# Patient Record
Sex: Male | Born: 1950
Health system: Southern US, Community
[De-identification: ages and names within clinical notes are randomized; demographics above are authoritative.]

## PROBLEM LIST (undated history)

## (undated) DIAGNOSIS — M549 Dorsalgia, unspecified: Secondary | ICD-10-CM

## (undated) DIAGNOSIS — M4802 Spinal stenosis, cervical region: Secondary | ICD-10-CM

## (undated) DIAGNOSIS — R4586 Emotional lability: Secondary | ICD-10-CM

## (undated) DIAGNOSIS — G4700 Insomnia, unspecified: Secondary | ICD-10-CM

## (undated) DIAGNOSIS — M5030 Other cervical disc degeneration, unspecified cervical region: Secondary | ICD-10-CM

## (undated) DIAGNOSIS — K635 Polyp of colon: Secondary | ICD-10-CM

## (undated) DIAGNOSIS — N529 Male erectile dysfunction, unspecified: Secondary | ICD-10-CM

## (undated) DIAGNOSIS — G4733 Obstructive sleep apnea (adult) (pediatric): Secondary | ICD-10-CM

## (undated) DIAGNOSIS — E739 Lactose intolerance, unspecified: Secondary | ICD-10-CM

## (undated) DIAGNOSIS — R7301 Impaired fasting glucose: Secondary | ICD-10-CM

## (undated) DIAGNOSIS — E781 Pure hyperglyceridemia: Secondary | ICD-10-CM

## (undated) DIAGNOSIS — E559 Vitamin D deficiency, unspecified: Secondary | ICD-10-CM

## (undated) DIAGNOSIS — I480 Paroxysmal atrial fibrillation: Secondary | ICD-10-CM

## (undated) DIAGNOSIS — R3121 Asymptomatic microscopic hematuria: Secondary | ICD-10-CM

## (undated) DIAGNOSIS — L989 Disorder of the skin and subcutaneous tissue, unspecified: Secondary | ICD-10-CM

## (undated) DIAGNOSIS — G4730 Sleep apnea, unspecified: Secondary | ICD-10-CM

## (undated) DIAGNOSIS — M5136 Other intervertebral disc degeneration, lumbar region: Secondary | ICD-10-CM

## (undated) DIAGNOSIS — G8929 Other chronic pain: Secondary | ICD-10-CM

## (undated) DIAGNOSIS — A4902 Methicillin resistant Staphylococcus aureus infection, unspecified site: Secondary | ICD-10-CM

## (undated) DIAGNOSIS — M51369 Other intervertebral disc degeneration, lumbar region without mention of lumbar back pain or lower extremity pain: Secondary | ICD-10-CM

## (undated) DIAGNOSIS — R002 Palpitations: Secondary | ICD-10-CM

## (undated) DIAGNOSIS — L270 Generalized skin eruption due to drugs and medicaments taken internally: Secondary | ICD-10-CM

## (undated) DIAGNOSIS — M1990 Unspecified osteoarthritis, unspecified site: Secondary | ICD-10-CM

## (undated) DIAGNOSIS — I499 Cardiac arrhythmia, unspecified: Secondary | ICD-10-CM

## (undated) HISTORY — DX: Vitamin D deficiency, unspecified: E55.9

## (undated) HISTORY — DX: Other cervical disc degeneration, unspecified cervical region: M50.30

## (undated) HISTORY — DX: Asymptomatic microscopic hematuria: R31.21

## (undated) HISTORY — DX: Impaired fasting glucose: R73.01

## (undated) HISTORY — DX: Insomnia, unspecified: G47.00

## (undated) HISTORY — DX: Obstructive sleep apnea (adult) (pediatric): G47.33

## (undated) HISTORY — DX: Pure hyperglyceridemia: E78.1

## (undated) HISTORY — DX: Paroxysmal atrial fibrillation: I48.0

## (undated) HISTORY — DX: Dorsalgia, unspecified: M54.9

## (undated) HISTORY — DX: Lactose intolerance, unspecified: E73.9

## (undated) HISTORY — DX: Other intervertebral disc degeneration, lumbar region: M51.36

## (undated) HISTORY — DX: Sleep apnea, unspecified: G47.30

## (undated) HISTORY — DX: Other intervertebral disc degeneration, lumbar region without mention of lumbar back pain or lower extremity pain: M51.369

## (undated) HISTORY — DX: Palpitations: R00.2

## (undated) HISTORY — PX: SHOULDER SURGERY: SHX246

## (undated) HISTORY — DX: Spinal stenosis, cervical region: M48.02

## (undated) HISTORY — DX: Emotional lability: R45.86

## (undated) HISTORY — DX: Generalized skin eruption due to drugs and medicaments taken internally: L27.0

## (undated) HISTORY — DX: Male erectile dysfunction, unspecified: N52.9

## (undated) HISTORY — DX: Polyp of colon: K63.5

## (undated) HISTORY — DX: Methicillin resistant Staphylococcus aureus infection, unspecified site: A49.02

## (undated) HISTORY — PX: OTHER SURGICAL HISTORY: SHX169

## (undated) HISTORY — DX: Other disorders of bilirubin metabolism: E80.6

## (undated) HISTORY — DX: Other chronic pain: G89.29

## (undated) HISTORY — DX: Disorder of the skin and subcutaneous tissue, unspecified: L98.9

## (undated) HISTORY — PX: BACK SURGERY: SHX140

---
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
## 2001-07-26 ENCOUNTER — Encounter: Payer: Self-pay | Admitting: Orthopedic Surgery

## 2001-07-28 ENCOUNTER — Encounter: Payer: Self-pay | Admitting: Orthopedic Surgery

## 2001-07-28 ENCOUNTER — Observation Stay (HOSPITAL_COMMUNITY): Admission: RE | Admit: 2001-07-28 | Discharge: 2001-07-29 | Payer: Self-pay | Admitting: Orthopedic Surgery

## 2003-06-02 ENCOUNTER — Emergency Department (HOSPITAL_COMMUNITY): Admission: EM | Admit: 2003-06-02 | Discharge: 2003-06-02 | Payer: Self-pay | Admitting: Family Medicine

## 2005-07-21 ENCOUNTER — Ambulatory Visit: Payer: Self-pay | Admitting: Professional

## 2005-07-31 ENCOUNTER — Ambulatory Visit: Payer: Self-pay | Admitting: Professional

## 2005-08-11 ENCOUNTER — Ambulatory Visit: Payer: Self-pay | Admitting: Professional

## 2005-08-25 ENCOUNTER — Ambulatory Visit: Payer: Self-pay | Admitting: Professional

## 2010-06-29 ENCOUNTER — Emergency Department (HOSPITAL_BASED_OUTPATIENT_CLINIC_OR_DEPARTMENT_OTHER)
Admission: EM | Admit: 2010-06-29 | Discharge: 2010-06-29 | Disposition: A | Discharge status: Home / Self Care | Payer: 59 | Attending: Emergency Medicine | Admitting: Emergency Medicine

## 2010-06-29 DIAGNOSIS — Y929 Unspecified place or not applicable: Secondary | ICD-10-CM | POA: Insufficient documentation

## 2010-06-29 DIAGNOSIS — R609 Edema, unspecified: Secondary | ICD-10-CM | POA: Insufficient documentation

## 2010-06-29 DIAGNOSIS — T63391A Toxic effect of venom of other spider, accidental (unintentional), initial encounter: Secondary | ICD-10-CM | POA: Insufficient documentation

## 2010-06-29 DIAGNOSIS — L02519 Cutaneous abscess of unspecified hand: Secondary | ICD-10-CM | POA: Insufficient documentation

## 2010-06-29 DIAGNOSIS — T6391XA Toxic effect of contact with unspecified venomous animal, accidental (unintentional), initial encounter: Secondary | ICD-10-CM | POA: Insufficient documentation

## 2010-06-29 DIAGNOSIS — R002 Palpitations: Secondary | ICD-10-CM | POA: Insufficient documentation

## 2010-06-29 DIAGNOSIS — M7989 Other specified soft tissue disorders: Secondary | ICD-10-CM | POA: Insufficient documentation

## 2013-11-21 ENCOUNTER — Encounter: Payer: Self-pay | Admitting: Internal Medicine

## 2015-05-28 DIAGNOSIS — H43812 Vitreous degeneration, left eye: Secondary | ICD-10-CM | POA: Diagnosis not present

## 2015-06-08 DIAGNOSIS — N390 Urinary tract infection, site not specified: Secondary | ICD-10-CM | POA: Diagnosis not present

## 2015-06-08 DIAGNOSIS — R8299 Other abnormal findings in urine: Secondary | ICD-10-CM | POA: Diagnosis not present

## 2015-06-08 DIAGNOSIS — Z0000 Encounter for general adult medical examination without abnormal findings: Secondary | ICD-10-CM | POA: Diagnosis not present

## 2015-06-08 DIAGNOSIS — Z125 Encounter for screening for malignant neoplasm of prostate: Secondary | ICD-10-CM | POA: Diagnosis not present

## 2015-06-08 DIAGNOSIS — E559 Vitamin D deficiency, unspecified: Secondary | ICD-10-CM | POA: Diagnosis not present

## 2015-06-08 DIAGNOSIS — R7301 Impaired fasting glucose: Secondary | ICD-10-CM | POA: Diagnosis not present

## 2015-06-15 DIAGNOSIS — R8299 Other abnormal findings in urine: Secondary | ICD-10-CM | POA: Diagnosis not present

## 2015-06-15 DIAGNOSIS — M5030 Other cervical disc degeneration, unspecified cervical region: Secondary | ICD-10-CM | POA: Diagnosis not present

## 2015-06-15 DIAGNOSIS — N390 Urinary tract infection, site not specified: Secondary | ICD-10-CM | POA: Diagnosis not present

## 2015-06-15 DIAGNOSIS — E781 Pure hyperglyceridemia: Secondary | ICD-10-CM | POA: Diagnosis not present

## 2015-06-15 DIAGNOSIS — R030 Elevated blood-pressure reading, without diagnosis of hypertension: Secondary | ICD-10-CM | POA: Diagnosis not present

## 2015-06-15 DIAGNOSIS — Z0000 Encounter for general adult medical examination without abnormal findings: Secondary | ICD-10-CM | POA: Diagnosis not present

## 2015-06-15 DIAGNOSIS — E559 Vitamin D deficiency, unspecified: Secondary | ICD-10-CM | POA: Diagnosis not present

## 2015-06-15 DIAGNOSIS — N529 Male erectile dysfunction, unspecified: Secondary | ICD-10-CM | POA: Diagnosis not present

## 2015-06-15 DIAGNOSIS — D126 Benign neoplasm of colon, unspecified: Secondary | ICD-10-CM | POA: Diagnosis not present

## 2015-06-15 DIAGNOSIS — R7301 Impaired fasting glucose: Secondary | ICD-10-CM | POA: Diagnosis not present

## 2015-06-15 DIAGNOSIS — Z1212 Encounter for screening for malignant neoplasm of rectum: Secondary | ICD-10-CM | POA: Diagnosis not present

## 2015-06-15 DIAGNOSIS — G4733 Obstructive sleep apnea (adult) (pediatric): Secondary | ICD-10-CM | POA: Diagnosis not present

## 2015-06-26 DIAGNOSIS — H5203 Hypermetropia, bilateral: Secondary | ICD-10-CM | POA: Diagnosis not present

## 2015-06-26 DIAGNOSIS — H524 Presbyopia: Secondary | ICD-10-CM | POA: Diagnosis not present

## 2015-09-20 DIAGNOSIS — M25552 Pain in left hip: Secondary | ICD-10-CM | POA: Diagnosis not present

## 2015-12-24 DIAGNOSIS — H2513 Age-related nuclear cataract, bilateral: Secondary | ICD-10-CM | POA: Diagnosis not present

## 2016-02-21 DIAGNOSIS — B078 Other viral warts: Secondary | ICD-10-CM | POA: Diagnosis not present

## 2016-06-19 DIAGNOSIS — E559 Vitamin D deficiency, unspecified: Secondary | ICD-10-CM | POA: Diagnosis not present

## 2016-06-19 DIAGNOSIS — Z0000 Encounter for general adult medical examination without abnormal findings: Secondary | ICD-10-CM | POA: Diagnosis not present

## 2016-06-19 DIAGNOSIS — R7301 Impaired fasting glucose: Secondary | ICD-10-CM | POA: Diagnosis not present

## 2016-06-19 DIAGNOSIS — Z125 Encounter for screening for malignant neoplasm of prostate: Secondary | ICD-10-CM | POA: Diagnosis not present

## 2016-06-26 DIAGNOSIS — N528 Other male erectile dysfunction: Secondary | ICD-10-CM | POA: Diagnosis not present

## 2016-06-26 DIAGNOSIS — R030 Elevated blood-pressure reading, without diagnosis of hypertension: Secondary | ICD-10-CM | POA: Diagnosis not present

## 2016-06-26 DIAGNOSIS — R7301 Impaired fasting glucose: Secondary | ICD-10-CM | POA: Diagnosis not present

## 2016-06-26 DIAGNOSIS — D126 Benign neoplasm of colon, unspecified: Secondary | ICD-10-CM | POA: Diagnosis not present

## 2016-06-26 DIAGNOSIS — Z0000 Encounter for general adult medical examination without abnormal findings: Secondary | ICD-10-CM | POA: Diagnosis not present

## 2016-06-26 DIAGNOSIS — M5030 Other cervical disc degeneration, unspecified cervical region: Secondary | ICD-10-CM | POA: Diagnosis not present

## 2016-06-26 DIAGNOSIS — R3121 Asymptomatic microscopic hematuria: Secondary | ICD-10-CM | POA: Diagnosis not present

## 2016-06-26 DIAGNOSIS — E559 Vitamin D deficiency, unspecified: Secondary | ICD-10-CM | POA: Diagnosis not present

## 2016-06-26 DIAGNOSIS — E781 Pure hyperglyceridemia: Secondary | ICD-10-CM | POA: Diagnosis not present

## 2016-06-26 DIAGNOSIS — Z1389 Encounter for screening for other disorder: Secondary | ICD-10-CM | POA: Diagnosis not present

## 2016-07-06 DIAGNOSIS — Z1212 Encounter for screening for malignant neoplasm of rectum: Secondary | ICD-10-CM | POA: Diagnosis not present

## 2016-07-06 DIAGNOSIS — Z1211 Encounter for screening for malignant neoplasm of colon: Secondary | ICD-10-CM | POA: Diagnosis not present

## 2016-07-10 DIAGNOSIS — Z1212 Encounter for screening for malignant neoplasm of rectum: Secondary | ICD-10-CM | POA: Diagnosis not present

## 2016-07-10 LAB — IFOBT (OCCULT BLOOD): IFOBT: NEGATIVE

## 2016-07-12 LAB — COLOGUARD

## 2016-07-15 ENCOUNTER — Encounter: Payer: Self-pay | Admitting: Gastroenterology

## 2016-07-15 ENCOUNTER — Encounter (INDEPENDENT_AMBULATORY_CARE_PROVIDER_SITE_OTHER): Payer: Self-pay

## 2016-07-15 ENCOUNTER — Ambulatory Visit (INDEPENDENT_AMBULATORY_CARE_PROVIDER_SITE_OTHER): Payer: 59 | Admitting: Gastroenterology

## 2016-07-15 VITALS — BP 156/86 | HR 80 | Ht 71.65 in | Wt 242.5 lb

## 2016-07-15 DIAGNOSIS — R195 Other fecal abnormalities: Secondary | ICD-10-CM

## 2016-07-15 NOTE — Patient Instructions (Addendum)
If you are age 67 or older, your body mass index should be between 23-30. Your Body mass index is 33.21 kg/m. If this is out of the aforementioned range listed, please consider follow up with your Primary Care Provider.  If you are age 47 or younger, your body mass index should be between 19-25. Your Body mass index is 33.21 kg/m. If this is out of the aformentioned range listed, please consider follow up with your Primary Care Provider.   You have been scheduled for a colonoscopy. Please follow written instructions given to you at your visit today.  Please pick up your prep supplies at the pharmacy within the next 1-3 days. If you use inhalers (even only as needed), please bring them with you on the day of your procedure.  You may have a light breakfast the morning of prep day (the day before the procedure).   You may choose from: eggs, toast, chicken noodle soup, crackers.   You should have your breakfast completed between 8:00 and 9:00 am.  Clear liquids only for the rest of the day on prep day and up until 3 hours before procedure.    Your physician has requested that you go to www.startemmi.com and enter the access code given to you at your visit today. This web site gives a general overview about your procedure. However, you should still follow specific instructions given to you by our office regarding your preparation for the procedure.  Thank you for choosing [facility]  Dr Wilfrid Lund III

## 2016-07-15 NOTE — Progress Notes (Signed)
[facility] Consult Note:  History: Kevin Griffith 07/15/2016  Referring physician: Jerlyn Ly, MD  Reason for consult/chief complaint: Positive cologuard and Lactose Intolerance (?)   Subjective  HPI:  subcm tubular adenoma 07/2002 Kevin Griffith). He did not want to have a surveillance colonoscopy, concerned about risks/benefit ratio. So PCP did cologuard.  Cologuard result positive. Denies overt rectal bleeding, abd pain or altered bowel habits.  ROS:  Review of Systems  Denies chest pain or dyspnea  Past Medical History: Past Medical History:  Diagnosis Date  . Back pain, chronic   . Cervical stenosis of spine   . DDD (degenerative disc disease), cervical   . ED (erectile dysfunction)   . Hypertriglyceridemia   . Insomnia disorder   . MRSA (methicillin resistant Staphylococcus aureus)   . Sleep apnea, obstructive   . Vitamin D deficiency      Past Surgical History: Past Surgical History:  Procedure Laterality Date  . APPENDECTOMY  1970  . [place], 07-2001     Family History: Family History  Problem Relation Age of Onset  . Cerebral aneurysm Mother   . Hypertension Mother   . Heart attack Sister 59    oldest sister  . Crohn's disease Brother   . Diabetes Mellitus II Sister   . Heart disease Sister   . Crohn's disease Brother     Social History: Social History   Social History  . Marital status: Married    Spouse name: Kevin Griffith  . Number of children: 2  . Years of education: N/A   Occupational History  . capital Programme researcher, broadcasting/film/video   Social History Main Topics  . Smoking status: Never Smoker  . Smokeless tobacco: Never Used  . Alcohol use Yes     Comment: occ wine or beer  . Drug use: No  . Sexual activity: Not Asked   Other Topics Concern  . None   Social History Narrative  . None    Allergies: Allergies  Allergen Reactions  . Acetaminophen Other (See Comments)    Increases blood  pressure   . Sulfa Antibiotics Rash    Outpatient Meds: Current Outpatient Prescriptions  Medication Sig Dispense Refill  . Ascorbic Acid (VITAMIN C PO) Take 3,000 mg by mouth daily.    . B Complex Vitamins (VITAMIN-B COMPLEX PO) Take 1 tablet by mouth daily.    . Cholecalciferol (VITAMIN D3) 10000 units capsule Take 10,000 Units by mouth daily.    Marland Kitchen GELATIN PO Take 1 tablet by mouth daily.    . Levomefolate Glucosamine (METHYLFOLATE PO) Take 1 tablet by mouth daily.    . Menaquinone-7 (VITAMIN K2 PO) Take 500 mcg by mouth daily.    . Methylsulfonylmethane (MSM PO) Take 1 tablet by mouth daily.    . Multiple Minerals (MINERAL COMPLEX PO) Take 1 tablet by mouth daily.    Marland Kitchen VITAMIN A PO Take 4,000 Units by mouth daily.    . vitamin B-12 (CYANOCOBALAMIN) 1000 MCG tablet Take 1,000 mcg by mouth daily.    . vitamin E 400 UNIT capsule Take 400 Units by mouth daily.     No current facility-administered medications for this visit.       ___________________________________________________________________ Objective   Exam:  BP (!) 156/86 (BP Location: Left Arm, Patient Position: Sitting, Cuff Size: Large)   Pulse 80   Ht 5' 11.65" (1.82 m) Comment: height measured without shoes  Wt 242 lb 8 oz (110  kg)   BMI 33.21 kg/m    General: this is a(n) overweight and well-appearing man   Eyes: sclera anicteric, no redness  ENT: oral mucosa moist without lesions, no cervical or supraclavicular lymphadenopathy, good dentition  CV: RRR without murmur, S1/S2, no JVD, no peripheral edema  Resp: clear to auscultation bilaterally, normal RR and effort noted  GI: soft, no tenderness, with active bowel sounds. No guarding or palpable organomegaly noted.  Skin; warm and dry, no rash or jaundice noted  Neuro: awake, alert and oriented x 3. Normal gross motor function and fluent speech  Labs:  Recent CBC and CMP normal  Assessment: Encounter Diagnosis  Name Primary?  . Positive fecal  immunochemical test Yes    He is agreeeable to colonoscopy after a thorough discussion of risks/benefits.  The benefits and risks of the planned procedure were described in detail with the patient or (when appropriate) their health care proxy.  Risks were outlined as including, but not limited to, bleeding, infection, perforation, adverse medication reaction leading to cardiac or pulmonary decompensation, or pancreatitis (if ERCP).  The limitation of incomplete mucosal visualization was also discussed.  No guarantees or warranties were given.   Plan:  Colonoscopy   Thank you for the courtesy of this consult.  Please call me with any questions or concerns.  Kevin Griffith  CC: Kevin Ly, MD

## 2016-07-24 ENCOUNTER — Encounter: Payer: Self-pay | Admitting: Gastroenterology

## 2016-07-31 ENCOUNTER — Telehealth: Payer: Self-pay | Admitting: Gastroenterology

## 2016-07-31 ENCOUNTER — Other Ambulatory Visit: Payer: Self-pay

## 2016-07-31 MED ORDER — NA SULFATE-K SULFATE-MG SULF 17.5-3.13-1.6 GM/177ML PO SOLN: 1.0000 | Freq: Once | ORAL | 0 refills | Status: AC

## 2016-07-31 NOTE — Telephone Encounter (Signed)
RX sent as directed.

## 2016-08-07 ENCOUNTER — Encounter: Payer: Self-pay | Admitting: Gastroenterology

## 2016-08-07 ENCOUNTER — Ambulatory Visit (AMBULATORY_SURGERY_CENTER): Payer: 59 | Admitting: Gastroenterology

## 2016-08-07 VITALS — BP 136/83 | HR 58 | Temp 98.6°F | Resp 13 | Ht 71.0 in | Wt 242.0 lb

## 2016-08-07 DIAGNOSIS — D123 Benign neoplasm of transverse colon: Secondary | ICD-10-CM

## 2016-08-07 DIAGNOSIS — R195 Other fecal abnormalities: Secondary | ICD-10-CM | POA: Diagnosis not present

## 2016-08-07 DIAGNOSIS — D120 Benign neoplasm of cecum: Secondary | ICD-10-CM

## 2016-08-07 MED ORDER — SODIUM CHLORIDE 0.9 % IV SOLN: 500.0000 mL | INTRAVENOUS | Status: AC

## 2016-08-07 NOTE — Progress Notes (Signed)
Called to room to assist during endoscopic procedure.  Patient ID and intended procedure confirmed with present staff. Received instructions for my participation in the procedure from the performing physician.  

## 2016-08-07 NOTE — Op Note (Signed)
[facility] Patient Name: Kevin Griffith Procedure Date: 08/07/2016 3:05 PM MRN: [id number] Endoscopist: Mallie Mussel L. Loletha Carrow , MD Age: 66 Referring MD:  Date of Birth: 1950/11/30 Gender: Male Account #: [id number] Procedure:                Colonoscopy Indications:              Positive Cologuard test Medicines:                Monitored Anesthesia Care Procedure:                Pre-Anesthesia Assessment:                           - Prior to the procedure, a History and Physical                            was performed, and patient medications and                            allergies were reviewed. The patient's tolerance of                            previous anesthesia was also reviewed. The risks                            and benefits of the procedure and the sedation                            options and risks were discussed with the patient.                            All questions were answered, and informed consent                            was obtained. Prior Anticoagulants: The patient has                            taken no previous anticoagulant or antiplatelet                            agents. ASA Grade Assessment: I - A normal, healthy                            patient. After reviewing the risks and benefits,                            the patient was deemed in satisfactory condition to                            undergo the procedure.                           After obtaining informed consent, the colonoscope  was passed under direct vision. Throughout the                            procedure, the patient's blood pressure, pulse, and                            oxygen saturations were monitored continuously. The                            Colonoscope was introduced through the anus and                            advanced to the the cecum, identified by                            appendiceal orifice and ileocecal valve. The            colonoscopy was performed without difficulty. The                            patient tolerated the procedure well. The quality                            of the bowel preparation was good. The ileocecal                            valve, appendiceal orifice, and rectum were                            photographed. The quality of the bowel preparation                            was evaluated using the BBPS [facility] Bowel                            Preparation Scale) with scores of: Right Colon = 2,                            Transverse Colon = 3 and Left Colon = 2. The total                            BBPS score equals 7. The bowel preparation used was                            SUPREP. Scope In: 3:18:49 PM Scope Out: 3:47:13 PM Scope Withdrawal Time: 0 hours 26 minutes 11 seconds  Total Procedure Duration: 0 hours 28 minutes 24 seconds  Findings:                 The perianal and digital rectal examinations were                            normal.  Two sessile polyps were found in the cecum. The                            polyps were 5 mm in size. These polyps were removed                            with a cold snare. Resection and retrieval were                            complete.                           A 12 mm polyp was found in the cecum. The polyp was                            sessile (probable SSP by appearance). The polyp was                            removed with a piecemeal technique using both hot                            and cold snare. A few remaining edges of polyp                            tissue were removed with a cold biopsy forceps.                            Resection and retrieval were complete.                           A 5 mm polyp was found in the proximal transverse                            colon. The polyp was sessile. The polyp was removed                            with a cold snare. Resection and retrieval were                             complete.                           Multiple diverticula were found in the left colon.                           The exam was otherwise without abnormality on                            direct and retroflexion views. Complications:            No immediate complications. Estimated Blood Loss:     Estimated blood loss was minimal. Impression:               - Two 5 mm  polyps in the cecum, removed with a cold                            snare. Resected and retrieved.                           - One 12 mm polyp in the cecum, removed piecemeal                            using a cold biopsy forceps, hot snare and cold                            snare. Resected and retrieved.                           - One 5 mm polyp in the proximal transverse colon,                            removed with a cold snare. Resected and retrieved.                           - Diverticulosis in the left colon.                           - The examination was otherwise normal on direct                            and retroflexion views. Recommendation:           - Patient has a contact number available for                            emergencies. The signs and symptoms of potential                            delayed complications were discussed with the                            patient. Return to normal activities tomorrow.                            Written discharge instructions were provided to the                            patient.                           - Resume previous diet.                           - Continue present medications.                           - No aspirin, ibuprofen, naproxen, or other  non-steroidal anti-inflammatory drugs for 5 days                            after polyp removal.                           - Await pathology results.                           - Repeat colonoscopy is recommended for                            surveillance. The  colonoscopy date will be                            determined after pathology results from today's                            exam become available for review. Talmadge Ganas L. Loletha Carrow, MD 08/07/2016 3:57:20 PM This report has been signed electronically.

## 2016-08-07 NOTE — Patient Instructions (Signed)
Handout given on polyps  YOU HAD AN ENDOSCOPIC PROCEDURE TODAY: Refer to the procedure report and other information in the discharge instructions given to you for any specific questions about what was found during the examination. If this information does not answer your questions, please call [facility] office at [phone] to clarify.   YOU SHOULD EXPECT: Some feelings of bloating in the abdomen. Passage of more gas than usual. Walking can help get rid of the air that was put into your GI tract during the procedure and reduce the bloating. If you had a lower endoscopy (such as a colonoscopy or flexible sigmoidoscopy) you may notice spotting of blood in your stool or on the toilet paper. Some abdominal soreness may be present for a day or two, also.  DIET: Your first meal following the procedure should be a light meal and then it is ok to progress to your normal diet. A half-sandwich or bowl of soup is an example of a good first meal. Heavy or fried foods are harder to digest and may make you feel nauseous or bloated. Drink plenty of fluids but you should avoid alcoholic beverages for 24 hours. If you had a esophageal dilation, please see attached instructions for diet.    ACTIVITY: Your care partner should take you home directly after the procedure. You should plan to take it easy, moving slowly for the rest of the day. You can resume normal activity the day after the procedure however YOU SHOULD NOT DRIVE, use power tools, machinery or perform tasks that involve climbing or major physical exertion for 24 hours (because of the sedation medicines used during the test).   SYMPTOMS TO REPORT IMMEDIATELY: A gastroenterologist can be reached at any hour. Please call [phone]  for any of the following symptoms:  Following lower endoscopy (colonoscopy, flexible sigmoidoscopy) Excessive amounts of blood in the stool  Significant tenderness, worsening of abdominal pains  Swelling of the abdomen that is  new, acute  Fever of 100 or higher    FOLLOW UP:  If any biopsies were taken you will be contacted by phone or by letter within the next 1-3 weeks. Call [phone]  if you have not heard about the biopsies in 3 weeks.  Please also call with any specific questions about appointments or follow up tests.  

## 2016-08-07 NOTE — Progress Notes (Signed)
Pt's states no medical or surgical changes since previsit or office visit. 

## 2016-08-07 NOTE — Progress Notes (Signed)
Report given to PACU, vss 

## 2016-08-17 ENCOUNTER — Encounter: Payer: Self-pay | Admitting: Gastroenterology

## 2016-08-18 ENCOUNTER — Ambulatory Visit: Payer: Self-pay | Admitting: Gastroenterology

## 2016-09-01 DIAGNOSIS — S40011A Contusion of right shoulder, initial encounter: Secondary | ICD-10-CM | POA: Diagnosis not present

## 2016-09-15 DIAGNOSIS — M25511 Pain in right shoulder: Secondary | ICD-10-CM | POA: Diagnosis not present

## 2016-10-15 DIAGNOSIS — M25511 Pain in right shoulder: Secondary | ICD-10-CM | POA: Diagnosis not present

## 2016-10-20 DIAGNOSIS — S46011D Strain of muscle(s) and tendon(s) of the rotator cuff of right shoulder, subsequent encounter: Secondary | ICD-10-CM | POA: Diagnosis not present

## 2016-10-20 DIAGNOSIS — S40011D Contusion of right shoulder, subsequent encounter: Secondary | ICD-10-CM | POA: Diagnosis not present

## 2016-11-04 DIAGNOSIS — M19011 Primary osteoarthritis, right shoulder: Secondary | ICD-10-CM | POA: Diagnosis not present

## 2016-11-04 DIAGNOSIS — M659 Synovitis and tenosynovitis, unspecified: Secondary | ICD-10-CM | POA: Diagnosis not present

## 2016-11-04 DIAGNOSIS — M25511 Pain in right shoulder: Secondary | ICD-10-CM | POA: Diagnosis not present

## 2016-11-04 DIAGNOSIS — M7541 Impingement syndrome of right shoulder: Secondary | ICD-10-CM | POA: Diagnosis not present

## 2016-11-04 DIAGNOSIS — S46111A Strain of muscle, fascia and tendon of long head of biceps, right arm, initial encounter: Secondary | ICD-10-CM | POA: Diagnosis not present

## 2016-11-04 DIAGNOSIS — M75121 Complete rotator cuff tear or rupture of right shoulder, not specified as traumatic: Secondary | ICD-10-CM | POA: Diagnosis not present

## 2016-11-04 DIAGNOSIS — Z791 Long term (current) use of non-steroidal anti-inflammatories (NSAID): Secondary | ICD-10-CM | POA: Diagnosis not present

## 2016-11-04 DIAGNOSIS — S43431A Superior glenoid labrum lesion of right shoulder, initial encounter: Secondary | ICD-10-CM | POA: Diagnosis not present

## 2016-11-04 DIAGNOSIS — G8918 Other acute postprocedural pain: Secondary | ICD-10-CM | POA: Diagnosis not present

## 2016-11-04 DIAGNOSIS — S40011A Contusion of right shoulder, initial encounter: Secondary | ICD-10-CM | POA: Diagnosis not present

## 2016-11-04 DIAGNOSIS — M7521 Bicipital tendinitis, right shoulder: Secondary | ICD-10-CM | POA: Diagnosis not present

## 2016-11-04 DIAGNOSIS — S46091A Other injury of muscle(s) and tendon(s) of the rotator cuff of right shoulder, initial encounter: Secondary | ICD-10-CM | POA: Diagnosis not present

## 2016-11-04 DIAGNOSIS — M24111 Other articular cartilage disorders, right shoulder: Secondary | ICD-10-CM | POA: Diagnosis not present

## 2016-11-04 DIAGNOSIS — M94211 Chondromalacia, right shoulder: Secondary | ICD-10-CM | POA: Diagnosis not present

## 2016-11-04 DIAGNOSIS — Z7901 Long term (current) use of anticoagulants: Secondary | ICD-10-CM | POA: Diagnosis not present

## 2016-11-04 DIAGNOSIS — I890 Lymphedema, not elsewhere classified: Secondary | ICD-10-CM | POA: Diagnosis not present

## 2016-11-15 DIAGNOSIS — Z791 Long term (current) use of non-steroidal anti-inflammatories (NSAID): Secondary | ICD-10-CM | POA: Diagnosis not present

## 2016-11-15 DIAGNOSIS — I890 Lymphedema, not elsewhere classified: Secondary | ICD-10-CM | POA: Diagnosis not present

## 2016-11-15 DIAGNOSIS — S40011A Contusion of right shoulder, initial encounter: Secondary | ICD-10-CM | POA: Diagnosis not present

## 2016-11-15 DIAGNOSIS — M7541 Impingement syndrome of right shoulder: Secondary | ICD-10-CM | POA: Diagnosis not present

## 2016-11-15 DIAGNOSIS — M19011 Primary osteoarthritis, right shoulder: Secondary | ICD-10-CM | POA: Diagnosis not present

## 2016-11-15 DIAGNOSIS — M25511 Pain in right shoulder: Secondary | ICD-10-CM | POA: Diagnosis not present

## 2016-11-15 DIAGNOSIS — Z7901 Long term (current) use of anticoagulants: Secondary | ICD-10-CM | POA: Diagnosis not present

## 2016-11-15 DIAGNOSIS — S43431A Superior glenoid labrum lesion of right shoulder, initial encounter: Secondary | ICD-10-CM | POA: Diagnosis not present

## 2016-12-17 ENCOUNTER — Ambulatory Visit: Payer: 59 | Attending: Orthopedic Surgery

## 2016-12-17 DIAGNOSIS — M25511 Pain in right shoulder: Secondary | ICD-10-CM | POA: Diagnosis not present

## 2016-12-17 DIAGNOSIS — M6281 Muscle weakness (generalized): Secondary | ICD-10-CM | POA: Diagnosis not present

## 2016-12-17 DIAGNOSIS — M25611 Stiffness of right shoulder, not elsewhere classified: Secondary | ICD-10-CM | POA: Insufficient documentation

## 2016-12-17 NOTE — Therapy (Signed)
[facility] [address], [place], [place], [address] Phone: [phone]   Fax:  [phone]  Physical Therapy Evaluation  Patient Details  Name: Kevin Griffith MRN: [id number] Date of Birth: 1951/02/09 Referring Provider: Justice Britain, MD  Encounter Date: 12/17/2016      PT End of Session - 12/17/16 0842    Visit Number 1   Number of Visits 10   Date for PT Re-Evaluation 02/11/17   PT Start Time 0800   PT Stop Time 0843   PT Time Calculation (min) 43 min   Activity Tolerance Patient tolerated treatment well   Behavior During Therapy [facility] for tasks assessed/performed      Past Medical History:  Diagnosis Date  . Back pain, chronic   . Cervical stenosis of spine   . DDD (degenerative disc disease), cervical   . ED (erectile dysfunction)   . Hypertriglyceridemia   . Insomnia disorder   . MRSA (methicillin resistant Staphylococcus aureus)   . Sleep apnea, obstructive   . Vitamin D deficiency     Past Surgical History:  Procedure Laterality Date  . APPENDECTOMY  1970  . [place], 07-2001    There were no vitals filed for this visit.       Subjective Assessment - 12/17/16 0812    Subjective Pt presents to PT s/p Rt shoulder surgery RTC repair.  Pt had injury while mowing grass when he fell and landed on Rt shoulder.  Surgery was 7/101/18.   Pertinent History Rt RTC repair, SAD, DCR 11/04/16   Patient Stated Goals improve Rt shoulder strength, return to regular use of Rt UE   Currently in Pain? Yes   Pain Score 0-No pain  up to 2-3/10   Pain Location Shoulder   Pain Orientation Right   Pain Descriptors / Indicators Sore   Pain Type Surgical pain   Pain Onset More than a month ago   Pain Frequency Intermittent   Aggravating Factors  walking a lot, activity   Pain Relieving Factors rest, ibuprofen            [facility] PT Assessment - 12/17/16 0001      Assessment   Medical Diagnosis s/p  Rt shoulder scope, SAD, DCR, RCR   Referring Provider Justice Britain, MD   Onset Date/Surgical Date 11/04/16   Hand Dominance Right   Next MD Visit 01/07/17   Prior Therapy none     Precautions   Precautions Shoulder   Type of Shoulder Precautions follow post-op protocol   Required Braces or Orthoses Sling  wean at 6 months     Restrictions   Weight Bearing Restrictions No     Balance Screen   Has the patient fallen in the past 6 months No   Has the patient had a decrease in activity level because of a fear of falling?  No   Is the patient reluctant to leave their home because of a fear of falling?  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   Leisure playing golf     Cognition   Overall Cognitive Status Within Functional Limits for tasks assessed     Observation/Other Assessments   Focus on Therapeutic  Outcomes (FOTO)  43% limitation     Posture/Postural Control   Posture/Postural Control Postural limitations   Postural Limitations Forward head;Rounded Shoulders     ROM / Strength   AROM / PROM / Strength AROM;PROM;Strength     AROM   Overall AROM  Deficits   Overall AROM Comments Lt shoulder A/ROM is full   AROM Assessment Site Shoulder   Right/Left Shoulder Right   Right Shoulder Flexion 83 Degrees  scapular elevation   Right Shoulder ABduction 70 Degrees  scapular elevation     PROM   Overall PROM  Deficits   PROM Assessment Site Shoulder   Right/Left Shoulder Right   Right Shoulder Flexion 118 Degrees   Right Shoulder ABduction 85 Degrees   Right Shoulder Internal Rotation 35 Degrees   Right Shoulder External Rotation 40 Degrees     Strength   Overall Strength Unable to assess;Due to precautions     Palpation   Palpation comment well  healing surgicial incisions, palpable tenderness over anterior Rt shoulder at biceps insertion and posterior shoulder at ER     Ambulation/Gait   Gait Pattern Within Functional Limits            Objective measurements completed on examination: See above findings.                  PT Education - 12/17/16 0841    Education provided Yes   Education Details sling exercises, AAROM   Person(s) Educated Patient   Methods Explanation;Demonstration;Handout   Comprehension Verbalized understanding;Returned demonstration          PT Short Term Goals - 12/17/16 0816      PT SHORT TERM GOAL #1   Title be independent in initial HEP   Time 4   Period Weeks   Status New     PT SHORT TERM GOAL #2   Title demonstrate Rt shoulder A/ROM flexion to > or = to 110 degrees without scapular elevation   Time 4   Period Weeks   Status New     PT SHORT TERM GOAL #3   Title wean from sling 100% and report use of Rt UE for light ADLs and self-care   Time 4   Period Weeks   Status New     PT SHORT TERM GOAL #4   Title report < or = to 2/10 Rt shoulder pain with use   Time 4   Period Weeks   Status New           PT Long Term Goals - 12/17/16 0756      PT LONG TERM GOAL #1   Title be independent in advanced HEP   Time 8   Period Weeks   Status New     PT LONG TERM GOAL #2   Title reduce FOTO to < or = to 27% limitation   Time 8   Period Weeks   Status New     PT LONG TERM GOAL #3   Title demonstrate Rt shoulder A/ROM flexion to > or = to 140 degrees without scapular elevation   Time 8   Period Weeks   Status New     PT LONG TERM GOAL #4   Title demonstrate 4 to 4+/5 Rt shoulder strength to improve endurance with use   Time 8   Period Weeks   Status New     PT LONG TERM GOAL #5   Title demonstrate Rt shoulder P/ROM IR and ER to >  or = to 60 degrees to improve mobility   Time 8   Period Weeks   Status New     Additional Long Term Goals   Additional  Long Term Goals Yes     PT LONG TERM GOAL #6   Title report > 75% normal use of Rt UE without pain    Time 8   Period Weeks   Status New                Plan - 12/17/16 1601    Clinical Impression Statement Pt presents to PT s/p Rt RTC repair with decompression and distal clavicle resection performed 11/04/16 due to injury when he fell.  Pt has been wearing sling since surgery and is now in week 5-8 of post-op protocol.  Pt demonstrates reduced strength in Rt shoulder with scapular elevation with movement against gravity, limited Rt shoulder A/ROM  and P/ROM.  Pt with forward shoulder posture.  Pt will benefit from skilled PT for safe progression of post-op protocol for use of Rt UE for overhead use, lifting and daily activity.     Clinical Presentation Stable   Rehab Potential Good   PT Frequency 2x / week   PT Duration 8 weeks   PT Treatment/Interventions ADLs/Self Care Home Management;Cryotherapy;Electrical Stimulation;Functional mobility training;Moist Heat;Ultrasound;Therapeutic activities;Therapeutic exercise;Neuromuscular re-education;Patient/family education;Passive range of motion;Manual techniques;Taping   PT Next Visit Plan follow post-op protocol.  Begin isometrics   Consulted and Agree with Plan of Care Patient      Patient will benefit from skilled therapeutic intervention in order to improve the following deficits and impairments:  Postural dysfunction, Decreased strength, Impaired flexibility, Hypomobility, Pain, Decreased activity tolerance, Decreased endurance, Increased muscle spasms, Decreased range of motion  Visit Diagnosis: Acute pain of right shoulder - Plan: PT plan of care cert/re-cert  Stiffness of right shoulder, not elsewhere classified - Plan: PT plan of care cert/re-cert  Muscle weakness (generalized) - Plan: PT plan of care cert/re-cert      G-Codes - 09/32/35 0818    Functional Assessment Tool Used (Outpatient Only) FOTO: 43% limitation    Functional Limitation Other PT primary   Other PT Primary Current Status (T7322) At least 40 percent but less than 60 percent impaired, limited or restricted   Other PT Primary Goal Status (G2542) At least 20 percent but less than 40 percent impaired, limited or restricted       Problem List There are no active problems to display for this patient.    Sigurd Sos, PT 12/17/16 9:07 AM  [facility] [address], [place], [place], [address] Phone: [phone]   Fax:  [phone]  Name: Kevin Griffith MRN: [id number] Date of Birth: [birth year]

## 2016-12-17 NOTE — Patient Instructions (Signed)
Hold all stretches 5-10 seconds and perform 5-10 times, 3 times a day.    Sitting upright, slide forearm forward along table, bending from the waist until a stretch is felt. Copyright  VHI. All rights reserved.    Clasp hands together and raise arms above head, keeping elbows as straight as possible. Can be done sitting or lying.   Copyright  VHI. All rights reserved.  ROM: Pendulum (Circular)  Let right arm move in circle clockwise, then counterclockwise, by rocking body weight in circular pattern. Circle _10___ times each direction per set. Do _3___ sessions per day.  Pendulum Side to Side  Bend forward 90 at waist, leaning on table for support. Rock body from side to side and let arm swing freely. Repeat _10___ times. Do __3__ sessions per day.  Finger Flexors  Keeping right fingertips straight, press putty toward base of palm. Repeat __20__ times. Do __3__ sessions per day. Activity: Squeeze flour sifter, plastic squeeze bottles, [place] baster, juice from fruit.*  AROM: Elbow Flexion / Extension  With left hand palm up, gently bend elbow as far as possible. Then straighten arm as far as possible. Repeat __10__ times per set. Do __3__ sessions per day.     Elevation: Shrug (Distal Resist)  Also add circles forward and back   Lift shoulders straight up, then return. Maintain same speed up and down. Avoid moving head and neck forward. Repeat _10___ times per set. Do __1-2__ sets per session. Do many times daily. Copyright  VHI. All rights reserved.   [facility] [address], [place], [place] [address] Phone # [phone] Fax [phone]

## 2016-12-19 ENCOUNTER — Ambulatory Visit: Payer: 59 | Admitting: Physical Therapy

## 2016-12-19 DIAGNOSIS — M25511 Pain in right shoulder: Secondary | ICD-10-CM | POA: Diagnosis not present

## 2016-12-19 DIAGNOSIS — M25611 Stiffness of right shoulder, not elsewhere classified: Secondary | ICD-10-CM | POA: Diagnosis not present

## 2016-12-19 DIAGNOSIS — M6281 Muscle weakness (generalized): Secondary | ICD-10-CM

## 2016-12-19 NOTE — Patient Instructions (Signed)
   Strengthening: Isometric Flexion  Using wall for resistance, press right fist into ball using light pressure. Hold __5__ seconds. Repeat _5___ times per set. Do _1___ sets per session. Do __2-3__ sessions per day.  SHOULDER: Abduction (Isometric)  Use wall as resistance. Press arm against pillow. Keep elbow straight. Hold _5__ seconds. __5_ reps per set, __1_ sets per day, __7_ days per week  Extension (Isometric)  Place left bent elbow and back of arm against wall. Press elbow against wall. Hold __5__ seconds. Repeat __5__ times. Do __2-3__ sessions per day.  Internal Rotation (Isometric)  Place palm of right fist against door frame, with elbow bent. Press fist against door frame. Hold _5___ seconds. Repeat _5___ times. Do __2-3__ sessions per day.5  External Rotation (Isometric)  Place back of left fist against door frame, with elbow bent. Press fist against door frame. Hold _5___ seconds. Repeat __5__ times. Do ____2-3 sessions per day.  Copyright  VHI. All rights reserved.     Ruben Im PT [facility] [address], [place], [place] [address] Phone # [phone] Fax [phone]

## 2016-12-19 NOTE — Therapy (Signed)
[facility] [address] [place], [place], [address] Phone: [phone]   Fax:  [phone]  Physical Therapy Treatment  Patient Details  Name: Kevin Griffith MRN: [id number] Date of Birth: 19-Nov-1950 Referring Provider: Justice Britain, MD  Encounter Date: 12/19/2016      PT End of Session - 12/19/16 1158    Visit Number 2   Number of Visits 10   PT Start Time 1245   PT Stop Time 1100   PT Time Calculation (min) 45 min   Activity Tolerance Patient tolerated treatment well      Past Medical History:  Diagnosis Date  . Back pain, chronic   . Cervical stenosis of spine   . DDD (degenerative disc disease), cervical   . ED (erectile dysfunction)   . Hypertriglyceridemia   . Insomnia disorder   . MRSA (methicillin resistant Staphylococcus aureus)   . Sleep apnea, obstructive   . Vitamin D deficiency     Past Surgical History:  Procedure Laterality Date  . APPENDECTOMY  1970  . [place], 07-2001    There were no vitals filed for this visit.      Subjective Assessment - 12/19/16 1019    Subjective It hurt like hell the first time.  I did walk and my arm freed up.  With movement pain 1-2/10.  No pain with no movement.   Pertinent History Rt RTC repair, SAD, DCR 11/04/16   Currently in Pain? Yes   Pain Score 1    Pain Location Shoulder   Pain Orientation Right   Pain Type Surgical pain   Pain Onset More than a month ago   Aggravating Factors  any activity   Pain Relieving Factors rest, ibuprofen                         [facility] Adult PT Treatment/Exercise - 12/19/16 0001      Therapeutic Activites    Therapeutic Activities ADL's   ADL's to prepare for return to right UE use with ADLs     Neuro Re-ed    Neuro Re-ed Details  deltoid activation, periscapular muscle activation     Shoulder Exercises: Supine   Protraction AROM;Right;10 reps   Flexion AAROM;Right;10 reps  with  UE Ranger     Shoulder Exercises: Standing   Row Strengthening;Right;15 reps;Theraband   Theraband Level (Shoulder Row) Level 1 (Yellow)     Shoulder Exercises: ROM/Strengthening   Other ROM/Strengthening Exercises UE Ranger seated 20x    Other ROM/Strengthening Exercises UE Ranger on mat table 15x     Shoulder Exercises: Isometric Strengthening   Flexion 5X5"   Extension 5X5"   External Rotation 5X5"   Internal Rotation 5X5"   ABduction 5X5"     Manual Therapy   Manual Therapy Passive ROM   Passive ROM supine flexion 2x10, abduction to 90 degrees 10x, external rotation to 45 degrees 10x                PT Education - 12/19/16 1051    Education provided Yes   Education Details shoulder isometrics   Person(s) Educated Patient   Methods Explanation;Demonstration;Handout   Comprehension Verbalized understanding;Returned demonstration          PT Short Term Goals - 12/19/16 1203      PT SHORT TERM GOAL #1   Title be independent in initial HEP   Time 4   Period Weeks  Status On-going     PT SHORT TERM GOAL #2   Title demonstrate Rt shoulder A/ROM flexion to > or = to 110 degrees without scapular elevation   Time 4   Period Weeks   Status On-going     PT SHORT TERM GOAL #3   Title wean from sling 100% and report use of Rt UE for light ADLs and self-care   Time 4   Period Weeks   Status On-going     PT SHORT TERM GOAL #4   Title report < or = to 2/10 Rt shoulder pain with use   Time 4   Period Weeks   Status On-going           PT Long Term Goals - 12/19/16 1203      PT LONG TERM GOAL #1   Title be independent in advanced HEP   Time 8   Period Weeks   Status On-going     PT LONG TERM GOAL #2   Title reduce FOTO to < or = to 27% limitation   Period Weeks   Status On-going     PT LONG TERM GOAL #3   Title demonstrate Rt shoulder A/ROM flexion to > or = to 140 degrees without scapular elevation   Time 8   Period Weeks   Status On-going      PT LONG TERM GOAL #4   Title demonstrate 4 to 4+/5 Rt shoulder strength to improve endurance with use   Time 8   Period Weeks   Status On-going     PT LONG TERM GOAL #5   Title demonstrate Rt shoulder P/ROM IR and ER to > or = to 60 degrees to improve mobility   Time 8   Period Weeks   Status On-going     PT LONG TERM GOAL #6   Title report > 75% normal use of Rt UE without pain    Time 8   Period Weeks   Status On-going               Plan - 12/19/16 1158    Clinical Impression Statement The patient is 6 1/2 weeks s/p right rotator cuff repair.  He has just discontinued using his sling and reports he initially had a lot of pain with doing so but having going for some walks around the neighborhood, it has loosened up.  He has minimal pain today with active assisted ROM and isometrics.  Verbal and tactile cues to decrease compensatory shoulder hike and utilization of periscapular muscles.  He defers modalities in the clinic preferring to use his infrared light at home.     Rehab Potential Good   PT Frequency 2x / week   PT Duration 8 weeks   PT Treatment/Interventions ADLs/Self Care Home Management;Cryotherapy;Electrical Stimulation;Functional mobility training;Moist Heat;Ultrasound;Therapeutic activities;Therapeutic exercise;Neuromuscular re-education;Patient/family education;Passive range of motion;Manual techniques;Taping   PT Next Visit Plan follow post-op protocol;  active assisted ROM;  review isometrics as needed;  UE Ranger      Patient will benefit from skilled therapeutic intervention in order to improve the following deficits and impairments:  Postural dysfunction, Decreased strength, Impaired flexibility, Hypomobility, Pain, Decreased activity tolerance, Decreased endurance, Increased muscle spasms, Decreased range of motion  Visit Diagnosis: Acute pain of right shoulder  Stiffness of right shoulder, not elsewhere classified  Muscle weakness  (generalized)     Problem List There are no active problems to display for this patient.  Ruben Im, PT 12/19/16 12:07 PM  Phone: [phone] Fax: [phone]  Alvera Singh 12/19/2016, 12:06 PM  [facility] [address] [place], [place], [address] Phone: [phone]   Fax:  [phone]  Name: Kevin Griffith MRN: [id number] Date of Birth: 17-Oct-1950

## 2016-12-23 ENCOUNTER — Ambulatory Visit: Payer: 59 | Admitting: Physical Therapy

## 2016-12-23 DIAGNOSIS — M6281 Muscle weakness (generalized): Secondary | ICD-10-CM

## 2016-12-23 DIAGNOSIS — M25611 Stiffness of right shoulder, not elsewhere classified: Secondary | ICD-10-CM | POA: Diagnosis not present

## 2016-12-23 DIAGNOSIS — M25511 Pain in right shoulder: Secondary | ICD-10-CM | POA: Diagnosis not present

## 2016-12-23 NOTE — Therapy (Signed)
[facility] [address], [place], [place], [address] Phone: [phone]   Fax:  [phone]  Physical Therapy Treatment  Patient Details  Name: Kevin Griffith MRN: [id number] Date of Birth: Aug 29, 1950 Referring Provider: Justice Britain, MD  Encounter Date: 12/23/2016      PT End of Session - 12/23/16 0921    Visit Number 3   Number of Visits 10   Date for PT Re-Evaluation 02/11/17   PT Start Time 0845   PT Stop Time 0924   PT Time Calculation (min) 39 min   Activity Tolerance Patient tolerated treatment well   Behavior During Therapy [facility] for tasks assessed/performed      Past Medical History:  Diagnosis Date  . Back pain, chronic   . Cervical stenosis of spine   . DDD (degenerative disc disease), cervical   . ED (erectile dysfunction)   . Hypertriglyceridemia   . Insomnia disorder   . MRSA (methicillin resistant Staphylococcus aureus)   . Sleep apnea, obstructive   . Vitamin D deficiency     Past Surgical History:  Procedure Laterality Date  . APPENDECTOMY  1970  . [place], 07-2001    There were no vitals filed for this visit.      Subjective Assessment - 12/23/16 0848    Subjective shoulder has been more painful since PT sessions, but overall doing okay.  feels like numbness in fingers is better.   Pertinent History Rt RTC repair, SAD, DCR 11/04/16   Patient Stated Goals improve Rt shoulder strength, return to regular use of Rt UE   Currently in Pain? Yes   Pain Score 1    Pain Location Shoulder   Pain Orientation Right   Pain Descriptors / Indicators Sore   Pain Type Surgical pain   Pain Onset More than a month ago   Pain Frequency Intermittent   Aggravating Factors  any activity   Pain Relieving Factors rest, ibuprofen                         [facility] Adult PT Treatment/Exercise - 12/23/16 0851      Exercises   Exercises Shoulder     Shoulder Exercises: Supine    External Rotation Right;15 reps;AAROM   External Rotation Limitations with UE ranger   Flexion AAROM;Both;15 reps   Flexion Limitations with UE ranger     Shoulder Exercises: Standing   Row Strengthening;Both;10 reps;Theraband   Theraband Level (Shoulder Row) Level 1 (Yellow)   Row Limitations 3 sec hold     Shoulder Exercises: Pulleys   Flexion 3 minutes   ABduction 3 minutes   ABduction Limitations scaption     Shoulder Exercises: Isometric Strengthening   Flexion Supine   Flexion Limitations 10x5"   Extension Supine   Extension Limitations 10x5"   External Rotation Supine   External Rotation Limitations 10x5"   Internal Rotation Supine   Internal Rotation Limitations 10x5"   ABduction Supine   ABduction Limitations 10x5"   ADduction Supine   ADduction Limitations 10x5"     Manual Therapy   Manual Therapy Passive ROM   Passive ROM supine flexion 2x10, abduction to 90 degrees 10x, external rotation to 45 degrees 10x                  PT Short Term Goals - 12/19/16 1203      PT SHORT TERM GOAL #1   Title be independent  in initial HEP   Time 4   Period Weeks   Status On-going     PT SHORT TERM GOAL #2   Title demonstrate Rt shoulder A/ROM flexion to > or = to 110 degrees without scapular elevation   Time 4   Period Weeks   Status On-going     PT SHORT TERM GOAL #3   Title wean from sling 100% and report use of Rt UE for light ADLs and self-care   Time 4   Period Weeks   Status On-going     PT SHORT TERM GOAL #4   Title report < or = to 2/10 Rt shoulder pain with use   Time 4   Period Weeks   Status On-going           PT Long Term Goals - 12/19/16 1203      PT LONG TERM GOAL #1   Title be independent in advanced HEP   Time 8   Period Weeks   Status On-going     PT LONG TERM GOAL #2   Title reduce FOTO to < or = to 27% limitation   Period Weeks   Status On-going     PT LONG TERM GOAL #3   Title demonstrate Rt shoulder A/ROM  flexion to > or = to 140 degrees without scapular elevation   Time 8   Period Weeks   Status On-going     PT LONG TERM GOAL #4   Title demonstrate 4 to 4+/5 Rt shoulder strength to improve endurance with use   Time 8   Period Weeks   Status On-going     PT LONG TERM GOAL #5   Title demonstrate Rt shoulder P/ROM IR and ER to > or = to 60 degrees to improve mobility   Time 8   Period Weeks   Status On-going     PT LONG TERM GOAL #6   Title report > 75% normal use of Rt UE without pain    Time 8   Period Weeks   Status On-going               Plan - 12/23/16 6295    Clinical Impression Statement Pt tolerated session well today needing min cues for proper technique and cues for AAROM within pain tolerance.  Progressing well towards goals.   PT Treatment/Interventions ADLs/Self Care Home Management;Cryotherapy;Electrical Stimulation;Functional mobility training;Moist Heat;Ultrasound;Therapeutic activities;Therapeutic exercise;Neuromuscular re-education;Patient/family education;Passive range of motion;Manual techniques;Taping   PT Next Visit Plan follow post-op protocol;  active assisted ROM;  review isometrics as needed;  UE Ranger   Consulted and Agree with Plan of Care Patient      Patient will benefit from skilled therapeutic intervention in order to improve the following deficits and impairments:  Postural dysfunction, Decreased strength, Impaired flexibility, Hypomobility, Pain, Decreased activity tolerance, Decreased endurance, Increased muscle spasms, Decreased range of motion  Visit Diagnosis: Acute pain of right shoulder  Stiffness of right shoulder, not elsewhere classified  Muscle weakness (generalized)     Problem List There are no active problems to display for this patient.     Laureen Abrahams, PT, DPT 12/23/16 9:24 AM    [facility] [address] [place], [place],  [address] Phone: [phone]   Fax:  [phone]  Name: JAMORION GOMILLION MRN: [id number] Date of Birth: [birth year]

## 2016-12-26 ENCOUNTER — Ambulatory Visit: Payer: 59 | Admitting: Rehabilitation

## 2016-12-26 ENCOUNTER — Encounter: Payer: Self-pay | Admitting: Rehabilitation

## 2016-12-26 DIAGNOSIS — M25611 Stiffness of right shoulder, not elsewhere classified: Secondary | ICD-10-CM | POA: Diagnosis not present

## 2016-12-26 DIAGNOSIS — M6281 Muscle weakness (generalized): Secondary | ICD-10-CM | POA: Diagnosis not present

## 2016-12-26 DIAGNOSIS — M25511 Pain in right shoulder: Secondary | ICD-10-CM | POA: Diagnosis not present

## 2016-12-26 NOTE — Therapy (Signed)
[facility] [address], [place], [place], [address] Phone: [phone]   Fax:  [phone]  Physical Therapy Treatment  Patient Details  Name: Kevin Griffith MRN: [id number] Date of Birth: 21-Jul-1950 Referring Provider: Justice Britain, MD  Encounter Date: 12/26/2016      PT End of Session - 12/26/16 0908    Visit Number 4   Number of Visits 10   Date for PT Re-Evaluation 02/11/17   PT Start Time 0845   PT Stop Time 0928   PT Time Calculation (min) 43 min   Activity Tolerance Patient tolerated treatment well      Past Medical History:  Diagnosis Date  . Back pain, chronic   . Cervical stenosis of spine   . DDD (degenerative disc disease), cervical   . ED (erectile dysfunction)   . Hypertriglyceridemia   . Insomnia disorder   . MRSA (methicillin resistant Staphylococcus aureus)   . Sleep apnea, obstructive   . Vitamin D deficiency     Past Surgical History:  Procedure Laterality Date  . APPENDECTOMY  1970  . [place], 07-2001    There were no vitals filed for this visit.      Subjective Assessment - 12/26/16 0846    Subjective After tuesday's session it took until yesterday afternoon to recover.  Feels that the entrapped nerve has released but the thumb and first finger is still tingling.     Currently in Pain? Yes   Pain Score 1    Pain Location Shoulder   Pain Orientation Right   Pain Descriptors / Indicators Sore   Pain Type Surgical pain   Pain Onset More than a month ago   Aggravating Factors  any activity   Pain Relieving Factors rest, ibuprofen                         [facility] Adult PT Treatment/Exercise - 12/26/16 0001      Shoulder Exercises: Supine   External Rotation Right;15 reps;AAROM   External Rotation Limitations with UE ranger   Flexion AAROM;Right;10 reps   Flexion Limitations with UE ranger     Shoulder Exercises: Standing   Other Standing  Exercises shoulder isometrics; flexion, abduction, ER, IR, Ext 6"x6 each      Shoulder Exercises: Pulleys   Flexion 3 minutes   ABduction 3 minutes     Modalities   Modalities --     Vasopneumatic   Number Minutes Vasopneumatic  --   Vasopnuematic Location  --   Vasopneumatic Pressure --   Vasopneumatic Temperature  --     Manual Therapy   Manual Therapy Passive ROM   Passive ROM PROM to tolerance all directions                   PT Short Term Goals - 12/19/16 1203      PT SHORT TERM GOAL #1   Title be independent in initial HEP   Time 4   Period Weeks   Status On-going     PT SHORT TERM GOAL #2   Title demonstrate Rt shoulder A/ROM flexion to > or = to 110 degrees without scapular elevation   Time 4   Period Weeks   Status On-going     PT SHORT TERM GOAL #3   Title wean from sling 100% and report use of Rt UE for light ADLs and self-care   Time 4  Period Weeks   Status On-going     PT SHORT TERM GOAL #4   Title report < or = to 2/10 Rt shoulder pain with use   Time 4   Period Weeks   Status On-going           PT Long Term Goals - 12/19/16 1203      PT LONG TERM GOAL #1   Title be independent in advanced HEP   Time 8   Period Weeks   Status On-going     PT LONG TERM GOAL #2   Title reduce FOTO to < or = to 27% limitation   Period Weeks   Status On-going     PT LONG TERM GOAL #3   Title demonstrate Rt shoulder A/ROM flexion to > or = to 140 degrees without scapular elevation   Time 8   Period Weeks   Status On-going     PT LONG TERM GOAL #4   Title demonstrate 4 to 4+/5 Rt shoulder strength to improve endurance with use   Time 8   Period Weeks   Status On-going     PT LONG TERM GOAL #5   Title demonstrate Rt shoulder P/ROM IR and ER to > or = to 60 degrees to improve mobility   Time 8   Period Weeks   Status On-going     PT LONG TERM GOAL #6   Title report > 75% normal use of Rt UE without pain    Time 8   Period Weeks    Status On-going               Plan - 12/26/16 0908    Clinical Impression Statement Pt continues to have increased pain with PT session.  Reports he may be pushing it too hard in therapy.  Scaled back today and pt reports no pain upon leaving   Rehab Potential Good   PT Frequency 2x / week   PT Duration 8 weeks   PT Treatment/Interventions ADLs/Self Care Home Management;Cryotherapy;Electrical Stimulation;Functional mobility training;Moist Heat;Ultrasound;Therapeutic activities;Therapeutic exercise;Neuromuscular re-education;Patient/family education;Passive range of motion;Manual techniques;Taping   PT Next Visit Plan follow post-op protocol;  active assisted ROM;  review isometrics as needed;  UE Ranger   Consulted and Agree with Plan of Care Patient      Patient will benefit from skilled therapeutic intervention in order to improve the following deficits and impairments:  Postural dysfunction, Decreased strength, Impaired flexibility, Hypomobility, Pain, Decreased activity tolerance, Decreased endurance, Increased muscle spasms, Decreased range of motion  Visit Diagnosis: Acute pain of right shoulder  Stiffness of right shoulder, not elsewhere classified  Muscle weakness (generalized)     Problem List There are no active problems to display for this patient.   Stark Bray, DPT, CMP 12/26/2016, 9:29 AM  [facility] [address], [place], [place], [address] Phone: [phone]   Fax:  [phone]  Name: Kevin Griffith MRN: [id number] Date of Birth: February 21, 1951

## 2016-12-31 ENCOUNTER — Ambulatory Visit: Payer: 59 | Attending: Orthopedic Surgery

## 2016-12-31 DIAGNOSIS — M25511 Pain in right shoulder: Secondary | ICD-10-CM | POA: Insufficient documentation

## 2016-12-31 DIAGNOSIS — M6281 Muscle weakness (generalized): Secondary | ICD-10-CM | POA: Insufficient documentation

## 2016-12-31 DIAGNOSIS — M25611 Stiffness of right shoulder, not elsewhere classified: Secondary | ICD-10-CM | POA: Diagnosis not present

## 2016-12-31 NOTE — Therapy (Signed)
[facility] [address] [place], [place], [address] Phone: [phone]   Fax:  [phone]  Physical Therapy Treatment  Patient Details  Name: Kevin Griffith MRN: [id number] Date of Birth: 05/26/1950 Referring Provider: Justice Britain, MD  Encounter Date: 12/31/2016      PT End of Session - 12/31/16 0925    Visit Number 5   Number of Visits 10   Date for PT Re-Evaluation 02/11/17   PT Start Time 7322   PT Stop Time 0926   PT Time Calculation (min) 39 min   Activity Tolerance Patient tolerated treatment well   Behavior During Therapy [facility] for tasks assessed/performed      Past Medical History:  Diagnosis Date  . Back pain, chronic   . Cervical stenosis of spine   . DDD (degenerative disc disease), cervical   . ED (erectile dysfunction)   . Hypertriglyceridemia   . Insomnia disorder   . MRSA (methicillin resistant Staphylococcus aureus)   . Sleep apnea, obstructive   . Vitamin D deficiency     Past Surgical History:  Procedure Laterality Date  . APPENDECTOMY  1970  . [place], 07-2001    There were no vitals filed for this visit.      Subjective Assessment - 12/31/16 0843    Subjective I have soreness after I exercise.     Pertinent History Rt RTC repair, SAD, DCR 11/04/16   Patient Stated Goals improve Rt shoulder strength, return to regular use of Rt UE   Currently in Pain? Yes   Pain Score 1    Pain Location Shoulder   Pain Orientation Right   Pain Type Surgical pain   Pain Onset More than a month ago   Pain Frequency Intermittent   Aggravating Factors  after doing exercises, ER of Rt shoulder   Pain Relieving Factors rest, ibuprofen                         [facility] Adult PT Treatment/Exercise - 12/31/16 0001      Shoulder Exercises: Supine   External Rotation Right;15 reps;AAROM   Other Supine Exercises chest press with cane 2x10     Shoulder Exercises: Standing    Flexion Strengthening;Right;20 reps  using UE ranger   Other Standing Exercises finger ladder: flexion x 15     Shoulder Exercises: Pulleys   Flexion 3 minutes   ABduction 3 minutes     Shoulder Exercises: Isometric Strengthening   Flexion Limitations 10x5"   Extension Supine     Manual Therapy   Manual Therapy Passive ROM   Passive ROM PROM to tolerance all directions                   PT Short Term Goals - 12/31/16 0853      PT SHORT TERM GOAL #1   Title be independent in initial HEP   Status Achieved     PT SHORT TERM GOAL #2   Title demonstrate Rt shoulder A/ROM flexion to > or = to 110 degrees without scapular elevation   Baseline scapular evelvation-requires tactile and verbal cues   Time 4   Period Weeks   Status On-going     PT SHORT TERM GOAL #3   Title wean from sling 100% and report use of Rt UE for light ADLs and self-care   Status Achieved     PT SHORT TERM GOAL #4  Title report < or = to 2/10 Rt shoulder pain with use   Status Achieved           PT Long Term Goals - 12/19/16 1203      PT LONG TERM GOAL #1   Title be independent in advanced HEP   Time 8   Period Weeks   Status On-going     PT LONG TERM GOAL #2   Title reduce FOTO to < or = to 27% limitation   Period Weeks   Status On-going     PT LONG TERM GOAL #3   Title demonstrate Rt shoulder A/ROM flexion to > or = to 140 degrees without scapular elevation   Time 8   Period Weeks   Status On-going     PT LONG TERM GOAL #4   Title demonstrate 4 to 4+/5 Rt shoulder strength to improve endurance with use   Time 8   Period Weeks   Status On-going     PT LONG TERM GOAL #5   Title demonstrate Rt shoulder P/ROM IR and ER to > or = to 60 degrees to improve mobility   Time 8   Period Weeks   Status On-going     PT LONG TERM GOAL #6   Title report > 75% normal use of Rt UE without pain    Time 8   Period Weeks   Status On-going               Plan - 12/31/16  0908    Clinical Impression Statement Pt reports pain after PT exercises.  He is going to try doing every other day to see if this helps.  Pt tolerated all A/AROM in the clinic without difficulty.  Pt is following prototol. Pt will continue to benefit from skilled PT for safe progression of protocol to safely return to function with Lt UE.     Rehab Potential Good   PT Frequency 2x / week   PT Duration 8 weeks   PT Treatment/Interventions ADLs/Self Care Home Management;Cryotherapy;Electrical Stimulation;Functional mobility training;Moist Heat;Ultrasound;Therapeutic activities;Therapeutic exercise;Neuromuscular re-education;Patient/family education;Passive range of motion;Manual techniques;Taping   PT Next Visit Plan follow post-op protocol;  active assisted ROM;  UE Ranger.  Begin A/ROM and light resisted exercise to tolerance.  Begin arm bike.     Consulted and Agree with Plan of Care Patient      Patient will benefit from skilled therapeutic intervention in order to improve the following deficits and impairments:  Postural dysfunction, Decreased strength, Impaired flexibility, Hypomobility, Pain, Decreased activity tolerance, Decreased endurance, Increased muscle spasms, Decreased range of motion  Visit Diagnosis: Acute pain of right shoulder  Stiffness of right shoulder, not elsewhere classified  Muscle weakness (generalized)     Problem List There are no active problems to display for this patient.    Sigurd Sos, PT 12/31/16 9:27 AM  [facility] [address], [place], [place], [address] Phone: [phone]   Fax:  [phone]  Name: NIKASH MORTENSEN MRN: [id number] Date of Birth: 1950/07/02

## 2017-01-02 ENCOUNTER — Ambulatory Visit: Payer: 59 | Admitting: Physical Therapy

## 2017-01-02 ENCOUNTER — Encounter: Payer: Self-pay | Admitting: Physical Therapy

## 2017-01-02 DIAGNOSIS — M25511 Pain in right shoulder: Secondary | ICD-10-CM | POA: Diagnosis not present

## 2017-01-02 DIAGNOSIS — M6281 Muscle weakness (generalized): Secondary | ICD-10-CM

## 2017-01-02 DIAGNOSIS — M25611 Stiffness of right shoulder, not elsewhere classified: Secondary | ICD-10-CM

## 2017-01-02 NOTE — Therapy (Signed)
[facility] [address], [place], [place], [address] Phone: [phone]   Fax:  [phone]  Physical Therapy Treatment  Patient Details  Name: Kevin Griffith MRN: [id number] Date of Birth: Oct 31, 1950 Referring Provider: Justice Britain, MD  Encounter Date: 01/02/2017      PT End of Session - 01/02/17 0854    Visit Number 6   Number of Visits 10   Date for PT Re-Evaluation 02/11/17   PT Start Time 1062   PT Stop Time 0926   PT Time Calculation (min) 39 min   Activity Tolerance Patient tolerated treatment well   Behavior During Therapy [facility] for tasks assessed/performed      Past Medical History:  Diagnosis Date  . Back pain, chronic   . Cervical stenosis of spine   . DDD (degenerative disc disease), cervical   . ED (erectile dysfunction)   . Hypertriglyceridemia   . Insomnia disorder   . MRSA (methicillin resistant Staphylococcus aureus)   . Sleep apnea, obstructive   . Vitamin D deficiency     Past Surgical History:  Procedure Laterality Date  . APPENDECTOMY  1970  . [place], 07-2001    There were no vitals filed for this visit.      Subjective Assessment - 01/02/17 0849    Subjective My nerve popped into place after I was on the inversion table.  I am miserable today due to gaining weight.    Pertinent History Rt RTC repair, SAD, DCR 11/04/16   Patient Stated Goals improve Rt shoulder strength, return to regular use of Rt UE   Currently in Pain? Yes   Pain Score 2    Pain Location Shoulder   Pain Orientation Right   Pain Type Surgical pain   Pain Onset More than a month ago   Pain Frequency Intermittent   Aggravating Factors  after doing exercises, ER of right shoulder   Pain Relieving Factors rest, Ibuprofen   Multiple Pain Sites No                         [facility] Adult PT Treatment/Exercise - 01/02/17 0001      Shoulder Exercises: Supine   Other Supine Exercises  chest press with cane 2x10   Other Supine Exercises scapula press into the mat hold 5 seconds to improve strength     Shoulder Exercises: Standing   Flexion Strengthening;Right;20 reps  using UE ranger   Other Standing Exercises finger ladder: flexion x 15     Shoulder Exercises: Pulleys   Flexion 3 minutes   Flexion Limitations hold end range   ABduction 3 minutes   ABduction Limitations hold end range     Manual Therapy   Manual Therapy Passive ROM   Passive ROM PROM to tolerance all directions                   PT Short Term Goals - 12/31/16 6948      PT SHORT TERM GOAL #1   Title be independent in initial HEP   Status Achieved     PT SHORT TERM GOAL #2   Title demonstrate Rt shoulder A/ROM flexion to > or = to 110 degrees without scapular elevation   Baseline scapular evelvation-requires tactile and verbal cues   Time 4   Period Weeks   Status On-going     PT SHORT TERM GOAL #3   Title wean from sling  100% and report use of Rt UE for light ADLs and self-care   Status Achieved     PT SHORT TERM GOAL #4   Title report < or = to 2/10 Rt shoulder pain with use   Status Achieved           PT Long Term Goals - 12/19/16 1203      PT LONG TERM GOAL #1   Title be independent in advanced HEP   Time 8   Period Weeks   Status On-going     PT LONG TERM GOAL #2   Title reduce FOTO to < or = to 27% limitation   Period Weeks   Status On-going     PT LONG TERM GOAL #3   Title demonstrate Rt shoulder A/ROM flexion to > or = to 140 degrees without scapular elevation   Time 8   Period Weeks   Status On-going     PT LONG TERM GOAL #4   Title demonstrate 4 to 4+/5 Rt shoulder strength to improve endurance with use   Time 8   Period Weeks   Status On-going     PT LONG TERM GOAL #5   Title demonstrate Rt shoulder P/ROM IR and ER to > or = to 60 degrees to improve mobility   Time 8   Period Weeks   Status On-going     PT LONG TERM GOAL #6   Title  report > 75% normal use of Rt UE without pain    Time 8   Period Weeks   Status On-going               Plan - 01/02/17 8341    Clinical Impression Statement Patient needs tactile cues to relax the upper trap with shoulder flexion.  Patient needed to be reminded to work where there is not increase in pain.  Patient will benefit from skilled therapy for safe progress of protocol to safely return to function with left UE.    Rehab Potential Good   PT Frequency 2x / week   PT Duration 8 weeks   PT Treatment/Interventions ADLs/Self Care Home Management;Cryotherapy;Electrical Stimulation;Functional mobility training;Moist Heat;Ultrasound;Therapeutic activities;Therapeutic exercise;Neuromuscular re-education;Patient/family education;Passive range of motion;Manual techniques;Taping   PT Next Visit Plan follow post-op protocol;  active assisted ROM;  UE Ranger.  Begin A/ROM and light resisted exercise to tolerance.  Begin arm bike.  Send MD progress note.  Seeing MD on 01/07/2017   PT Home Exercise Plan progress as needed   Recommended Other Services MD signed initial summary   Consulted and Agree with Plan of Care Patient      Patient will benefit from skilled therapeutic intervention in order to improve the following deficits and impairments:  Postural dysfunction, Decreased strength, Impaired flexibility, Hypomobility, Pain, Decreased activity tolerance, Decreased endurance, Increased muscle spasms, Decreased range of motion  Visit Diagnosis: Acute pain of right shoulder  Stiffness of right shoulder, not elsewhere classified  Muscle weakness (generalized)     Problem List There are no active problems to display for this patient.   Earlie Counts, PT 01/02/17 9:23 AM   [facility] [address], [place], [place], [address] Phone: [phone]   Fax:  [phone]  Name: Kevin Griffith MRN: [id number] Date of Birth:  February 25, 1951

## 2017-01-06 ENCOUNTER — Ambulatory Visit: Payer: 59

## 2017-01-06 DIAGNOSIS — M25511 Pain in right shoulder: Secondary | ICD-10-CM | POA: Diagnosis not present

## 2017-01-06 DIAGNOSIS — M6281 Muscle weakness (generalized): Secondary | ICD-10-CM | POA: Diagnosis not present

## 2017-01-06 DIAGNOSIS — M25611 Stiffness of right shoulder, not elsewhere classified: Secondary | ICD-10-CM

## 2017-01-06 NOTE — Therapy (Signed)
[facility] [address], [place], [place], [address] Phone: [phone]   Fax:  [phone]  Physical Therapy Treatment  Patient Details  Name: Kevin Griffith MRN: [id number] Date of Birth: October 01, 1950 Referring Provider: Justice Britain, MD  Encounter Date: 01/06/2017      PT End of Session - 01/06/17 0925    Visit Number 7   Number of Visits 10   PT Start Time 0845   PT Stop Time 0927   PT Time Calculation (min) 42 min   Activity Tolerance Patient tolerated treatment well   Behavior During Therapy [facility] for tasks assessed/performed      Past Medical History:  Diagnosis Date  . Back pain, chronic   . Cervical stenosis of spine   . DDD (degenerative disc disease), cervical   . ED (erectile dysfunction)   . Hypertriglyceridemia   . Insomnia disorder   . MRSA (methicillin resistant Staphylococcus aureus)   . Sleep apnea, obstructive   . Vitamin D deficiency     Past Surgical History:  Procedure Laterality Date  . APPENDECTOMY  1970  . [place], 07-2001    There were no vitals filed for this visit.      Subjective Assessment - 01/06/17 0849    Subjective I am stiff at end range.     Patient Stated Goals improve Rt shoulder strength, return to regular use of Rt UE   Currently in Pain? No/denies   Pain Score --  pt reports muscle stiffness            [facility] PT Assessment - 01/06/17 0001      Assessment   Medical Diagnosis s/p Rt shoulder scope, SAD, DCR, RCR     Precautions   Precautions Shoulder   Type of Shoulder Precautions follow post-op protocol     Prior Function   Level of Independence Independent with basic ADLs     Cognition   Overall Cognitive Status Within Functional Limits for tasks assessed     Posture/Postural Control   Posture/Postural Control Postural limitations   Postural Limitations Forward head;Rounded Shoulders     AROM   Right/Left Shoulder Right   Right  Shoulder Flexion 138 Degrees   Right Shoulder ABduction 113 Degrees   Right Shoulder Internal Rotation --  to Rt buttock   Right Shoulder External Rotation --  T2                     [facility] Adult PT Treatment/Exercise - 01/06/17 0001      Shoulder Exercises: Seated   Flexion Strengthening;20 reps;Right   Abduction Strengthening;5 reps;20 reps;Right  scaption     Shoulder Exercises: Standing   Flexion Strengthening;Right;20 reps  using UE ranger   Other Standing Exercises finger ladder: flexion x 15     Shoulder Exercises: Pulleys   Flexion 3 minutes   Flexion Limitations hold end range   ABduction 3 minutes   ABduction Limitations hold end range   Other Pulley Exercises IR x 2 minutes     Shoulder Exercises: ROM/Strengthening   UBE (Upper Arm Bike) Level 1 x 6 minutes (3/3)                  PT Short Term Goals - 01/06/17 0850      PT SHORT TERM GOAL #2   Title demonstrate Rt shoulder A/ROM flexion to > or = to 110 degrees without scapular elevation   Status Achieved  PT SHORT TERM GOAL #3   Title wean from sling 100% and report use of Rt UE for light ADLs and self-care   Status Achieved     PT SHORT TERM GOAL #4   Title report < or = to 2/10 Rt shoulder pain with use   Status Achieved           PT Long Term Goals - 01/06/17 0851      PT LONG TERM GOAL #1   Title be independent in advanced HEP   Time 8   Period Weeks   Status On-going     PT LONG TERM GOAL #2   Title reduce FOTO to < or = to 27% limitation   Time 8   Period Weeks   Status On-going     PT LONG TERM GOAL #3   Title demonstrate Rt shoulder A/ROM flexion to > or = to 140 degrees without scapular elevation   Baseline continued scapular elevation   Time 8   Status On-going     PT LONG TERM GOAL #6   Title report > 75% normal use of Rt UE without pain    Time 8   Period Weeks   Status On-going               Plan - 01/06/17 3845    Clinical  Impression Statement Pt has moved to next phase of protocol.  Pt tolerated addition of gentle strength and arm bike in the clinic today.  Pt requires tactile cues to reduce scapular elevation with flexion and abduction on the Rt.  Pt will continue to benefit from skilled PT for safe progression of post-op protocol.     Rehab Potential Good   PT Frequency 2x / week   PT Duration 8 weeks   PT Treatment/Interventions ADLs/Self Care Home Management;Cryotherapy;Electrical Stimulation;Functional mobility training;Moist Heat;Ultrasound;Therapeutic activities;Therapeutic exercise;Neuromuscular re-education;Patient/family education;Passive range of motion;Manual techniques;Taping   PT Next Visit Plan see what MD says.  Continue to follow post-op protocol for strength and ROM progression.  Begin [place] 4 ways with yellow band   Consulted and Agree with Plan of Care Patient      Patient will benefit from skilled therapeutic intervention in order to improve the following deficits and impairments:  Postural dysfunction, Decreased strength, Impaired flexibility, Hypomobility, Pain, Decreased activity tolerance, Decreased endurance, Increased muscle spasms, Decreased range of motion  Visit Diagnosis: Acute pain of right shoulder  Stiffness of right shoulder, not elsewhere classified  Muscle weakness (generalized)     Problem List There are no active problems to display for this patient.    Sigurd Sos, PT 01/06/17 9:27 AM  [facility] [address], [place], [place], [address] Phone: [phone]   Fax:  [phone]  Name: Kevin Griffith MRN: [id number] Date of Birth: 1950-11-25

## 2017-01-08 ENCOUNTER — Ambulatory Visit: Payer: 59

## 2017-01-08 DIAGNOSIS — M25511 Pain in right shoulder: Secondary | ICD-10-CM | POA: Diagnosis not present

## 2017-01-08 DIAGNOSIS — M6281 Muscle weakness (generalized): Secondary | ICD-10-CM

## 2017-01-08 DIAGNOSIS — M25611 Stiffness of right shoulder, not elsewhere classified: Secondary | ICD-10-CM | POA: Diagnosis not present

## 2017-01-08 NOTE — Therapy (Signed)
[facility] [address], [place], [place], [address] Phone: [phone]   Fax:  [phone]  Physical Therapy Treatment  Patient Details  Name: Kevin Griffith MRN: [id number] Date of Birth: 1950/05/01 Referring Provider: Justice Britain, MD  Encounter Date: 01/08/2017      PT End of Session - 01/08/17 0928    Visit Number 8   Number of Visits 10   Date for PT Re-Evaluation 02/11/17   PT Start Time 8756   PT Stop Time 0929   PT Time Calculation (min) 42 min   Activity Tolerance Patient tolerated treatment well   Behavior During Therapy [facility] for tasks assessed/performed      Past Medical History:  Diagnosis Date  . Back pain, chronic   . Cervical stenosis of spine   . DDD (degenerative disc disease), cervical   . ED (erectile dysfunction)   . Hypertriglyceridemia   . Insomnia disorder   . MRSA (methicillin resistant Staphylococcus aureus)   . Sleep apnea, obstructive   . Vitamin D deficiency     Past Surgical History:  Procedure Laterality Date  . APPENDECTOMY  1970  . [place], 07-2001    There were no vitals filed for this visit.      Subjective Assessment - 01/08/17 0849    Subjective MD is pleased with progress.  I see him again in 4 weeks.   Currently in Pain? No/denies                         [facility] Adult PT Treatment/Exercise - 01/08/17 0001      Shoulder Exercises: Seated   Flexion Strengthening;20 reps;Right  1#   Abduction Strengthening;5 reps;20 reps;Right  scaption 1# added     Shoulder Exercises: Standing   Flexion Strengthening;Right;20 reps  using UE ranger. flexion and horizontal abduction.   Other Standing Exercises finger ladder: flexion x 15     Shoulder Exercises: Pulleys   Flexion 3 minutes   Flexion Limitations hold end range   ABduction 3 minutes   ABduction Limitations hold end range   Other Pulley Exercises IR x 2 minutes     Shoulder  Exercises: ROM/Strengthening   UBE (Upper Arm Bike) Level 1 x 6 minutes (3/3)   Other ROM/Strengthening Exercises [place] 4 ways: 2x10                PT Education - 01/08/17 0903    Education provided Yes   Education Details rockwood 4 ways with yellow band   Person(s) Educated Patient   Methods Explanation;Demonstration;Handout   Comprehension Verbalized understanding;Returned demonstration          PT Short Term Goals - 01/06/17 0850      PT SHORT TERM GOAL #2   Title demonstrate Rt shoulder A/ROM flexion to > or = to 110 degrees without scapular elevation   Status Achieved     PT SHORT TERM GOAL #3   Title wean from sling 100% and report use of Rt UE for light ADLs and self-care   Status Achieved     PT SHORT TERM GOAL #4   Title report < or = to 2/10 Rt shoulder pain with use   Status Achieved           PT Long Term Goals - 01/06/17 0851      PT LONG TERM GOAL #1   Title be independent in advanced HEP   Time  8   Period Weeks   Status On-going     PT LONG TERM GOAL #2   Title reduce FOTO to < or = to 27% limitation   Time 8   Period Weeks   Status On-going     PT LONG TERM GOAL #3   Title demonstrate Rt shoulder A/ROM flexion to > or = to 140 degrees without scapular elevation   Baseline continued scapular elevation   Time 8   Status On-going     PT LONG TERM GOAL #6   Title report > 75% normal use of Rt UE without pain    Time 8   Period Weeks   Status On-going               Plan - 01/08/17 0907    Clinical Impression Statement Pt responded well to the next phase of the protocol.  Pt tolerated addition of gentle strength and arm bike in the clinic.  Pt requires tactile cues to reduce scapular elevation with flexion and abduction on the Rt.  Pt will continue to benefit from skilled PT for safe progression of post-of procotol.     Rehab Potential Good   PT Frequency 2x / week   PT Duration 8 weeks   PT Treatment/Interventions  ADLs/Self Care Home Management;Cryotherapy;Electrical Stimulation;Functional mobility training;Moist Heat;Ultrasound;Therapeutic activities;Therapeutic exercise;Neuromuscular re-education;Patient/family education;Passive range of motion;Manual techniques;Taping   PT Next Visit Plan  Continue to follow post-op protocol for strength and ROM progression.  Review [place] 4 ways with yellow band   Consulted and Agree with Plan of Care Patient      Patient will benefit from skilled therapeutic intervention in order to improve the following deficits and impairments:  Postural dysfunction, Decreased strength, Impaired flexibility, Hypomobility, Pain, Decreased activity tolerance, Decreased endurance, Increased muscle spasms, Decreased range of motion  Visit Diagnosis: Acute pain of right shoulder  Stiffness of right shoulder, not elsewhere classified  Muscle weakness (generalized)     Problem List There are no active problems to display for this patient.    Sigurd Sos, PT 01/08/17 9:31 AM  [facility] [address], [place], [place], [address] Phone: [phone]   Fax:  [phone]  Name: Kevin Griffith MRN: [id number] Date of Birth: 03-03-1951

## 2017-01-08 NOTE — Patient Instructions (Addendum)
Strengthening: Resisted Internal Rotation   Hold tubing in left hand, elbow at side and forearm out. Rotate forearm in across body. Repeat __10__ times per set. Do __2__ sets per session. Do _2___ sessions per day.  http://orth.exer.us/830   Copyright  VHI. All rights reserved.  Strengthening: Resisted External Rotation   Hold tubing in right hand, elbow at side and forearm across body. Rotate forearm out. Repeat _10___ times per set. Do __2__ sets per session. Do __2__ sessions per day.  http://orth.exer.us/828   Copyright  VHI. All rights reserved.  Strengthening: Resisted Flexion   Hold tubing with left arm at side. Pull forward and up. Move shoulder through pain-free range of motion. Repeat _2___ times per set. Do _10___ sets per session. Do _2___ sessions per day.  http://orth.exer.us/824   Copyright  VHI. All rights reserved.  Strengthening: Resisted Extension   Hold tubing in right hand, arm forward. Pull arm back, elbow straight. Repeat __10__ times per set. Do __2__ sets per session. Do __2__ sessions per day.  http://orth.exer.us/832   Copyright  VHI. All rights reserved.  [facility] [address], [place], [place] [address] Phone # [phone] Fax [phone]

## 2017-01-13 ENCOUNTER — Ambulatory Visit: Payer: 59

## 2017-01-13 DIAGNOSIS — M25511 Pain in right shoulder: Secondary | ICD-10-CM | POA: Diagnosis not present

## 2017-01-13 DIAGNOSIS — M25611 Stiffness of right shoulder, not elsewhere classified: Secondary | ICD-10-CM

## 2017-01-13 DIAGNOSIS — M6281 Muscle weakness (generalized): Secondary | ICD-10-CM

## 2017-01-13 NOTE — Therapy (Signed)
[facility] [address] [place], [place], [address] Phone: [phone]   Fax:  [phone]  Physical Therapy Treatment  Patient Details  Name: RAMIREZ FULLBRIGHT MRN: [id number] Date of Birth: [birth year] Referring Provider: Justice Britain, MD  Encounter Date: 01/13/2017      PT End of Session - 01/13/17 0914    Visit Number 9   Number of Visits 10   Date for PT Re-Evaluation 02/11/17   PT Start Time 0835   PT Stop Time 0916   PT Time Calculation (min) 41 min   Activity Tolerance Patient tolerated treatment well   Behavior During Therapy [facility] for tasks assessed/performed      Past Medical History:  Diagnosis Date  . Back pain, chronic   . Cervical stenosis of spine   . DDD (degenerative disc disease), cervical   . ED (erectile dysfunction)   . Hypertriglyceridemia   . Insomnia disorder   . MRSA (methicillin resistant Staphylococcus aureus)   . Sleep apnea, obstructive   . Vitamin D deficiency     Past Surgical History:  Procedure Laterality Date  . APPENDECTOMY  1970  . [place], 07-2001    There were no vitals filed for this visit.      Subjective Assessment - 01/13/17 0833    Subjective Doing well with exercises.   Patient Stated Goals improve Rt shoulder strength, return to regular use of Rt UE            [facility] PT Assessment - 01/13/17 0001      Observation/Other Assessments   Focus on Therapeutic Outcomes (FOTO)  46% limitation                     [facility] Adult PT Treatment/Exercise - 01/13/17 0001      Shoulder Exercises: Seated   Flexion Strengthening;20 reps;Right  1#   Abduction Strengthening;5 reps;20 reps;Right  scaption 1# added     Shoulder Exercises: Standing   Flexion Strengthening;Right;20 reps  using UE ranger. flexion and horizontal abduction.   Other Standing Exercises finger ladder: flexion x 15     Shoulder Exercises: Pulleys   Flexion 3  minutes   Flexion Limitations hold end range   ABduction 3 minutes   ABduction Limitations hold end range   Other Pulley Exercises IR x 2 minutes     Shoulder Exercises: ROM/Strengthening   UBE (Upper Arm Bike) Level 1 x 8 minutes (4/4)   Other ROM/Strengthening Exercises [place] 4 ways: 2x10                  PT Short Term Goals - 01/06/17 0850      PT SHORT TERM GOAL #2   Title demonstrate Rt shoulder A/ROM flexion to > or = to 110 degrees without scapular elevation   Status Achieved     PT SHORT TERM GOAL #3   Title wean from sling 100% and report use of Rt UE for light ADLs and self-care   Status Achieved     PT SHORT TERM GOAL #4   Title report < or = to 2/10 Rt shoulder pain with use   Status Achieved           PT Long Term Goals - 01/13/17 0859      PT LONG TERM GOAL #1   Title be independent in advanced HEP   Time 8   Period Weeks   Status On-going  PT LONG TERM GOAL #2   Title reduce FOTO to < or = to 27% limitation   Baseline 46% limitation   Time 8   Period Weeks   Status On-going     PT LONG TERM GOAL #3   Title demonstrate Rt shoulder A/ROM flexion to > or = to 140 degrees without scapular elevation   Baseline continued scapular elevation   Time 8   Period Weeks   Status On-going     PT LONG TERM GOAL #5   Title demonstrate Rt shoulder P/ROM IR and ER to > or = to 60 degrees to improve mobility   Time 8   Period Weeks   Status On-going     PT LONG TERM GOAL #6   Title report > 75% normal use of Rt UE without pain    Status Achieved               Plan - 01/13/17 0900    Clinical Impression Statement Pt reports 80-85% use of Rt UE with ADLs and self-care.  Pt is limited with heavy use and anything that requires resistance.  Pt has tolerated increased time on arm bike and resistance exercises.  Pt requires tactile and demo cues to reduce scapular elevation with flexion and abduction.  Pt will continue to benefit from  skilled PT for strength, flexibility and safe progression of protocol.      Rehab Potential Good   PT Frequency 2x / week   PT Duration 8 weeks   PT Treatment/Interventions ADLs/Self Care Home Management;Cryotherapy;Electrical Stimulation;Functional mobility training;Moist Heat;Ultrasound;Therapeutic activities;Therapeutic exercise;Neuromuscular re-education;Patient/family education;Passive range of motion;Manual techniques;Taping   PT Next Visit Plan  Continue to follow post-op protocol for strength and ROM progression.  Measure ER and report G-codes (FOTO done today)   Consulted and Agree with Plan of Care Patient      Patient will benefit from skilled therapeutic intervention in order to improve the following deficits and impairments:  Postural dysfunction, Decreased strength, Impaired flexibility, Hypomobility, Pain, Decreased activity tolerance, Decreased endurance, Increased muscle spasms, Decreased range of motion  Visit Diagnosis: Acute pain of right shoulder  Stiffness of right shoulder, not elsewhere classified  Muscle weakness (generalized)     Problem List There are no active problems to display for this patient.    Sigurd Sos, PT 01/13/17 9:15 AM  [facility] [address] [place], [place], [address] Phone: [phone]   Fax:  [phone]  Name: ZAN ORLICK MRN: [id number] Date of Birth: 31-Mar-1951

## 2017-01-15 ENCOUNTER — Encounter: Payer: 59 | Admitting: Physical Therapy

## 2017-01-16 ENCOUNTER — Ambulatory Visit: Payer: 59 | Admitting: Physical Therapy

## 2017-01-16 DIAGNOSIS — M25611 Stiffness of right shoulder, not elsewhere classified: Secondary | ICD-10-CM

## 2017-01-16 DIAGNOSIS — M6281 Muscle weakness (generalized): Secondary | ICD-10-CM | POA: Diagnosis not present

## 2017-01-16 DIAGNOSIS — M25511 Pain in right shoulder: Secondary | ICD-10-CM | POA: Diagnosis not present

## 2017-01-16 NOTE — Therapy (Signed)
[facility] [address], [place], [place], [address] Phone: [phone]   Fax:  [phone]  Physical Therapy Treatment/G-codes  Patient Details  Name: Kevin Griffith MRN: [id number] Date of Birth: 15-Sep-1950 Referring Provider: Justice Britain, MD  Encounter Date: 01/16/2017      PT End of Session - 01/16/17 0839    Visit Number 10   Number of Visits 19   Date for PT Re-Evaluation 02/11/17   PT Start Time 0842   PT Stop Time 0925   PT Time Calculation (min) 43 min   Activity Tolerance Patient tolerated treatment well   Behavior During Therapy [facility] for tasks assessed/performed      Past Medical History:  Diagnosis Date  . Back pain, chronic   . Cervical stenosis of spine   . DDD (degenerative disc disease), cervical   . ED (erectile dysfunction)   . Hypertriglyceridemia   . Insomnia disorder   . MRSA (methicillin resistant Staphylococcus aureus)   . Sleep apnea, obstructive   . Vitamin D deficiency     Past Surgical History:  Procedure Laterality Date  . APPENDECTOMY  1970  . [place], 07-2001    There were no vitals filed for this visit.      Subjective Assessment - 01/16/17 0843    Subjective Pt reports that things are going well. He continues to use his infrared therapy at home with good results.    Patient Stated Goals improve Rt shoulder strength, return to regular use of Rt UE   Currently in Pain? No/denies            [facility] PT Assessment - 01/16/17 0001      PROM   Right Shoulder External Rotation 65 Degrees  at 90 deg abd                      [facility] Adult PT Treatment/Exercise - 01/16/17 0001      Shoulder Exercises: Sidelying   External Rotation Right;10 reps;Strengthening   External Rotation Weight (lbs) 2   External Rotation Limitations towel underneath arm    ABduction 15 reps;Right   ABduction Limitations Red TB, elbow flexed during return to start       Shoulder Exercises: Pulleys   Flexion 2 minutes   ABduction 2 minutes     Shoulder Exercises: ROM/Strengthening   UBE (Upper Arm Bike) Level 1 x 8 minutes (4/4)   Wall Pushups 10 reps   Pushups Limitations x2 sets on raised table    Plank Limitations plank walks across raised mat table x5 RT, red TB around wrists    Other ROM/Strengthening Exercises Incline plank hold with single UE reach various directions x20 reps each UE     Shoulder Exercises: Stretch   Internal Rotation Stretch 10 seconds   Internal Rotation Stretch Limitations with towel, x10 reps     External Rotation Stretch 10 seconds  shoulder at 90 deg abd, x10 reps                  PT Education - 01/16/17 0914    Education provided Yes   Education Details technique with therex and implications for specific exercises completed throughout the session    Person(s) Educated Patient   Methods Explanation;Demonstration;Verbal cues;Tactile cues   Comprehension Verbalized understanding;Returned demonstration          PT Short Term Goals - 01/06/17 0850      PT SHORT TERM  GOAL #2   Title demonstrate Rt shoulder A/ROM flexion to > or = to 110 degrees without scapular elevation   Status Achieved     PT SHORT TERM GOAL #3   Title wean from sling 100% and report use of Rt UE for light ADLs and self-care   Status Achieved     PT SHORT TERM GOAL #4   Title report < or = to 2/10 Rt shoulder pain with use   Status Achieved           PT Long Term Goals - 01/13/17 0859      PT LONG TERM GOAL #1   Title be independent in advanced HEP   Time 8   Period Weeks   Status On-going     PT LONG TERM GOAL #2   Title reduce FOTO to < or = to 27% limitation   Baseline 46% limitation   Time 8   Period Weeks   Status On-going     PT LONG TERM GOAL #3   Title demonstrate Rt shoulder A/ROM flexion to > or = to 140 degrees without scapular elevation   Baseline continued scapular elevation   Time 8   Period Weeks    Status On-going     PT LONG TERM GOAL #5   Title demonstrate Rt shoulder P/ROM IR and ER to > or = to 60 degrees to improve mobility   Time 8   Period Weeks   Status On-going     PT LONG TERM GOAL #6   Title report > 75% normal use of Rt UE without pain    Status Achieved               Plan - 2017-01-20 0927    Clinical Impression Statement Pt continues to make progress towards his goals with increasing shoulder endurance, ROM, and strength. Pt was recently able to sleep on his Rt shoulder through the night without pain and continues to complete his HEP regularly. Session focused on therex to progress shoulder ROM, strength and endurance, noting fatigue and muscle shaking by the end of the session. Will continue with current POC.    Rehab Potential Good   PT Frequency 2x / week   PT Duration 8 weeks   PT Treatment/Interventions ADLs/Self Care Home Management;Cryotherapy;Electrical Stimulation;Functional mobility training;Moist Heat;Ultrasound;Therapeutic activities;Therapeutic exercise;Neuromuscular re-education;Patient/family education;Passive range of motion;Manual techniques;Taping   PT Next Visit Plan  Continue to follow post-op protocol for strength and ROM progressions. Shoulder endurance; Review [place] 4 ways with yellow band   Consulted and Agree with Plan of Care Patient      Patient will benefit from skilled therapeutic intervention in order to improve the following deficits and impairments:  Postural dysfunction, Decreased strength, Impaired flexibility, Hypomobility, Pain, Decreased activity tolerance, Decreased endurance, Increased muscle spasms, Decreased range of motion  Visit Diagnosis: Acute pain of right shoulder  Stiffness of right shoulder, not elsewhere classified  Muscle weakness (generalized)       G-Codes - 20-Jan-2017 0929    Functional Assessment Tool Used (Outpatient Only) Clinical judgement based on ROM, pt report and activity tolerance during  today's session    Functional Limitation Other PT primary   Other PT Primary Current Status (G9924) At least 20 percent but less than 40 percent impaired, limited or restricted   Other PT Primary Goal Status (Q6834) At least 20 percent but less than 40 percent impaired, limited or restricted      Problem List There are no active  problems to display for this patient.  9:33 AM,01/16/17 [place] [place] [facility] [place]  [place] [address], [place], [place], [address] Phone: [phone]   Fax:  [phone]  Name: Kevin Griffith MRN: [id number] Date of Birth: 05-03-1950

## 2017-01-20 ENCOUNTER — Ambulatory Visit: Payer: 59

## 2017-01-20 DIAGNOSIS — M6281 Muscle weakness (generalized): Secondary | ICD-10-CM

## 2017-01-20 DIAGNOSIS — M25511 Pain in right shoulder: Secondary | ICD-10-CM

## 2017-01-20 DIAGNOSIS — M25611 Stiffness of right shoulder, not elsewhere classified: Secondary | ICD-10-CM | POA: Diagnosis not present

## 2017-01-20 NOTE — Therapy (Signed)
[facility] [address] [place], [place], [address] Phone: [phone]   Fax:  [phone]  Physical Therapy Treatment  Patient Details  Name: MONTANA FASSNACHT MRN: [id number] Date of Birth: [birth year] Referring Provider: Justice Britain, MD  Encounter Date: 01/20/2017      PT End of Session - 01/20/17 0934    Visit Number 11   Number of Visits 19   Date for PT Re-Evaluation 02/11/17   PT Start Time 0845   PT Stop Time 0928   PT Time Calculation (min) 43 min   Activity Tolerance Patient tolerated treatment well   Behavior During Therapy [facility] for tasks assessed/performed      Past Medical History:  Diagnosis Date  . Back pain, chronic   . Cervical stenosis of spine   . DDD (degenerative disc disease), cervical   . ED (erectile dysfunction)   . Hypertriglyceridemia   . Insomnia disorder   . MRSA (methicillin resistant Staphylococcus aureus)   . Sleep apnea, obstructive   . Vitamin D deficiency     Past Surgical History:  Procedure Laterality Date  . APPENDECTOMY  1970  . [place], 07-2001    There were no vitals filed for this visit.      Subjective Assessment - 01/20/17 0853    Subjective Pt reports that last session was too aggressive and he has been sore ever since.    Currently in Pain? Yes   Pain Score 2    Pain Location Shoulder   Pain Orientation Right   Pain Descriptors / Indicators Sore;Tightness   Pain Type Surgical pain   Pain Onset More than a month ago   Aggravating Factors  after doing exercise, ER of Rt shoulder    Pain Relieving Factors stretching, ibuprofen, ice            [facility] PT Assessment - 01/20/17 0001      AROM   Right Shoulder Flexion 135 Degrees                     [facility] Adult PT Treatment/Exercise - 01/20/17 0001      Shoulder Exercises: Seated   Flexion --   Abduction --     Shoulder Exercises: Standing   Flexion  Strengthening;Right;20 reps  using UE ranger. flexion and horizontal abduction.   Other Standing Exercises finger ladder: flexion x 15   Other Standing Exercises cone stack to 2nd shelf 1# added x 2 minutes, to 3rd shelf x 2 minutes     Shoulder Exercises: Pulleys   Flexion 2 minutes   ABduction 2 minutes   Other Pulley Exercises IR x 2 minutes     Shoulder Exercises: ROM/Strengthening   UBE (Upper Arm Bike) Level 1 x 8 minutes (4/4)   Wall Pushups 20 reps  tactile cues for scapular stabilization                  PT Short Term Goals - 01/06/17 0850      PT SHORT TERM GOAL #2   Title demonstrate Rt shoulder A/ROM flexion to > or = to 110 degrees without scapular elevation   Status Achieved     PT SHORT TERM GOAL #3   Title wean from sling 100% and report use of Rt UE for light ADLs and self-care   Status Achieved     PT SHORT TERM GOAL #4   Title report < or = to 2/10 Rt  shoulder pain with use   Status Achieved           PT Long Term Goals - 01/20/17 0856      PT LONG TERM GOAL #1   Title be independent in advanced HEP   Time 8   Period Weeks   Status On-going     PT LONG TERM GOAL #2   Title reduce FOTO to < or = to 27% limitation   Baseline 46% limitation   Time 8   Period Weeks   Status On-going     PT LONG TERM GOAL #3   Title demonstrate Rt shoulder A/ROM flexion to > or = to 140 degrees without scapular elevation     PT LONG TERM GOAL #5   Title demonstrate Rt shoulder P/ROM IR and ER to > or = to 60 degrees to improve mobility   Baseline 65 degrees   Status Achieved     PT LONG TERM GOAL #6   Title report > 75% normal use of Rt UE without pain    Status Achieved               Plan - 01/20/17 0858    Clinical Impression Statement Pt had increased pain after last session and has had to limit his exercise to allow for recovery.  Pt with improved Rt shoulder A/ROM.  PT focused on low level strength exercises to avoid flare-up.  Pt  will continue to benefit from skilled PT for UE strength and flexibility per protocol.     Rehab Potential Good   PT Frequency 2x / week   PT Duration 8 weeks   PT Treatment/Interventions ADLs/Self Care Home Management;Cryotherapy;Electrical Stimulation;Functional mobility training;Moist Heat;Ultrasound;Therapeutic activities;Therapeutic exercise;Neuromuscular re-education;Patient/family education;Passive range of motion;Manual techniques;Taping   PT Next Visit Plan  Continue to follow post-op protocol for strength and ROM progression. Shoulder endurance   Consulted and Agree with Plan of Care Patient      Patient will benefit from skilled therapeutic intervention in order to improve the following deficits and impairments:  Postural dysfunction, Decreased strength, Impaired flexibility, Hypomobility, Pain, Decreased activity tolerance, Decreased endurance, Increased muscle spasms, Decreased range of motion  Visit Diagnosis: Acute pain of right shoulder  Stiffness of right shoulder, not elsewhere classified  Muscle weakness (generalized)     Problem List There are no active problems to display for this patient.    Sigurd Sos, PT 01/20/17 9:38 AM  [facility] [address] [place], [place], [address] Phone: [phone]   Fax:  [phone]  Name: JATNIEL VERASTEGUI MRN: [id number] Date of Birth: 05-06-1950

## 2017-01-22 ENCOUNTER — Ambulatory Visit: Payer: 59

## 2017-01-22 DIAGNOSIS — M25511 Pain in right shoulder: Secondary | ICD-10-CM | POA: Diagnosis not present

## 2017-01-22 DIAGNOSIS — M6281 Muscle weakness (generalized): Secondary | ICD-10-CM | POA: Diagnosis not present

## 2017-01-22 DIAGNOSIS — M25611 Stiffness of right shoulder, not elsewhere classified: Secondary | ICD-10-CM

## 2017-01-22 NOTE — Therapy (Signed)
[facility] [address] [place], [place], [address] Phone: [phone]   Fax:  [phone]  Physical Therapy Treatment  Patient Details  Name: JATAVION PEASTER MRN: [id number] Date of Birth: 05-18-1950 Referring Provider: Justice Britain, MD  Encounter Date: 01/22/2017      PT End of Session - 01/22/17 0925    Visit Number 12   Number of Visits 20   Date for PT Re-Evaluation 02/11/17   PT Start Time 0845   PT Stop Time 0928   PT Time Calculation (min) 43 min   Activity Tolerance Patient tolerated treatment well   Behavior During Therapy [facility] for tasks assessed/performed      Past Medical History:  Diagnosis Date  . Back pain, chronic   . Cervical stenosis of spine   . DDD (degenerative disc disease), cervical   . ED (erectile dysfunction)   . Hypertriglyceridemia   . Insomnia disorder   . MRSA (methicillin resistant Staphylococcus aureus)   . Sleep apnea, obstructive   . Vitamin D deficiency     Past Surgical History:  Procedure Laterality Date  . APPENDECTOMY  1970  . [place], 07-2001    There were no vitals filed for this visit.      Subjective Assessment - 01/22/17 0842    Subjective I am feeling really good.  No pain after last session.     Currently in Pain? No/denies                         [facility] Adult PT Treatment/Exercise - 01/22/17 0001      Shoulder Exercises: Standing   Flexion Strengthening;Right;20 reps  using UE ranger. flexion and horizontal abduction.   Other Standing Exercises finger ladder: flexion x 15  1.5#   Other Standing Exercises cone stack to 2nd shelf 1.5# added x 2 minutes, to 3rd shelf x 2 minutes     Shoulder Exercises: Pulleys   Flexion 2 minutes   ABduction 2 minutes   Other Pulley Exercises IR x 2 minutes     Shoulder Exercises: ROM/Strengthening   UBE (Upper Arm Bike) Level 2 x 8 minutes (4/4)   Wall Pushups 20 reps  tactile cues  for scapular stabilization     Manual Therapy   Manual Therapy Passive ROM   Passive ROM PROM to tolerance all directions                   PT Short Term Goals - 01/06/17 0850      PT SHORT TERM GOAL #2   Title demonstrate Rt shoulder A/ROM flexion to > or = to 110 degrees without scapular elevation   Status Achieved     PT SHORT TERM GOAL #3   Title wean from sling 100% and report use of Rt UE for light ADLs and self-care   Status Achieved     PT SHORT TERM GOAL #4   Title report < or = to 2/10 Rt shoulder pain with use   Status Achieved           PT Long Term Goals - 01/20/17 0856      PT LONG TERM GOAL #1   Title be independent in advanced HEP   Time 8   Period Weeks   Status On-going     PT LONG TERM GOAL #2   Title reduce FOTO to < or = to 27% limitation   Baseline  46% limitation   Time 8   Period Weeks   Status On-going     PT LONG TERM GOAL #3   Title demonstrate Rt shoulder A/ROM flexion to > or = to 140 degrees without scapular elevation     PT LONG TERM GOAL #5   Title demonstrate Rt shoulder P/ROM IR and ER to > or = to 60 degrees to improve mobility   Baseline 65 degrees   Status Achieved     PT LONG TERM GOAL #6   Title report > 75% normal use of Rt UE without pain    Status Achieved               Plan - 01/22/17 0928    Clinical Impression Statement Pt with improved shoulder pain this visit.  Pt with stiffness at end range shoulder flexion.  Pt with improved Rt shoulder A/ROM.  Pt able to perform exercises with increased resistance today.  Pt will continue to benefit from skilled PT for endurance, strength and flexibility per protocol.     Rehab Potential Good   PT Frequency 2x / week   PT Duration 8 weeks   PT Treatment/Interventions ADLs/Self Care Home Management;Cryotherapy;Electrical Stimulation;Functional mobility training;Moist Heat;Ultrasound;Therapeutic activities;Therapeutic exercise;Neuromuscular  re-education;Patient/family education;Passive range of motion;Manual techniques;Taping   PT Next Visit Plan  Continue to follow post-op protocol for strength and ROM progression. Shoulder endurance   Consulted and Agree with Plan of Care Patient      Patient will benefit from skilled therapeutic intervention in order to improve the following deficits and impairments:  Postural dysfunction, Decreased strength, Impaired flexibility, Hypomobility, Pain, Decreased activity tolerance, Decreased endurance, Increased muscle spasms, Decreased range of motion  Visit Diagnosis: Acute pain of right shoulder  Stiffness of right shoulder, not elsewhere classified  Muscle weakness (generalized)     Problem List There are no active problems to display for this patient.   Sigurd Sos, PT 01/22/17 9:35 AM  [facility] [address] [place], [place], [address] Phone: [phone]   Fax:  [phone]  Name: GIOVANNY DUGAL MRN: [id number] Date of Birth: 20-Aug-1950

## 2017-01-27 ENCOUNTER — Ambulatory Visit: Payer: 59 | Attending: Orthopedic Surgery | Admitting: Physical Therapy

## 2017-01-27 DIAGNOSIS — M25611 Stiffness of right shoulder, not elsewhere classified: Secondary | ICD-10-CM | POA: Insufficient documentation

## 2017-01-27 DIAGNOSIS — M25511 Pain in right shoulder: Secondary | ICD-10-CM | POA: Insufficient documentation

## 2017-01-27 DIAGNOSIS — M6281 Muscle weakness (generalized): Secondary | ICD-10-CM | POA: Insufficient documentation

## 2017-01-27 NOTE — Therapy (Signed)
[facility] [address], [place], [place], [address] Phone: [phone]   Fax:  [phone]  Physical Therapy Treatment  Patient Details  Name: Kevin Griffith MRN: [id number] Date of Birth: [birth year] Referring Provider: Justice Britain, MD  Encounter Date: 01/27/2017      PT End of Session - 01/27/17 1143    Visit Number 13   Number of Visits 20   Date for PT Re-Evaluation 02/11/17   PT Start Time 3532   PT Stop Time 9924   PT Time Calculation (min) 41 min   Activity Tolerance Patient tolerated treatment well;No increased pain   Behavior During Therapy WFL for tasks assessed/performed      Past Medical History:  Diagnosis Date  . Back pain, chronic   . Cervical stenosis of spine   . DDD (degenerative disc disease), cervical   . ED (erectile dysfunction)   . Hypertriglyceridemia   . Insomnia disorder   . MRSA (methicillin resistant Staphylococcus aureus)   . Sleep apnea, obstructive   . Vitamin D deficiency     Past Surgical History:  Procedure Laterality Date  . APPENDECTOMY  1970  . [place], 07-2001    There were no vitals filed for this visit.      Subjective Assessment - 01/27/17 1109    Subjective Pt reports that he was busy doing alot of yard work and his shoulder was pretty tired. He has no pain currently. He did his flexion stretch only once after his last session.    Currently in Pain? No/denies                         [facility] Adult PT Treatment/Exercise - 01/27/17 0001      Shoulder Exercises: Supine   Protraction 20 reps;Weights;Both   Protraction Weight (lbs) 5   Flexion 20 reps;AAROM   Other Supine Exercises shoulder protraction into horizontal abduction x15 reps, using red TB    Other Supine Exercises Supine RUE D1/D2 flexion with yellow TB resistance x20 reps     Shoulder Exercises: Pulleys   Flexion 3 minutes   Flexion Limitations Therapist providing  manual assistance for scapular rotation     Shoulder Exercises: ROM/Strengthening   Rhythmic Stabilization, Supine at elbow, 3x60"                 PT Education - 01/27/17 1252    Education provided Yes   Education Details technique with therex; expectations of soreness up to 48 hours following exercise   Person(s) Educated Patient   Methods Explanation;Tactile cues;Demonstration   Comprehension Verbalized understanding;Returned demonstration          PT Short Term Goals - 01/06/17 0850      PT SHORT TERM GOAL #2   Title demonstrate Rt shoulder A/ROM flexion to > or = to 110 degrees without scapular elevation   Status Achieved     PT SHORT TERM GOAL #3   Title wean from sling 100% and report use of Rt UE for light ADLs and self-care   Status Achieved     PT SHORT TERM GOAL #4   Title report < or = to 2/10 Rt shoulder pain with use   Status Achieved           PT Long Term Goals - 01/20/17 0856      PT LONG TERM GOAL #1   Title be independent in advanced HEP  Time 8   Period Weeks   Status On-going     PT LONG TERM GOAL #2   Title reduce FOTO to < or = to 27% limitation   Baseline 46% limitation   Time 8   Period Weeks   Status On-going     PT LONG TERM GOAL #3   Title demonstrate Rt shoulder A/ROM flexion to > or = to 140 degrees without scapular elevation     PT LONG TERM GOAL #5   Title demonstrate Rt shoulder P/ROM IR and ER to > or = to 60 degrees to improve mobility   Baseline 65 degrees   Status Achieved     PT LONG TERM GOAL #6   Title report > 75% normal use of Rt UE without pain    Status Achieved               Plan - 01/27/17 1211    Clinical Impression Statement Pt with some Rt shoulder fatigue following completing alot of yard work yesterday. Session focused on therex to promote Rt shoulder ROM into flexion with the addition of a few new exercises to increase neuromuscular control and endurance of the shoulder musculature.  Pt did require moderate cuing to focus on technique and decrease tendency for Rt upper trap over activation during shoulder elevation.     Rehab Potential Good   PT Frequency 2x / week   PT Duration 8 weeks   PT Treatment/Interventions ADLs/Self Care Home Management;Cryotherapy;Electrical Stimulation;Functional mobility training;Moist Heat;Ultrasound;Therapeutic activities;Therapeutic exercise;Neuromuscular re-education;Patient/family education;Passive range of motion;Manual techniques;Taping   PT Next Visit Plan  Continue to follow post-op protocol for strength and ROM progression. Shoulder endurance and periscapular strengthening   Consulted and Agree with Plan of Care Patient      Patient will benefit from skilled therapeutic intervention in order to improve the following deficits and impairments:  Postural dysfunction, Decreased strength, Impaired flexibility, Hypomobility, Pain, Decreased activity tolerance, Decreased endurance, Increased muscle spasms, Decreased range of motion  Visit Diagnosis: Acute pain of right shoulder  Stiffness of right shoulder, not elsewhere classified  Muscle weakness (generalized)     Problem List There are no active problems to display for this patient.   1:18 PM,01/27/17 Elly Modena PT, [place] [facility] [place]  [place] [address], [place], [place], [address] Phone: [phone]   Fax:  [phone]  Name: Kevin Griffith MRN: [id number] Date of Birth: Aug 28, 1950

## 2017-01-29 ENCOUNTER — Ambulatory Visit: Payer: 59 | Admitting: Physical Therapy

## 2017-01-29 DIAGNOSIS — M6281 Muscle weakness (generalized): Secondary | ICD-10-CM

## 2017-01-29 DIAGNOSIS — M25611 Stiffness of right shoulder, not elsewhere classified: Secondary | ICD-10-CM

## 2017-01-29 DIAGNOSIS — M25511 Pain in right shoulder: Secondary | ICD-10-CM

## 2017-01-29 NOTE — Therapy (Signed)
[facility] [address], [place], [place], [address] Phone: [phone]   Fax:  [phone]  Physical Therapy Treatment  Patient Details  Name: Kevin Griffith MRN: [id number] Date of Birth: 03-17-1951 Referring Provider: Justice Britain, MD  Encounter Date: 01/29/2017      PT End of Session - 01/29/17 1027    Visit Number 14   Number of Visits 20   Date for PT Re-Evaluation 02/11/17   PT Start Time 0846   PT Stop Time 0928   PT Time Calculation (min) 42 min   Activity Tolerance Patient tolerated treatment well;No increased pain   Behavior During Therapy WFL for tasks assessed/performed      Past Medical History:  Diagnosis Date  . Back pain, chronic   . Cervical stenosis of spine   . DDD (degenerative disc disease), cervical   . ED (erectile dysfunction)   . Hypertriglyceridemia   . Insomnia disorder   . MRSA (methicillin resistant Staphylococcus aureus)   . Sleep apnea, obstructive   . Vitamin D deficiency     Past Surgical History:  Procedure Laterality Date  . APPENDECTOMY  1970  . [place], 07-2001    There were no vitals filed for this visit.      Subjective Assessment - 01/29/17 0848    Subjective Pt reports that things continue to go well. He went out in his yard and did some raking for several hours. He had some extra cramping yesterday, but no issues currently.    Currently in Pain? No/denies            [facility] PT Assessment - 01/29/17 0001      Observation/Other Assessments   Other Surveys  Other Surveys   Upper Extremity Functional Index  78/80                     [facility] Adult PT Treatment/Exercise - 01/29/17 0001      Shoulder Exercises: Seated   Flexion 20 reps;Right;Strengthening   Flexion Weight (lbs) 3   Other Seated Exercises scaption (RUE) x7 reps, discontinued due to Rt shoulder muscle spasm     Shoulder Exercises: Prone   Other Prone Exercises  RUE Y/T/I x10 reps, retraction with ER/IR x10 reps      Shoulder Exercises: Pulleys   Flexion 3 minutes     Shoulder Exercises: ROM/Strengthening   UBE (Upper Arm Bike) L4, x3 min forward/x3 min backwards    Plank 3 reps;30 seconds   Plank Limitations on elbows, serratus presses x10 reps    Other ROM/Strengthening Exercises horizontal adduction, green TB 2x10-15 reps (various angles to work through the entire range of motion)      Manual Therapy   Manual Therapy Soft tissue mobilization   Soft tissue mobilization STM Rt anterior deltoid x7 min                 PT Education - 01/29/17 1026    Education provided Yes   Education Details technique with therex; discussed improvements in shoulder function and remaining limitations   Person(s) Educated Patient   Methods Explanation;Verbal cues;Tactile cues   Comprehension Returned demonstration;Verbalized understanding          PT Short Term Goals - 01/06/17 0850      PT SHORT TERM GOAL #2   Title demonstrate Rt shoulder A/ROM flexion to > or = to 110 degrees without scapular elevation   Status Achieved  PT SHORT TERM GOAL #3   Title wean from sling 100% and report use of Rt UE for light ADLs and self-care   Status Achieved     PT SHORT TERM GOAL #4   Title report < or = to 2/10 Rt shoulder pain with use   Status Achieved           PT Long Term Goals - 01/20/17 0856      PT LONG TERM GOAL #1   Title be independent in advanced HEP   Time 8   Period Weeks   Status On-going     PT LONG TERM GOAL #2   Title reduce FOTO to < or = to 27% limitation   Baseline 46% limitation   Time 8   Period Weeks   Status On-going     PT LONG TERM GOAL #3   Title demonstrate Rt shoulder A/ROM flexion to > or = to 140 degrees without scapular elevation     PT LONG TERM GOAL #5   Title demonstrate Rt shoulder P/ROM IR and ER to > or = to 60 degrees to improve mobility   Baseline 65 degrees   Status Achieved     PT  LONG TERM GOAL #6   Title report > 75% normal use of Rt UE without pain    Status Achieved               Plan - 01/29/17 1027    Clinical Impression Statement Pt is making good progress towards his goals, scoring 78 out of 80 on the upper extremity functional index measurement. This indicates that his RUE is having a minimal impact on completion of his daily activity. Session continued with focus on exercise progressions to improve shoulder strength, endurance and neuromuscular control. He was able to complete all of today's exercises with occasional muscle shaking from fatigue. He did have increased difficulty completing prone scapular strengthening due to end range shoulder elevation limitations. Ended with STM along the Rt anterior deltoid with report of decreased soreness/stiffness following. Pt would continue to benefit from skilled PT intervention to further promote shoulder strength/flexibility and endurance.    Rehab Potential Good   PT Frequency 2x / week   PT Duration 8 weeks   PT Treatment/Interventions ADLs/Self Care Home Management;Cryotherapy;Electrical Stimulation;Functional mobility training;Moist Heat;Ultrasound;Therapeutic activities;Therapeutic exercise;Neuromuscular re-education;Patient/family education;Passive range of motion;Manual techniques;Taping   PT Next Visit Plan  Continue to follow post-op protocol for strength and ROM progression. Shoulder endurance and periscapular strengthening   Consulted and Agree with Plan of Care Patient      Patient will benefit from skilled therapeutic intervention in order to improve the following deficits and impairments:  Postural dysfunction, Decreased strength, Impaired flexibility, Hypomobility, Pain, Decreased activity tolerance, Decreased endurance, Increased muscle spasms, Decreased range of motion  Visit Diagnosis: Acute pain of right shoulder  Muscle weakness (generalized)  Stiffness of right shoulder, not elsewhere  classified     Problem List There are no active problems to display for this patient.   10:39 AM,01/29/17 [place], [place] at [place]  [place] [address] [place], [place], [address] Phone: [phone]   Fax:  [phone]  Name: MANJINDER BREAU MRN: [id number] Date of Birth: 1950/06/20

## 2017-02-03 ENCOUNTER — Ambulatory Visit: Payer: 59

## 2017-02-03 DIAGNOSIS — M6281 Muscle weakness (generalized): Secondary | ICD-10-CM

## 2017-02-03 DIAGNOSIS — M25511 Pain in right shoulder: Secondary | ICD-10-CM | POA: Diagnosis not present

## 2017-02-03 DIAGNOSIS — M25611 Stiffness of right shoulder, not elsewhere classified: Secondary | ICD-10-CM | POA: Diagnosis not present

## 2017-02-03 NOTE — Therapy (Signed)
[facility] [address] [place], [place], [address] Phone: [phone]   Fax:  [phone]  Physical Therapy Treatment  Patient Details  Name: Kevin Griffith MRN: [id number] Date of Birth: 24-Dec-1950 Referring Provider: Justice Britain, MD  Encounter Date: 02/03/2017      PT End of Session - 02/03/17 0935    Visit Number 15   Number of Visits 20   Date for PT Re-Evaluation 03/20/17   PT Start Time 0846   PT Stop Time 0932   PT Time Calculation (min) 46 min   Equipment Utilized During Treatment --   Activity Tolerance Patient tolerated treatment well   Behavior During Therapy [facility] for tasks assessed/performed      Past Medical History:  Diagnosis Date  . Back pain, chronic   . Cervical stenosis of spine   . DDD (degenerative disc disease), cervical   . ED (erectile dysfunction)   . Hypertriglyceridemia   . Insomnia disorder   . MRSA (methicillin resistant Staphylococcus aureus)   . Sleep apnea, obstructive   . Vitamin D deficiency     Past Surgical History:  Procedure Laterality Date  . APPENDECTOMY  1970  . [place], 07-2001    There were no vitals filed for this visit.      Subjective Assessment - 02/03/17 0852    Subjective Shoulder is doing well.  My median nerve is still aggravated.     Currently in Pain? No/denies            [facility] PT Assessment - 02/03/17 0001      Assessment   Medical Diagnosis s/p Rt shoulder scope, SAD, DCR, RCR     Observation/Other Assessments   Other Surveys  Other Surveys   Upper Extremity Functional Index  78/80     ROM / Strength   AROM / PROM / Strength AROM;PROM;Strength     AROM   Overall AROM  Deficits   Overall AROM Comments Lt shoulder A/ROM is full   Right Shoulder Flexion 135 Degrees   Right Shoulder ABduction 126 Degrees   Right Shoulder Internal Rotation --  T3   Right Shoulder External Rotation --  to L1     PROM   Overall  PROM  Deficits   Right Shoulder Flexion 138 Degrees   Right Shoulder ABduction 100 Degrees   Right Shoulder Internal Rotation 60 Degrees   Right Shoulder External Rotation 50 Degrees     Strength   Overall Strength Deficits   Strength Assessment Site Shoulder   Right/Left Shoulder Right   Right Shoulder Flexion 4/5   Right Shoulder ABduction 4-/5   Right Shoulder Internal Rotation 4+/5   Right Shoulder External Rotation 4/5                     [facility] Adult PT Treatment/Exercise - 02/03/17 0001      Shoulder Exercises: Seated   Flexion 20 reps;Right;Strengthening   Flexion Weight (lbs) 3   Other Seated Exercises scaption and abduciton 3# 2x10     Shoulder Exercises: Standing   Other Standing Exercises UE ranger: flexion, horizontal abduction and abduction x 30 each.       Shoulder Exercises: Pulleys   Flexion 3 minutes     Shoulder Exercises: ROM/Strengthening   UBE (Upper Arm Bike) L4, x8 minutes (4/4)     Manual Therapy   Manual Therapy Passive ROM;Joint mobilization   Manual therapy comments PROM Rt shoulder  into IR/ER, abduction and flexion                   PT Short Term Goals - 01/06/17 0850      PT SHORT TERM GOAL #2   Title demonstrate Rt shoulder A/ROM flexion to > or = to 110 degrees without scapular elevation   Status Achieved     PT SHORT TERM GOAL #3   Title wean from sling 100% and report use of Rt UE for light ADLs and self-care   Status Achieved     PT SHORT TERM GOAL #4   Title report < or = to 2/10 Rt shoulder pain with use   Status Achieved           PT Long Term Goals - 02/03/17 0855      PT LONG TERM GOAL #1   Title be independent in advanced HEP   Time 8   Period Weeks   Status On-going     PT LONG TERM GOAL #2   Title reduce FOTO to < or = to 27% limitation   Baseline 46% limitation   Time 6   Period Weeks   Status On-going     PT LONG TERM GOAL #3   Title demonstrate Rt shoulder A/ROM flexion to > or  = to 145 degrees without scapular elevation to improve reaching overhead   Baseline 135 degrees   Time 6   Period Weeks   Status On-going     PT LONG TERM GOAL #4   Title demonstrate > or = to 4+/5 Rt shoulder strength to improve endurance with use   Time 6   Period Weeks   Status On-going     PT LONG TERM GOAL #5   Title demonstrate Rt shoulder P/ROM IR and ER to > or = to 60 degrees to improve mobility   Time 6   Period Weeks   Status Achieved     PT LONG TERM GOAL #6   Title use Rt UE to lift container into and out of refrigerator without weakness or limitation   Time 6   Period Weeks   Status On-going               Plan - 02/03/17 0920    Clinical Impression Statement Pt is making stready progress toward goals s/p rotator cuff repair.  Strength and A/ROM are improving and remain limited vs the Lt UE.  Pt continues to advance with HEP for strength and A/ROM.  Pt with complaints of "median nerve" pain in the Rt.  Pt scored 78/80 on upper extremity functional index measurement last visit.  Pt will continue to benefit from skilled PT to improve Rt shoulder functional strength and A/ROM s/p surgery.     Rehab Potential Good   PT Frequency 2x / week   PT Duration 6 weeks   PT Treatment/Interventions ADLs/Self Care Home Management;Cryotherapy;Electrical Stimulation;Functional mobility training;Moist Heat;Ultrasound;Therapeutic activities;Therapeutic exercise;Neuromuscular re-education;Patient/family education;Passive range of motion;Manual techniques;Taping   PT Next Visit Plan  Continue to follow post-op protocol for strength and ROM progression. Shoulder endurance and periscapular strengthening.  See what MD says.     Recommended Other Services MD signed initial summary, recert sent 29/5/28   Consulted and Agree with Plan of Care Patient      Patient will benefit from skilled therapeutic intervention in order to improve the following deficits and impairments:  Postural  dysfunction, Decreased strength, Impaired flexibility, Hypomobility, Pain, Decreased activity tolerance, Decreased endurance,  Increased muscle spasms, Decreased range of motion  Visit Diagnosis: Acute pain of right shoulder - Plan: PT plan of care cert/re-cert  Muscle weakness (generalized) - Plan: PT plan of care cert/re-cert  Stiffness of right shoulder, not elsewhere classified - Plan: PT plan of care cert/re-cert     Problem List There are no active problems to display for this patient.    Sigurd Sos, PT 02/03/17 9:45 AM  [place] [address], [place], [place], [address] Phone: [phone]   Fax:  [phone]  Name: Kevin Griffith MRN: [id number] Date of Birth: 1950-08-06

## 2017-02-04 DIAGNOSIS — S46011D Strain of muscle(s) and tendon(s) of the rotator cuff of right shoulder, subsequent encounter: Secondary | ICD-10-CM | POA: Diagnosis not present

## 2017-02-04 DIAGNOSIS — Z4789 Encounter for other orthopedic aftercare: Secondary | ICD-10-CM | POA: Diagnosis not present

## 2017-02-05 ENCOUNTER — Ambulatory Visit: Payer: 59

## 2017-02-05 DIAGNOSIS — M25511 Pain in right shoulder: Secondary | ICD-10-CM

## 2017-02-05 DIAGNOSIS — M25611 Stiffness of right shoulder, not elsewhere classified: Secondary | ICD-10-CM

## 2017-02-05 DIAGNOSIS — M6281 Muscle weakness (generalized): Secondary | ICD-10-CM | POA: Diagnosis not present

## 2017-02-05 NOTE — Therapy (Signed)
[facility] [address], [place], [place], [address] Phone: [phone]   Fax:  [phone]  Physical Therapy Treatment  Patient Details  Name: Kevin Griffith MRN: [id number] Date of Birth: Sep 12, 1950 Referring Provider: Justice Britain, MD  Encounter Date: 02/05/2017      PT End of Session - 02/05/17 0923    Visit Number 16   Number of Visits 20   Date for PT Re-Evaluation 03/20/17   Authorization Type KX modifier needed   PT Start Time 0840   PT Stop Time 0926   PT Time Calculation (min) 46 min   Activity Tolerance Patient tolerated treatment well   Behavior During Therapy [facility] for tasks assessed/performed      Past Medical History:  Diagnosis Date  . Back pain, chronic   . Cervical stenosis of spine   . DDD (degenerative disc disease), cervical   . ED (erectile dysfunction)   . Hypertriglyceridemia   . Insomnia disorder   . MRSA (methicillin resistant Staphylococcus aureus)   . Sleep apnea, obstructive   . Vitamin D deficiency     Past Surgical History:  Procedure Laterality Date  . APPENDECTOMY  1970  . [place], 07-2001    There were no vitals filed for this visit.      Subjective Assessment - 02/05/17 0846    Subjective MD visit went well.  Doing well and will reduce to 1x/wk for HEP advancement.     Currently in Pain? No/denies                         [facility] Adult PT Treatment/Exercise - 02/05/17 0001      Shoulder Exercises: Seated   Flexion 20 reps;Right;Strengthening  use of mirror to reduce scapular elevation   Flexion Weight (lbs) 3   Other Seated Exercises scaption and abduciton 3# 2x10     Shoulder Exercises: Standing   Other Standing Exercises rockwood 4 ways: green x 20 each, blue flexion/extension 2x10   Other Standing Exercises UE ranger: flexion, horizontal abduction and abduction x 30 each.       Shoulder Exercises: Pulleys   Flexion 3 minutes      Shoulder Exercises: ROM/Strengthening   UBE (Upper Arm Bike) L4, x8 minutes (4/4)     Manual Therapy   Manual Therapy Passive ROM;Joint mobilization   Manual therapy comments PROM Rt shoulder into IR/ER, abduction and flexion                 PT Education - 02/05/17 0911    Education provided Yes   Education Details median nerve glides   Person(s) Educated Patient   Methods Explanation;Demonstration;Handout   Comprehension Verbalized understanding;Returned demonstration          PT Short Term Goals - 01/06/17 0850      PT SHORT TERM GOAL #2   Title demonstrate Rt shoulder A/ROM flexion to > or = to 110 degrees without scapular elevation   Status Achieved     PT SHORT TERM GOAL #3   Title wean from sling 100% and report use of Rt UE for light ADLs and self-care   Status Achieved     PT SHORT TERM GOAL #4   Title report < or = to 2/10 Rt shoulder pain with use   Status Achieved           PT Long Term Goals - 02/03/17 6599  PT LONG TERM GOAL #1   Title be independent in advanced HEP   Time 8   Period Weeks   Status On-going     PT LONG TERM GOAL #2   Title reduce FOTO to < or = to 27% limitation   Baseline 46% limitation   Time 6   Period Weeks   Status On-going     PT LONG TERM GOAL #3   Title demonstrate Rt shoulder A/ROM flexion to > or = to 145 degrees without scapular elevation to improve reaching overhead   Baseline 135 degrees   Time 6   Period Weeks   Status On-going     PT LONG TERM GOAL #4   Title demonstrate > or = to 4+/5 Rt shoulder strength to improve endurance with use   Time 6   Period Weeks   Status On-going     PT LONG TERM GOAL #5   Title demonstrate Rt shoulder P/ROM IR and ER to > or = to 60 degrees to improve mobility   Time 6   Period Weeks   Status Achieved     PT LONG TERM GOAL #6   Title use Rt UE to lift container into and out of refrigerator without weakness or limitation   Time 6   Period Weeks    Status On-going               Plan - 02/05/17 0924    Clinical Impression Statement Pt is making steady progress toward goals s/p rotator cuff repair.  Strength and A/ROM are improving and remain limited vs the Lt UE.  Pt continues to advance with HEP and PT advanced theraband for home use.  PT issued median nerve glides for HEP to address this pain.  Pt will continue to benefit from skilled PT for Rt shoulder strength and A/ROM progression s/p RTC repair.     Rehab Potential Good   PT Frequency 2x / week   PT Duration 6 weeks   PT Treatment/Interventions ADLs/Self Care Home Management;Cryotherapy;Electrical Stimulation;Functional mobility training;Moist Heat;Ultrasound;Therapeutic activities;Therapeutic exercise;Neuromuscular re-education;Patient/family education;Passive range of motion;Manual techniques;Taping   PT Next Visit Plan  Continue to follow post-op protocol for strength and ROM progression. Shoulder endurance and periscapular strengthening.     Consulted and Agree with Plan of Care Patient      Patient will benefit from skilled therapeutic intervention in order to improve the following deficits and impairments:  Postural dysfunction, Decreased strength, Impaired flexibility, Hypomobility, Pain, Decreased activity tolerance, Decreased endurance, Increased muscle spasms, Decreased range of motion  Visit Diagnosis: Acute pain of right shoulder  Muscle weakness (generalized)  Stiffness of right shoulder, not elsewhere classified     Problem List There are no active problems to display for this patient.   Sigurd Sos, PT 02/05/17 9:27 AM  [place] [facility] [address] [place], [place], [address] Phone: [phone]   Fax:  [phone]  Name: Kevin Griffith MRN: [id number] Date of Birth: June 13, 1950

## 2017-02-05 NOTE — Patient Instructions (Addendum)
MEDIAN NERVE: Dynamic Mobility I    With palms together, move hands in an imaginary S from head to waist. Keep shoulders down. Do ___ sets of ___ repetitions per session. Do ___ sessions per day.  Copyright  VHI. All rights reserved.  MEDIAN NERVE: Flossing I    With right elbow bent and palm facing up as if holding a tray, head tilted away. Simultaneously straighten arm and tilt head toward involved shoulder. Do ___ sets of ___ repetitions per session. Do ___ sessions per day.  Copyright  VHI. All rights reserved.  [place] [address] [place], [place] [address] Phone # [phone] Fax [phone]

## 2017-02-10 ENCOUNTER — Ambulatory Visit: Payer: 59

## 2017-02-10 DIAGNOSIS — M6281 Muscle weakness (generalized): Secondary | ICD-10-CM | POA: Diagnosis not present

## 2017-02-10 DIAGNOSIS — M25611 Stiffness of right shoulder, not elsewhere classified: Secondary | ICD-10-CM | POA: Diagnosis not present

## 2017-02-10 DIAGNOSIS — M25511 Pain in right shoulder: Secondary | ICD-10-CM

## 2017-02-10 NOTE — Therapy (Signed)
[facility] [address], [place], [place], [address] Phone: [phone]   Fax:  [phone]  Physical Therapy Treatment  Patient Details  Name: Kevin Griffith MRN: [id number] Date of Birth: 07-Dec-1950 Referring Provider: Justice Britain, MD  Encounter Date: 02/10/2017      PT End of Session - 02/10/17 0837    Visit Number 17   Number of Visits 20   Date for PT Re-Evaluation 03/20/17   Authorization Type KX modifier needed, G-codes   PT Start Time 0758   PT Stop Time 0839   PT Time Calculation (min) 41 min   Activity Tolerance Patient tolerated treatment well   Behavior During Therapy [facility] for tasks assessed/performed      Past Medical History:  Diagnosis Date  . Back pain, chronic   . Cervical stenosis of spine   . DDD (degenerative disc disease), cervical   . ED (erectile dysfunction)   . Hypertriglyceridemia   . Insomnia disorder   . MRSA (methicillin resistant Staphylococcus aureus)   . Sleep apnea, obstructive   . Vitamin D deficiency     Past Surgical History:  Procedure Laterality Date  . APPENDECTOMY  1970  . [place], 07-2001    There were no vitals filed for this visit.      Subjective Assessment - 02/10/17 0809    Subjective I didn't sleep well last night.     Patient Stated Goals improve Rt shoulder strength, return to regular use of Rt UE   Currently in Pain? No/denies                         [facility] Adult PT Treatment/Exercise - 02/10/17 0001      Shoulder Exercises: Seated   External Rotation Strengthening;Both;20 reps;Theraband   Theraband Level (Shoulder External Rotation) Level 2 (Red)   Flexion 20 reps;Right;Strengthening  use of wall to stabilize scapula   Flexion Weight (lbs) 3   Other Seated Exercises scaption and abduciton 3# 2x10     Shoulder Exercises: Sidelying   External Rotation Strengthening;Both;20 reps   External Rotation Weight (lbs) 3      Shoulder Exercises: Standing   Row Strengthening;Left;20 reps   Theraband Level (Shoulder Row) Level 3 (Green)   Row Limitations 3 positions with row x20 each   Other Standing Exercises UE ranger: flexion, horizontal abduction and abduction x 30 each.       Shoulder Exercises: Pulleys   Flexion 3 minutes     Shoulder Exercises: ROM/Strengthening   UBE (Upper Arm Bike) L0  x8 minutes (4/4)- Rt arm only     Manual Therapy   Manual Therapy --   Manual therapy comments --                  PT Short Term Goals - 01/06/17 0850      PT SHORT TERM GOAL #2   Title demonstrate Rt shoulder A/ROM flexion to > or = to 110 degrees without scapular elevation   Status Achieved     PT SHORT TERM GOAL #3   Title wean from sling 100% and report use of Rt UE for light ADLs and self-care   Status Achieved     PT SHORT TERM GOAL #4   Title report < or = to 2/10 Rt shoulder pain with use   Status Achieved           PT Long Term Goals -  02/03/17 0855      PT LONG TERM GOAL #1   Title be independent in advanced HEP   Time 8   Period Weeks   Status On-going     PT LONG TERM GOAL #2   Title reduce FOTO to < or = to 27% limitation   Baseline 46% limitation   Time 6   Period Weeks   Status On-going     PT LONG TERM GOAL #3   Title demonstrate Rt shoulder A/ROM flexion to > or = to 145 degrees without scapular elevation to improve reaching overhead   Baseline 135 degrees   Time 6   Period Weeks   Status On-going     PT LONG TERM GOAL #4   Title demonstrate > or = to 4+/5 Rt shoulder strength to improve endurance with use   Time 6   Period Weeks   Status On-going     PT LONG TERM GOAL #5   Title demonstrate Rt shoulder P/ROM IR and ER to > or = to 60 degrees to improve mobility   Time 6   Period Weeks   Status Achieved     PT LONG TERM GOAL #6   Title use Rt UE to lift container into and out of refrigerator without weakness or limitation   Time 6   Period  Weeks   Status On-going               Plan - 02/10/17 3419    Clinical Impression Statement Pt is making steady progress s/p rotator cuff repair.  PT focused on scapular stabilization today as pt is concerned about noise coming from shoulder with movement.  Pt reports less median nerve pain overall.  Pt requires verbal and tactile cues for scapular position with exercise today.  Pt will continue to benefit from skilled PT for Rt shoulder strength and A/ROM progression s/p RTC repair.     Rehab Potential Good   PT Frequency 2x / week   PT Duration 6 weeks   PT Treatment/Interventions ADLs/Self Care Home Management;Cryotherapy;Electrical Stimulation;Functional mobility training;Moist Heat;Ultrasound;Therapeutic activities;Therapeutic exercise;Neuromuscular re-education;Patient/family education;Passive range of motion;Manual techniques;Taping   PT Next Visit Plan  Continue to follow post-op protocol for strength and ROM progression. Shoulder endurance and periscapular strengthening.     Consulted and Agree with Plan of Care Patient      Patient will benefit from skilled therapeutic intervention in order to improve the following deficits and impairments:  Postural dysfunction, Decreased strength, Impaired flexibility, Hypomobility, Pain, Decreased activity tolerance, Decreased endurance, Increased muscle spasms, Decreased range of motion  Visit Diagnosis: Acute pain of right shoulder  Muscle weakness (generalized)  Stiffness of right shoulder, not elsewhere classified     Problem List There are no active problems to display for this patient.  Sigurd Sos, PT 02/10/17 8:41 AM  [facility] [address], [place], [place], [address] Phone: [phone]   Fax:  [phone]  Name: Kevin Griffith MRN: [id number] Date of Birth: March 20, 1951

## 2017-02-17 ENCOUNTER — Ambulatory Visit: Payer: 59

## 2017-02-17 DIAGNOSIS — M25511 Pain in right shoulder: Secondary | ICD-10-CM | POA: Diagnosis not present

## 2017-02-17 DIAGNOSIS — M25611 Stiffness of right shoulder, not elsewhere classified: Secondary | ICD-10-CM | POA: Diagnosis not present

## 2017-02-17 DIAGNOSIS — M6281 Muscle weakness (generalized): Secondary | ICD-10-CM

## 2017-02-17 NOTE — Therapy (Signed)
[facility] [address] [place], [place], [address] Phone: [phone]   Fax:  [phone]  Physical Therapy Treatment  Patient Details  Name: Kevin Griffith MRN: [id number] Date of Birth: [birth year] Referring Provider: Justice Britain, MD  Encounter Date: 02/17/2017      PT End of Session - 02/17/17 0921    Visit Number 18   Number of Visits 20   Date for PT Re-Evaluation 03/20/17   Authorization Type KX modifier needed, G-codes   PT Start Time 0830   PT Stop Time 0918   PT Time Calculation (min) 48 min   Behavior During Therapy [facility] for tasks assessed/performed      Past Medical History:  Diagnosis Date  . Back pain, chronic   . Cervical stenosis of spine   . DDD (degenerative disc disease), cervical   . ED (erectile dysfunction)   . Hypertriglyceridemia   . Insomnia disorder   . MRSA (methicillin resistant Staphylococcus aureus)   . Sleep apnea, obstructive   . Vitamin D deficiency     Past Surgical History:  Procedure Laterality Date  . APPENDECTOMY  1970  . [place], 07-2001    There were no vitals filed for this visit.      Subjective Assessment - 02/17/17 0849    Subjective No pain over the weekend.  Sore after gym workout and yardwork yesterday.     Patient Stated Goals improve Rt shoulder strength, return to regular use of Rt UE   Currently in Pain? No/denies            [facility] PT Assessment - 02/17/17 0001      PROM   Right Shoulder Flexion 153 Degrees   Right Shoulder External Rotation 54 Degrees                     [facility] Adult PT Treatment/Exercise - 02/17/17 0001      Shoulder Exercises: Seated   Flexion 20 reps;Right;Strengthening  use of wall to stabilize scapula   Flexion Weight (lbs) 4   Other Seated Exercises scaption and abduciton 4# 2x10  tactile cues for scapular elevation     Shoulder Exercises: Sidelying   External Rotation  Strengthening;Both;20 reps   External Rotation Weight (lbs) 4     Shoulder Exercises: Standing   Other Standing Exercises wall push ups with red ball: 2x10  instability and scapular elevation   Other Standing Exercises UE ranger: flexion, horizontal abduction and abduction x 30 each.       Shoulder Exercises: Pulleys   Flexion 3 minutes     Shoulder Exercises: ROM/Strengthening   UBE (Upper Arm Bike) L4 x 10 minutes (5/5)     Manual Therapy   Manual Therapy Passive ROM;Joint mobilization   Manual therapy comments PROM Rt shoulder into IR/ER, abduction and flexion                   PT Short Term Goals - 01/06/17 0850      PT SHORT TERM GOAL #2   Title demonstrate Rt shoulder A/ROM flexion to > or = to 110 degrees without scapular elevation   Status Achieved     PT SHORT TERM GOAL #3   Title wean from sling 100% and report use of Rt UE for light ADLs and self-care   Status Achieved     PT SHORT TERM GOAL #4   Title report < or = to 2/10 Rt  shoulder pain with use   Status Achieved           PT Long Term Goals - 02/17/17 0853      PT LONG TERM GOAL #6   Title use Rt UE to lift container into and out of refrigerator without weakness or limitation   Status Achieved               Plan - 02/17/17 0855    Clinical Impression Statement Pt is making steady progress s/p rotator cuff repair.  Pt able to tolerate advanced weights with 3 way raises.  Pt no longer reports median nerve pain or noise coming from the joint with movement.  Pt requires verbal and tactile cues to reduce speed with exercises and to reduce scapular elevation.  Pt will continue to benefit from skilled PT for stability, strength and endurance of Rt shoulder.     PT Frequency 2x / week   PT Duration 6 weeks   PT Treatment/Interventions ADLs/Self Care Home Management;Cryotherapy;Electrical Stimulation;Functional mobility training;Moist Heat;Ultrasound;Therapeutic activities;Therapeutic  exercise;Neuromuscular re-education;Patient/family education;Passive range of motion;Manual techniques;Taping   PT Next Visit Plan  Continue to follow post-op protocol for strength and ROM progression. Shoulder endurance and periscapular strengthening.     Consulted and Agree with Plan of Care Patient      Patient will benefit from skilled therapeutic intervention in order to improve the following deficits and impairments:  Postural dysfunction, Decreased strength, Impaired flexibility, Hypomobility, Pain, Decreased activity tolerance, Decreased endurance, Increased muscle spasms, Decreased range of motion  Visit Diagnosis: Acute pain of right shoulder  Muscle weakness (generalized)  Stiffness of right shoulder, not elsewhere classified     Problem List There are no active problems to display for this patient.    Sigurd Sos, PT 02/17/17 9:22 AM  [facility] [address], [place], [place], [address] Phone: [phone]   Fax:  [phone]  Name: Kevin Griffith MRN: [id number] Date of Birth: [birth year]

## 2017-02-24 ENCOUNTER — Ambulatory Visit: Payer: 59

## 2017-02-24 DIAGNOSIS — M25511 Pain in right shoulder: Secondary | ICD-10-CM | POA: Diagnosis not present

## 2017-02-24 DIAGNOSIS — M6281 Muscle weakness (generalized): Secondary | ICD-10-CM | POA: Diagnosis not present

## 2017-02-24 DIAGNOSIS — M25611 Stiffness of right shoulder, not elsewhere classified: Secondary | ICD-10-CM | POA: Diagnosis not present

## 2017-02-24 NOTE — Therapy (Signed)
[facility] [address], [place], [place], [address] Phone: [phone]   Fax:  [phone]  Physical Therapy Treatment  Patient Details  Name: Kevin Griffith MRN: [id number] Date of Birth: [birth year] Referring Provider: Justice Britain, MD  Encounter Date: 02/24/2017      PT End of Session - 02/24/17 0919    Visit Number 19   Number of Visits 20   Date for PT Re-Evaluation 03/20/17   Authorization Type KX modifier needed, G-codes   PT Start Time 0841   PT Stop Time 0921   PT Time Calculation (min) 40 min   Activity Tolerance Patient tolerated treatment well   Behavior During Therapy [facility] for tasks assessed/performed      Past Medical History:  Diagnosis Date  . Back pain, chronic   . Cervical stenosis of spine   . DDD (degenerative disc disease), cervical   . ED (erectile dysfunction)   . Hypertriglyceridemia   . Insomnia disorder   . MRSA (methicillin resistant Staphylococcus aureus)   . Sleep apnea, obstructive   . Vitamin D deficiency     Past Surgical History:  Procedure Laterality Date  . APPENDECTOMY  1970  . [place], 07-2001    There were no vitals filed for this visit.      Subjective Assessment - 02/24/17 0848    Subjective No pain today.     Currently in Pain? No/denies            [facility] PT Assessment - 02/24/17 0001      Observation/Other Assessments   Focus on Therapeutic Outcomes (FOTO)  18% limitation                     [facility] Adult PT Treatment/Exercise - 02/24/17 0001      Shoulder Exercises: Seated   Flexion 20 reps;Right;Strengthening  use of wall to stabilize scapula   Flexion Weight (lbs) 4   Other Seated Exercises scaption and abduciton 4# 2x10  tactile cues for scapular elevation     Shoulder Exercises: Sidelying   External Rotation Strengthening;Both;20 reps   External Rotation Weight (lbs) 4     Shoulder Exercises: Standing   Row  Strengthening  max reps to fatigue. 2 sets   Row Weight (lbs) 45# various hand positions   Shoulder Elevation Limitations snow angels on wall: x10   Other Standing Exercises wall push ups with red ball: 2x10  instability and scapular elevation   Other Standing Exercises UE ranger: flexion, horizontal abduction and abduction x 30 each.       Shoulder Exercises: Pulleys   Flexion 3 minutes     Shoulder Exercises: ROM/Strengthening   UBE (Upper Arm Bike) L4 x 10 minutes (5/5)                  PT Short Term Goals - 01/06/17 0850      PT SHORT TERM GOAL #2   Title demonstrate Rt shoulder A/ROM flexion to > or = to 110 degrees without scapular elevation   Status Achieved     PT SHORT TERM GOAL #3   Title wean from sling 100% and report use of Rt UE for light ADLs and self-care   Status Achieved     PT SHORT TERM GOAL #4   Title report < or = to 2/10 Rt shoulder pain with use   Status Achieved           PT  Long Term Goals - 02/24/17 0851      PT LONG TERM GOAL #2   Title reduce FOTO to < or = to 27% limitation   Baseline 18% limitation   Status Achieved     PT LONG TERM GOAL #3   Title demonstrate Rt shoulder A/ROM flexion to > or = to 145 degrees without scapular elevation to improve reaching overhead   Baseline scapular elevation   Time 6   Period Weeks   Status On-going     PT LONG TERM GOAL #5   Title demonstrate Rt shoulder P/ROM IR and ER to > or = to 60 degrees to improve mobility   Status Achieved               Plan - 02/24/17 0858    Clinical Impression Statement Pt continues to make steady progress with strength progression.  Pt requires tactile cues to reduce scapular elevation with flexion and abduction with 4# weights. Pt demonstrates scapular instability with push ups on ball on wall.  FOTO is improved to 18% limitation.  Pt will continue to benefit from skilled PT for scapular stabilization, glenohumeral strength and endurance.      Rehab Potential Good   PT Frequency 2x / week   PT Duration 6 weeks   PT Treatment/Interventions ADLs/Self Care Home Management;Cryotherapy;Electrical Stimulation;Functional mobility training;Moist Heat;Ultrasound;Therapeutic activities;Therapeutic exercise;Neuromuscular re-education;Patient/family education;Passive range of motion;Manual techniques;Taping   PT Next Visit Plan  Continue to follow post-op protocol for strength and ROM progression. Shoulder endurance and periscapular strengthening.     Consulted and Agree with Plan of Care Patient      Patient will benefit from skilled therapeutic intervention in order to improve the following deficits and impairments:  Postural dysfunction, Decreased strength, Impaired flexibility, Hypomobility, Pain, Decreased activity tolerance, Decreased endurance, Increased muscle spasms, Decreased range of motion  Visit Diagnosis: Acute pain of right shoulder  Muscle weakness (generalized)  Stiffness of right shoulder, not elsewhere classified     Problem List There are no active problems to display for this patient.    Sigurd Sos, PT 02/24/17 9:26 AM  [facility] [address] [place], [place], [address] Phone: [phone]   Fax:  [phone]  Name: Kevin Griffith MRN: [id number] Date of Birth: 01/15/1951

## 2017-03-03 ENCOUNTER — Ambulatory Visit: Payer: 59 | Attending: Orthopedic Surgery

## 2017-03-03 DIAGNOSIS — M6281 Muscle weakness (generalized): Secondary | ICD-10-CM | POA: Diagnosis not present

## 2017-03-03 DIAGNOSIS — M25611 Stiffness of right shoulder, not elsewhere classified: Secondary | ICD-10-CM | POA: Insufficient documentation

## 2017-03-03 DIAGNOSIS — M25511 Pain in right shoulder: Secondary | ICD-10-CM | POA: Diagnosis not present

## 2017-03-03 NOTE — Therapy (Signed)
[facility] [address] [place], [place], [address] Phone: [phone]   Fax:  [phone]  Physical Therapy Treatment  Patient Details  Name: Kevin Griffith MRN: [id number] Date of Birth: Aug 23, 1950 Referring Provider: Justice Britain, MD   Encounter Date: 03/03/2017  PT End of Session - 03/03/17 0913    Visit Number  20    Authorization Type  KX modifier needed, G-codes    PT Start Time  0840    PT Stop Time  0921    PT Time Calculation (min)  41 min    Activity Tolerance  Patient tolerated treatment well    Behavior During Therapy  [facility] for tasks assessed/performed       Past Medical History:  Diagnosis Date  . Back pain, chronic   . Cervical stenosis of spine   . DDD (degenerative disc disease), cervical   . ED (erectile dysfunction)   . Hypertriglyceridemia   . Insomnia disorder   . MRSA (methicillin resistant Staphylococcus aureus)   . Sleep apnea, obstructive   . Vitamin D deficiency     Past Surgical History:  Procedure Laterality Date  . APPENDECTOMY  1970  . [place], 07-2001    There were no vitals filed for this visit.  Subjective Assessment - 03/03/17 0844    Subjective  Doing well with exercises.  Limited endurance of Rt shoulder.  Ready for D/C and will continue to exercise to improve strength.      Currently in Pain?  No/denies    Pain Descriptors / Indicators  Sore         [facility] PT Assessment - 03/03/17 0001      Assessment   Medical Diagnosis  s/p Rt shoulder scope, SAD, DCR, RCR    Onset Date/Surgical Date  11/04/16      Prior Function   Level of Independence  Independent with basic ADLs      Observation/Other Assessments   Focus on Therapeutic Outcomes (FOTO)   18% limitation      AROM   Overall AROM   Deficits    Right Shoulder Flexion  140 Degrees    Right Shoulder ABduction  130 Degrees      PROM   Overall PROM   Deficits    PROM Assessment Site   Shoulder    Right Shoulder Internal Rotation  51 Degrees    Right Shoulder External Rotation  69 Degrees      Strength   Right Shoulder Flexion  4/5    Right Shoulder ABduction  4/5    Right Shoulder Internal Rotation  4+/5    Right Shoulder External Rotation  4/5                  [facility] Adult PT Treatment/Exercise - 03/03/17 0001      Shoulder Exercises: Seated   Flexion  20 reps;Right;Strengthening use of wall to stabilize scapula   use of wall to stabilize scapula   Flexion Weight (lbs)  4    Other Seated Exercises  scaption and abduciton 4# 2x10 tactile cues for scapular elevation   tactile cues for scapular elevation     Shoulder Exercises: Sidelying   External Rotation  Strengthening;Both;20 reps    External Rotation Weight (lbs)  4      Shoulder Exercises: Standing   Row  Strengthening max reps to fatigue. 2 sets   max reps to fatigue. 2 sets   Row Weight (lbs)  65# various hand positions    Other Standing Exercises  wall push ups with red ball: 2x10 instability and scapular elevation   instability and scapular elevation   Other Standing Exercises  UE ranger: flexion, horizontal abduction and abduction x 30 each.        Shoulder Exercises: Pulleys   Flexion  3 minutes      Shoulder Exercises: ROM/Strengthening   UBE (Upper Arm Bike)  L4 x 8 minutes (4/4)               PT Short Term Goals - 01/06/17 0850      PT SHORT TERM GOAL #2   Title  demonstrate Rt shoulder A/ROM flexion to > or = to 110 degrees without scapular elevation    Status  Achieved      PT SHORT TERM GOAL #3   Title  wean from sling 100% and report use of Rt UE for light ADLs and self-care    Status  Achieved      PT SHORT TERM GOAL #4   Title  report < or = to 2/10 Rt shoulder pain with use    Status  Achieved        PT Long Term Goals - 19-Mar-2017 0847      PT LONG TERM GOAL #1   Title  be independent in advanced HEP    Time  --    Period  --    Status  Achieved       PT LONG TERM GOAL #2   Title  reduce FOTO to < or = to 27% limitation    Status  Achieved      PT LONG TERM GOAL #3   Title  demonstrate Rt shoulder A/ROM flexion to > or = to 145 degrees without scapular elevation to improve reaching overhead    Baseline  140    Status  Partially Met      PT LONG TERM GOAL #4   Title  demonstrate > or = to 4+/5 Rt shoulder strength to improve endurance with use    Status  Partially Met      PT LONG TERM GOAL #5   Title  demonstrate Rt shoulder P/ROM IR and ER to > or = to 60 degrees to improve mobility    Status  Achieved      PT LONG TERM GOAL #6   Title  use Rt UE to lift container into and out of refrigerator without weakness or limitation    Status  Achieved            Plan - Mar 19, 2017 0849    Clinical Impression Statement  Pt will D/C to HEP today.  Pt requires tactile and verbal cues to reduce scapular elevation and substitution with flexion and abduction exercises on the Rt.  Pt will continue to gym exercises and HEP for strength and endurance gains.  Pt denies any functional limitations at this time regarding Rt UE.      PT Next Visit Plan  D/C PT to HEP    Recommended Other Services  initial and recertification is signed    Consulted and Agree with Plan of Care  Patient       Patient will benefit from skilled therapeutic intervention in order to improve the following deficits and impairments:     Visit Diagnosis: Acute pain of right shoulder  Muscle weakness (generalized)  Stiffness of right shoulder, not elsewhere classified   G-Codes - 03/19/17 1660  Functional Assessment Tool Used (Outpatient Only)  FOTO: 18% limitation    Functional Limitation  Other PT primary    Other PT Primary Current Status (J1552)  --    Other PT Primary Goal Status (C8022)  At least 20 percent but less than 40 percent impaired, limited or restricted    Other PT Primary Discharge Status (V3612)  At least 1 percent but less than 20 percent  impaired, limited or restricted       Problem List There are no active problems to display for this patient.  PHYSICAL THERAPY DISCHARGE SUMMARY  Visits from Start of Care: 20  Current functional level related to goals / functional outcomes: See above for current status.  Pt with Rt shoulder A/ROM deficits and strength deficits for endurance tasks.  Pt has HEP in place for continued gains.     Remaining deficits: See above.     Education / Equipment: HEP Plan: Patient agrees to discharge.  Patient goals were partially met. Patient is being discharged due to being pleased with the current functional level.  ?????         Sigurd Sos, PT 03/03/17 9:16 AM  [facility] [address] [place], [place], [address] Phone: [phone]   Fax:  [phone]  Name: Kevin Griffith MRN: [id number] Date of Birth: [birth year]

## 2017-07-21 DIAGNOSIS — Z125 Encounter for screening for malignant neoplasm of prostate: Secondary | ICD-10-CM | POA: Diagnosis not present

## 2017-07-21 DIAGNOSIS — Z0000 Encounter for general adult medical examination without abnormal findings: Secondary | ICD-10-CM | POA: Diagnosis not present

## 2017-07-21 DIAGNOSIS — R7301 Impaired fasting glucose: Secondary | ICD-10-CM | POA: Diagnosis not present

## 2017-07-21 DIAGNOSIS — E559 Vitamin D deficiency, unspecified: Secondary | ICD-10-CM | POA: Diagnosis not present

## 2017-07-31 DIAGNOSIS — R3121 Asymptomatic microscopic hematuria: Secondary | ICD-10-CM | POA: Diagnosis not present

## 2017-07-31 DIAGNOSIS — M5030 Other cervical disc degeneration, unspecified cervical region: Secondary | ICD-10-CM | POA: Diagnosis not present

## 2017-07-31 DIAGNOSIS — N529 Male erectile dysfunction, unspecified: Secondary | ICD-10-CM | POA: Diagnosis not present

## 2017-07-31 DIAGNOSIS — Z125 Encounter for screening for malignant neoplasm of prostate: Secondary | ICD-10-CM | POA: Diagnosis not present

## 2017-07-31 DIAGNOSIS — Z0000 Encounter for general adult medical examination without abnormal findings: Secondary | ICD-10-CM | POA: Diagnosis not present

## 2017-07-31 DIAGNOSIS — D126 Benign neoplasm of colon, unspecified: Secondary | ICD-10-CM | POA: Diagnosis not present

## 2017-07-31 DIAGNOSIS — R030 Elevated blood-pressure reading, without diagnosis of hypertension: Secondary | ICD-10-CM | POA: Diagnosis not present

## 2017-07-31 DIAGNOSIS — R05 Cough: Secondary | ICD-10-CM | POA: Diagnosis not present

## 2017-07-31 DIAGNOSIS — Z1212 Encounter for screening for malignant neoplasm of rectum: Secondary | ICD-10-CM | POA: Diagnosis not present

## 2017-07-31 DIAGNOSIS — R7301 Impaired fasting glucose: Secondary | ICD-10-CM | POA: Diagnosis not present

## 2017-07-31 DIAGNOSIS — Z1389 Encounter for screening for other disorder: Secondary | ICD-10-CM | POA: Diagnosis not present

## 2017-08-17 ENCOUNTER — Ambulatory Visit: Payer: 59 | Admitting: Internal Medicine

## 2017-08-17 ENCOUNTER — Encounter: Payer: Self-pay | Admitting: Internal Medicine

## 2017-08-17 VITALS — BP 160/80 | HR 75 | Ht 72.0 in | Wt 238.0 lb

## 2017-08-17 DIAGNOSIS — I480 Paroxysmal atrial fibrillation: Secondary | ICD-10-CM

## 2017-08-17 NOTE — Patient Instructions (Addendum)
  Medication Instructions:  Your physician recommends that you continue on your current medications as directed. Please refer to the Current Medication list given to you today.  Labwork: None ordered.  Testing/Procedures: None ordered.  Follow-Up: Your physician wants you to follow-up in: One Year with Dr Caryl Comes. You will receive a reminder letter in the mail two months in advance. If you don't receive a letter, please call our office to schedule the follow-up appointment.  Please schedule and ECHO to be preformed a couple of weeks before your office visit.   Any Other Special Instructions Will Be Listed Below (If Applicable).     If you need a refill on your cardiac medications before your next appointment, please call your pharmacy.

## 2017-08-17 NOTE — Progress Notes (Signed)
ELECTROPHYSIOLOGY CONSULT NOTE  Patient ID: Kevin Griffith, MRN: [id number], DOB/AGE: [birth year] 68 y.o. Admit date: (Not on file) Date of Consult: 08/17/2017  Primary Physician: Crist Infante, MD Primary Cardiologist: new     Kevin Griffith is a 67 y.o. male who is being seen today for the evaluation of Atrial fib at the request of Dr MO.    HPI Kevin Griffith is a 67 y.o. male referred for atrial fibrillation  During shoulder surgery, 7/18 he he had atrial fibrillation.  He had a recurrence 2/18 lasting 16 hours documented with his [employer] monitor.  He is otherwise had documented PACs and PVCs.  He has a history of palpitations dating back to his 51s.  History of sleep apnea documented in the early 2000's with subsequent resolution.  He has become aficianado of vitamins and minerals.  He credits it with resolving his sleep apnea.  With growing 2 inches.  With resolution of his cervical neck compression.  He denies shortness of breath but is not fit.  He has had problems since his shoulder surgery last summer and his brother's death in 2022-06-16.  Blood pressures at home are in the 100-110 range.  Thromboembolic risk factors ( age -12) for a CHADSVASc Score of 1   Past Medical History:  Diagnosis Date  . Asymptomatic microscopic hematuria   . Back pain, chronic   . Cervical stenosis of spine   . Colon polyps   . DDD (degenerative disc disease), cervical   . DDD (degenerative disc disease), lumbar   . Drug rash   . ED (erectile dysfunction)   . Hyperbilirubinemia   . Hypertriglyceridemia   . IFG (impaired fasting glucose)   . Insomnia disorder   . Lactose intolerance   . Mood change   . MRSA (methicillin resistant Staphylococcus aureus)   . Palpitations   . Paroxysmal A-fib ([place])   . Skin problem   . Sleep apnea, obstructive   . Vitamin D deficiency       Surgical History:  Past Surgical History:  Procedure Laterality Date  . APPENDECTOMY  1970  . [place], 07-2001  . HEMIDISCECECTOMY       Home Meds: Prior to Admission medications   Medication Sig Start Date End Date Taking? Authorizing Provider  Ascorbic Acid (VITAMIN C PO) Take 3,000 mg by mouth daily.    [provider]  B Complex Vitamins (VITAMIN-B COMPLEX PO) Take 1 tablet by mouth daily.    [provider]  Cholecalciferol (VITAMIN D3) 10000 units capsule Take 10,000 Units by mouth daily.    [provider]  GELATIN PO Take 1 tablet by mouth daily.    [provider]  ibuprofen (ADVIL,MOTRIN) 200 MG tablet Take 200 mg by mouth every 6 (six) hours as needed.    [provider]  Levomefolate Glucosamine (METHYLFOLATE PO) Take 1 tablet by mouth daily.    [provider]  Menaquinone-7 (VITAMIN K2 PO) Take 500 mcg by mouth daily.    [provider]  Methylsulfonylmethane (MSM PO) Take 1 tablet by mouth daily.    [provider]  Multiple Minerals (MINERAL COMPLEX PO) Take 1 tablet by mouth daily.    [provider]  VITAMIN A PO Take 4,000 Units by mouth daily.    [provider]  vitamin B-12 (CYANOCOBALAMIN) 1000 MCG tablet Take 1,000 mcg by mouth daily.    [provider]  vitamin E 400  UNIT capsule Take 400 Units by mouth daily.    [provider]    Allergies:  Allergies  Allergen Reactions  . Acetaminophen Other (See Comments)    Increases blood pressure   . Sulfa Antibiotics Rash    Social History   Socioeconomic History  . Marital status: Married    Spouse name: Sharee Pimple  . Number of children: 2  . Years of education: Not on file  . Highest education level: Not on file  Occupational History  . Occupation: Architectural technologist     Comment: packaging/ [employer]  . Financial resource strain: Not on file  . Food insecurity:    Worry: Not on file    Inability: Not on file  . Transportation needs:    Medical: Not on file    Non-medical:  Not on file  Tobacco Use  . Smoking status: Never Smoker  . Smokeless tobacco: Never Used  Substance and Sexual Activity  . Alcohol use: Yes    Comment: occ wine or beer  . Drug use: No  . Sexual activity: Not on file  Lifestyle  . Physical activity:    Days per week: Not on file    Minutes per session: Not on file  . Stress: Not on file  Relationships  . Social connections:    Talks on phone: Not on file    Gets together: Not on file    Attends religious service: Not on file    Active member of club or organization: Not on file    Attends meetings of clubs or organizations: Not on file    Relationship status: Not on file  . Intimate partner violence:    Fear of current or ex partner: Not on file    Emotionally abused: Not on file    Physically abused: Not on file    Forced sexual activity: Not on file  Other Topics Concern  . Not on file  Social History Narrative  . Not on file     Family History  Problem Relation Age of Onset  . Cerebral aneurysm Mother   . Hypertension Mother   . Heart attack Sister 34       oldest sister  . Crohn's disease Brother   . Diabetes Mellitus II Sister   . Heart disease Sister   . Crohn's disease Brother      ROS:  Please see the history of present illness.     All other systems reviewed and negative.    Physical Exam:   Blood pressure (!) 160/80, pulse 75, height 6' (1.829 m), weight 238 lb (108 kg), SpO2 99 %. General: Well developed, well nourished male in no acute distress. Head: Normocephalic, atraumatic, sclera non-icteric, no xanthomas, nares are without discharge. EENT: normal  Lymph Nodes:  none Neck: Negative for carotid bruits. JVD not elevated. Back:without scoliosis kyphosis Lungs: Clear bilaterally to auscultation without wheezes, rales, or rhonchi. Breathing is unlabored. Heart: RRR with S1 S2. No   murmur . No rubs, or gallops appreciated. Abdomen: Soft, non-tender, non-distended with normoactive bowel sounds. No  hepatomegaly. No rebound/guarding. No obvious abdominal masses. Msk:  Strength and tone appear normal for age. Extremities: No clubbing or cyanosis. No edema.  Distal pedal pulses are 2+ and equal bilaterally. Skin: Warm and Dry Neuro: Alert and oriented X 3. CN III-XII intact Grossly normal sensory and motor function . Psych:  Responds to questions appropriately with a normal affect.      Labs:  Cardiac Enzymes No results for input(s): CKTOTAL, CKMB, TROPONINI in the last 72 hours. CBC No results found for: WBC, HGB, HCT, MCV, PLT PROTIME: No results for input(s): LABPROT, INR in the last 72 hours. Chemistry No results for input(s): NA, K, CL, CO2, BUN, CREATININE, CALCIUM, PROT, BILITOT, ALKPHOS, ALT, AST, GLUCOSE in the last 168 hours.  Invalid input(s): LABALBU Lipids No results found for: CHOL, HDL, LDLCALC, TRIG BNP No results found for: PROBNP Thyroid Function Tests: No results for input(s): TSH, T4TOTAL, T3FREE, THYROIDAB in the last 72 hours.  Invalid input(s): FREET3 Miscellaneous No results found for: DDIMER  Radiology/Studies:  No results found.  EKG: ECG demonstrates sinus rhythm at 73 Intervals 18/08/38 PVC with a right bundle inferior axis morphology  Telemetry strips reviewed from [employer] Atrial fibrillation Sinus rhythm with PVCs   Assessment and Plan:  Atrial fibrillation-paroxysmal  PVCs-RVOT    The patient has paroxysmal atrial fibrillation.  He has a CHADS-VASc score of 1 we have discussed potential thromboembolic risks, we have discussed the variable impact of different thromboembolic risk points noting that agent is the most potent predictor of risk.  We discussed the potential contributing value of left atrial structural changes.  His PVC burden is variable; he has no attributable symptoms.  We will plan to get an echocardiogram but he would like to do it in one years time.  We discussed the role of anticoagulation for patients with a  CHADS-VASc score of 1 at this juncture he is not interested;.  We will review it again in a year.     Virl Axe

## 2018-03-04 ENCOUNTER — Telehealth: Payer: Self-pay | Admitting: Internal Medicine

## 2018-03-04 DIAGNOSIS — I480 Paroxysmal atrial fibrillation: Secondary | ICD-10-CM

## 2018-03-04 DIAGNOSIS — I493 Ventricular premature depolarization: Secondary | ICD-10-CM

## 2018-03-04 NOTE — Telephone Encounter (Signed)
Spoke with the pt.  Order for echo placed, as advised and indicated in Dr Olin Pia last [place] note with the pt.  Pts echo is scheduled for 03/08/18 at 0730.  Pt verbalized understanding and agrees with this plan. Pt gracious for all the assistance provided.

## 2018-03-04 NOTE — Telephone Encounter (Signed)
° °  Patient called to request an echo based on AVS from last ov. Patient states he is not waiting until April Please enter order for echo

## 2018-03-04 NOTE — Telephone Encounter (Signed)
  The patient has paroxysmal atrial fibrillation.  He has a CHADS-VASc score of 1 we have discussed potential thromboembolic risks, we have discussed the variable impact of different thromboembolic risk points noting that agent is the most potent predictor of risk.  We discussed the potential contributing value of left atrial structural changes.  His PVC burden is variable; he has no attributable symptoms.  We will plan to get an echocardiogram but he would like to do it in one years time.  We discussed the role of anticoagulation for patients with a CHADS-VASc score of 1 at this juncture he is not interested;.  We will review it again in a year.     Kevin Griffith

## 2018-03-08 ENCOUNTER — Ambulatory Visit (HOSPITAL_COMMUNITY): Payer: 59 | Attending: Cardiovascular Disease

## 2018-03-08 ENCOUNTER — Other Ambulatory Visit: Payer: Self-pay

## 2018-03-08 DIAGNOSIS — I480 Paroxysmal atrial fibrillation: Secondary | ICD-10-CM | POA: Insufficient documentation

## 2018-03-08 DIAGNOSIS — I493 Ventricular premature depolarization: Secondary | ICD-10-CM | POA: Insufficient documentation

## 2018-03-10 ENCOUNTER — Telehealth: Payer: Self-pay

## 2018-03-10 NOTE — Telephone Encounter (Signed)
Pt advised his echo results

## 2018-03-16 ENCOUNTER — Other Ambulatory Visit: Payer: Self-pay | Admitting: Orthopedic Surgery

## 2018-03-16 DIAGNOSIS — M545 Low back pain: Principal | ICD-10-CM

## 2018-03-16 DIAGNOSIS — M25552 Pain in left hip: Secondary | ICD-10-CM | POA: Diagnosis not present

## 2018-03-16 DIAGNOSIS — G8929 Other chronic pain: Secondary | ICD-10-CM

## 2018-03-24 ENCOUNTER — Ambulatory Visit
Admission: RE | Admit: 2018-03-24 | Discharge: 2018-03-24 | Disposition: A | Discharge status: Home / Self Care | Payer: 59 | Source: Ambulatory Visit | Attending: Orthopedic Surgery | Admitting: Orthopedic Surgery

## 2018-03-24 DIAGNOSIS — G8929 Other chronic pain: Secondary | ICD-10-CM

## 2018-03-24 DIAGNOSIS — M545 Low back pain, unspecified: Secondary | ICD-10-CM

## 2018-03-24 MED ORDER — DIAZEPAM 5 MG PO TABS
5.0000 mg | ORAL_TABLET | Freq: Once | ORAL | Status: AC
  Administered 2018-03-24: 5 mg via ORAL

## 2018-03-24 MED ORDER — IOPAMIDOL (ISOVUE-M 200) INJECTION 41%
15.0000 mL | Freq: Once | INTRAMUSCULAR | Status: AC
  Administered 2018-03-24: 15 mL via EPIDURAL

## 2018-03-24 NOTE — Discharge Instructions (Signed)

## 2018-04-08 DIAGNOSIS — M5136 Other intervertebral disc degeneration, lumbar region: Secondary | ICD-10-CM | POA: Diagnosis not present

## 2018-04-14 DIAGNOSIS — Z6830 Body mass index (BMI) 30.0-30.9, adult: Secondary | ICD-10-CM | POA: Diagnosis not present

## 2018-04-14 DIAGNOSIS — R030 Elevated blood-pressure reading, without diagnosis of hypertension: Secondary | ICD-10-CM | POA: Diagnosis not present

## 2018-04-14 DIAGNOSIS — M5416 Radiculopathy, lumbar region: Secondary | ICD-10-CM | POA: Diagnosis not present

## 2018-05-14 ENCOUNTER — Ambulatory Visit (INDEPENDENT_AMBULATORY_CARE_PROVIDER_SITE_OTHER): Payer: 59 | Admitting: Internal Medicine

## 2018-05-14 ENCOUNTER — Encounter: Payer: Self-pay | Admitting: Internal Medicine

## 2018-05-14 VITALS — BP 161/94 | HR 75 | Ht 72.0 in | Wt 230.0 lb

## 2018-05-14 DIAGNOSIS — M5416 Radiculopathy, lumbar region: Secondary | ICD-10-CM | POA: Diagnosis not present

## 2018-05-14 DIAGNOSIS — I493 Ventricular premature depolarization: Secondary | ICD-10-CM | POA: Diagnosis not present

## 2018-05-14 DIAGNOSIS — I480 Paroxysmal atrial fibrillation: Secondary | ICD-10-CM | POA: Diagnosis not present

## 2018-05-14 NOTE — Progress Notes (Signed)
      Patient Care Team: Crist Infante, MD as PCP - General (Internal Medicine)   HPI  Kevin Griffith is a 68 y.o. male Seen in follow-up for infrequent palpitations.  Is been noted to have PVCs.  Also carries a diagnosis of a atrial fibrillation-paroxysmal.  Previous discussions regarding anticoagulation he was not interested.  Records and Results Reviewed   Past Medical History:  Diagnosis Date  . Asymptomatic microscopic hematuria   . Back pain, chronic   . Cervical stenosis of spine   . Colon polyps   . DDD (degenerative disc disease), cervical   . DDD (degenerative disc disease), lumbar   . Drug rash   . ED (erectile dysfunction)   . Hyperbilirubinemia   . Hypertriglyceridemia   . IFG (impaired fasting glucose)   . Insomnia disorder   . Lactose intolerance   . Mood change   . MRSA (methicillin resistant Staphylococcus aureus)   . Palpitations   . Paroxysmal A-fib ([place])   . Skin problem   . Sleep apnea, obstructive   . Vitamin D deficiency     Past Surgical History:  Procedure Laterality Date  . APPENDECTOMY  1970  . [place], 07-2001  . HEMIDISCECECTOMY      Current Meds  Medication Sig  . Ascorbic Acid (VITAMIN C PO) Take 3,000 mg by mouth daily.  . B Complex Vitamins (VITAMIN-B COMPLEX PO) Take 1 tablet by mouth daily.  . Cholecalciferol (VITAMIN D3) 10000 units capsule Take 10,000 Units by mouth daily.  Marland Kitchen GELATIN PO Take 1 tablet by mouth daily.  Marland Kitchen ibuprofen (ADVIL,MOTRIN) 200 MG tablet Take 200 mg by mouth every 6 (six) hours as needed.  . Levomefolate Glucosamine (METHYLFOLATE PO) Take 400 mcg by mouth daily.  . Menaquinone-7 (VITAMIN K2 PO) Take 500 mcg by mouth daily.  . Methylsulfonylmethane (MSM PO) Take 3000 mg by mouth daily  . Multiple Minerals (MINERAL COMPLEX PO) Take 1 tablet by mouth daily.  Marland Kitchen VITAMIN A PO Take 4,000 Units by mouth daily.  . vitamin B-12 (CYANOCOBALAMIN) 1000 MCG tablet Take 1,000 mcg by mouth daily.  . vitamin  E 400 UNIT capsule Take 400 Units by mouth daily.   Current Facility-Administered Medications for the 05/14/18 encounter (Office Visit) with Deboraha Sprang, MD  Medication  . 0.9 %  sodium chloride infusion    Allergies  Allergen Reactions  . Acetaminophen Other (See Comments)    Increases blood pressure   . Sulfa Antibiotics Rash      Review of Systems negative except from HPI and PMH  Physical Exam BP (!) 161/94   Pulse 75   Ht 6' (1.829 m)   Wt 230 lb (104.3 kg)   SpO2 96%   BMI 31.19 kg/m  Well developed and nourished in no acute distress HENT normal Neck supple with JVP-flat Clear Regular rate and rhythm, no murmurs or gallops Abd-soft with active BS No Clubbing cyanosis edema Skin-warm and dry A & Oriented  Grossly normal sensory and motor function   ECG sinus at 75 Intervals 18/09/37 Voltage criteria for LVH Isolated PVC left bundle inferior axis  Assessment and  Plan  Atrial fibrillation-paroxysmal  PVCs-RVOT  Elevated blood pressure No significant arrhythmias    Current medicines are reviewed at length with the patient today .  The patient does not  have concerns regarding medicines.

## 2018-05-14 NOTE — Patient Instructions (Signed)
Medication Instructions:  Your physician recommends that you continue on your current medications as directed. Please refer to the Current Medication list given to you today.  Labwork: None ordered.  Testing/Procedures: None ordered.  Follow-Up: Your physician recommends that you schedule a follow-up appointment in:   As Needed with Dr. Klein  Any Other Special Instructions Will Be Listed Below (If Applicable).     If you need a refill on your cardiac medications before your next appointment, please call your pharmacy.  

## 2018-05-25 DIAGNOSIS — M545 Low back pain: Secondary | ICD-10-CM | POA: Diagnosis not present

## 2018-05-25 DIAGNOSIS — M5416 Radiculopathy, lumbar region: Secondary | ICD-10-CM | POA: Diagnosis not present

## 2018-05-26 DIAGNOSIS — M5416 Radiculopathy, lumbar region: Secondary | ICD-10-CM | POA: Diagnosis not present

## 2018-05-26 DIAGNOSIS — Z6831 Body mass index (BMI) 31.0-31.9, adult: Secondary | ICD-10-CM | POA: Diagnosis not present

## 2018-05-26 DIAGNOSIS — R030 Elevated blood-pressure reading, without diagnosis of hypertension: Secondary | ICD-10-CM | POA: Diagnosis not present

## 2018-07-06 DIAGNOSIS — R030 Elevated blood-pressure reading, without diagnosis of hypertension: Secondary | ICD-10-CM | POA: Diagnosis not present

## 2018-07-06 DIAGNOSIS — Z6832 Body mass index (BMI) 32.0-32.9, adult: Secondary | ICD-10-CM | POA: Diagnosis not present

## 2018-07-06 DIAGNOSIS — M5126 Other intervertebral disc displacement, lumbar region: Secondary | ICD-10-CM | POA: Diagnosis not present

## 2018-07-07 ENCOUNTER — Telehealth: Payer: Self-pay | Admitting: *Deleted

## 2018-07-07 ENCOUNTER — Other Ambulatory Visit: Payer: Self-pay | Admitting: Neurological Surgery

## 2018-07-07 NOTE — Telephone Encounter (Signed)
   [facility] Pre-operative Risk Assessment    Request for surgical clearance:  1. What type of surgery is being performed? L4-5 LUMBAR FUSION   2. When is this surgery scheduled? 08/02/18   3. What type of clearance is required (medical clearance vs. Pharmacy clearance to hold med vs. Both)? MEDICAL  4. Are there any medications that need to be held prior to surgery and how long?NO ANTICOAGS LISTED ON MED LIST   5. Practice name and name of physician performing surgery? [facility]; DR. CRAM   6. What is your office phone number [phone]    7.   What is your office fax number [phone]  8.   Anesthesia type (None, local, MAC, general) ? GENERAL   Julaine Hua 07/07/2018, 11:12 AM  _________________________________________________________________   (provider comments below)

## 2018-07-07 NOTE — Telephone Encounter (Signed)
   Primary Cardiologist: Dr. Caryl Comes Electrophysiology   Chart reviewed as part of pre-operative protocol coverage. Patient was contacted 07/07/2018 in reference to pre-operative risk assessment for pending surgery as outlined below.  Kevin Griffith was last seen on 05/14/18 by Dr. Caryl Comes.   Since that day, Kevin Griffith has done well with no CAD.  He has no chest pain or SOb.  He is followed as needed for PAF that occurred after/during surgery.  He has none recently and is on no anticoagulation.   Therefore, based on ACC/AHA guidelines, the patient would be at acceptable risk for the planned procedure without further cardiovascular testing.   I will route this recommendation to the requesting party via Epic fax function and remove from pre-op pool.  Please call with questions.  Cecilie Kicks, NP 07/07/2018, 12:16 PM

## 2018-07-28 NOTE — Pre-Procedure Instructions (Signed)
Kevin Griffith  07/28/2018        Your procedure is scheduled on Monday 08-09-18 from 0730 -1028am  Report to [facility], entrance A at [email or web address].  Call this number if you have problems the morning of surgery:  [phone]   Remember:  Do not eat or drink after midnight.     Take these medicines as needed  the morning of surgery with A SIP OF WATER :             Tramadol (Ultram)  7 days prior to surgery (08-02-18)STOP taking any Aspirin (unless otherwise instructed by your surgeon), Aleve, Naproxen, Ibuprofen, Motrin, Advil, Goody's, BC's, all herbal medications, fish oil, and all vitamins.     Do not wear jewelry, make-up or nail polish.  Do not wear lotions, powders, or cologne, or deodorant.  Do not shave 48 hours prior to surgery.  Men may shave face and neck.  Do not bring valuables to the hospital.  [facility] is not responsible for any belongings or valuables.  Contacts, dentures or bridgework may not be worn into surgery.  Leave your suitcase in the car.  After surgery it may be brought to your room.  For patients admitted to the hospital, discharge time will be determined by your treatment team.  Patients discharged the day of surgery will not be allowed to drive home.   Special instructions: [place]- Preparing For Surgery  Before surgery, you can play an important role. Because skin is not sterile, your skin needs to be as free of germs as possible. You can reduce the number of germs on your skin by washing with CHG (chlorahexidine gluconate) Soap before surgery.  CHG is an antiseptic cleaner which kills germs and bonds with the skin to continue killing germs even after washing.    Oral Hygiene is also important to reduce your risk of infection.  Remember - BRUSH YOUR TEETH THE MORNING OF SURGERY WITH YOUR REGULAR TOOTHPASTE  Please do not use if you have an allergy to CHG or antibacterial soaps. If your skin becomes reddened/irritated stop using the  CHG.  Do not shave (including legs and underarms) for at least 48 hours prior to first CHG shower. It is OK to shave your face.  Please follow these instructions carefully.   1. Shower the NIGHT BEFORE SURGERY and the MORNING OF SURGERY with CHG.   2. If you chose to wash your hair, wash your hair first as usual with your normal shampoo.  3. After you shampoo, rinse your hair and body thoroughly to remove the shampoo.  4. Use CHG as you would any other liquid soap. You can apply CHG directly to the skin and wash gently with a scrungie or a clean washcloth.   5. Apply the CHG Soap to your body ONLY FROM THE NECK DOWN.  Do not use on open wounds or open sores. Avoid contact with your eyes, ears, mouth and genitals (private parts). Wash Face and genitals (private parts)  with your normal soap.  6. Wash thoroughly, paying special attention to the area where your surgery will be performed.  7. Thoroughly rinse your body with warm water from the neck down.  8. DO NOT shower/wash with your normal soap after using and rinsing off the CHG Soap.  9. Pat yourself dry with a CLEAN TOWEL.  10. Wear CLEAN PAJAMAS to bed the night before surgery, wear comfortable clothes the morning of surgery  11. Place  CLEAN SHEETS on your bed the night of your first shower and DO NOT SLEEP WITH PETS.  Day of Surgery:  Do not apply any deodorants/lotions.  Please wear clean clothes to the hospital/surgery center.   Remember to brush your teeth WITH YOUR REGULAR TOOTHPASTE.  Please read over the following fact sheets that you were given. Pain Booklet, Coughing and Deep Breathing, MRSA Information and Surgical Site Infection Prevention

## 2018-07-30 ENCOUNTER — Ambulatory Visit (HOSPITAL_COMMUNITY): Admission: RE | Admit: 2018-07-30 | Payer: PPO | Source: Ambulatory Visit

## 2018-07-30 ENCOUNTER — Ambulatory Visit (HOSPITAL_COMMUNITY)
Admission: RE | Admit: 2018-07-30 | Discharge: 2018-07-30 | Disposition: A | Discharge status: Home / Self Care | Payer: PPO | Source: Ambulatory Visit | Attending: Neurological Surgery | Admitting: Neurological Surgery

## 2018-07-30 ENCOUNTER — Encounter (HOSPITAL_COMMUNITY)
Admission: RE | Admit: 2018-07-30 | Discharge: 2018-07-30 | Disposition: A | Discharge status: Home / Self Care | Payer: PPO | Source: Ambulatory Visit | Attending: Neurological Surgery | Admitting: Neurological Surgery

## 2018-07-30 ENCOUNTER — Encounter (HOSPITAL_COMMUNITY): Payer: Self-pay

## 2018-07-30 ENCOUNTER — Other Ambulatory Visit: Payer: Self-pay

## 2018-07-30 DIAGNOSIS — N183 Chronic kidney disease, stage 3 unspecified: Secondary | ICD-10-CM | POA: Diagnosis not present

## 2018-07-30 DIAGNOSIS — F17200 Nicotine dependence, unspecified, uncomplicated: Secondary | ICD-10-CM | POA: Diagnosis not present

## 2018-07-30 DIAGNOSIS — Z23 Encounter for immunization: Secondary | ICD-10-CM | POA: Diagnosis not present

## 2018-07-30 DIAGNOSIS — R0602 Shortness of breath: Secondary | ICD-10-CM | POA: Diagnosis not present

## 2018-07-30 DIAGNOSIS — K640 First degree hemorrhoids: Secondary | ICD-10-CM | POA: Diagnosis not present

## 2018-07-30 DIAGNOSIS — D125 Benign neoplasm of sigmoid colon: Secondary | ICD-10-CM | POA: Diagnosis not present

## 2018-07-30 DIAGNOSIS — K5730 Diverticulosis of large intestine without perforation or abscess without bleeding: Secondary | ICD-10-CM | POA: Diagnosis not present

## 2018-07-30 DIAGNOSIS — M5126 Other intervertebral disc displacement, lumbar region: Secondary | ICD-10-CM | POA: Diagnosis not present

## 2018-07-30 DIAGNOSIS — Z01818 Encounter for other preprocedural examination: Secondary | ICD-10-CM | POA: Diagnosis not present

## 2018-07-30 DIAGNOSIS — G9349 Other encephalopathy: Secondary | ICD-10-CM | POA: Diagnosis not present

## 2018-07-30 DIAGNOSIS — E876 Hypokalemia: Secondary | ICD-10-CM | POA: Diagnosis not present

## 2018-07-30 DIAGNOSIS — K589 Irritable bowel syndrome without diarrhea: Secondary | ICD-10-CM | POA: Diagnosis not present

## 2018-07-30 DIAGNOSIS — D649 Anemia, unspecified: Secondary | ICD-10-CM | POA: Diagnosis not present

## 2018-07-30 DIAGNOSIS — Z86010 Personal history of colonic polyps: Secondary | ICD-10-CM | POA: Diagnosis not present

## 2018-07-30 DIAGNOSIS — D128 Benign neoplasm of rectum: Secondary | ICD-10-CM | POA: Diagnosis not present

## 2018-07-30 DIAGNOSIS — E782 Mixed hyperlipidemia: Secondary | ICD-10-CM | POA: Diagnosis not present

## 2018-07-30 LAB — BASIC METABOLIC PANEL
Anion gap: 7 (ref 5–15)
BUN: 15 mg/dL (ref 8–23)
CO2: 25 mmol/L (ref 22–32)
Calcium: 9 mg/dL (ref 8.9–10.3)
Chloride: 107 mmol/L (ref 98–111)
Creatinine, Ser: 1.2 mg/dL (ref 0.61–1.24)
GFR calc Af Amer: >60 mL/min (ref >60)
GFR calc non Af Amer: >60 mL/min (ref >60)
Glucose, Bld: 106 mg/dL — ABNORMAL HIGH (ref 70–99)
Potassium: 4.1 mmol/L (ref 3.5–5.1)
Sodium: 139 mmol/L (ref 135–145)

## 2018-07-30 LAB — PROTIME-INR
INR: 1.1 (ref 0.8–1.2)
Prothrombin Time: 14 seconds (ref 11.4–15.2)

## 2018-07-30 LAB — TYPE AND SCREEN
ABO/RH(D): A POS
Antibody Screen: NEGATIVE

## 2018-07-30 LAB — CBC WITH DIFFERENTIAL/PLATELET
Abs Immature Granulocytes: 0.06 10*3/uL (ref 0.00–0.07)
Basophils Absolute: 0.1 10*3/uL (ref 0.0–0.1)
Basophils Relative: 1 %
Eosinophils Absolute: 0.2 10*3/uL (ref 0.0–0.5)
Eosinophils Relative: 2 %
HCT: 46.1 % (ref 39.0–52.0)
Hemoglobin: 15.5 g/dL (ref 13.0–17.0)
Immature Granulocytes: 1 %
Lymphocytes Relative: 24 %
Lymphs Abs: 2.1 10*3/uL (ref 0.7–4.0)
MCH: 30.9 pg (ref 26.0–34.0)
MCHC: 33.6 g/dL (ref 30.0–36.0)
MCV: 91.8 fL (ref 80.0–100.0)
Monocytes Absolute: 0.9 10*3/uL (ref 0.1–1.0)
Monocytes Relative: 10 %
Neutro Abs: 5.5 10*3/uL (ref 1.7–7.7)
Neutrophils Relative %: 62 %
Platelets: 210 10*3/uL (ref 150–400)
RBC: 5.02 MIL/uL (ref 4.22–5.81)
RDW: 12 % (ref 11.5–15.5)
WBC: 8.8 10*3/uL (ref 4.0–10.5)
nRBC: 0 % (ref 0.0–0.2)

## 2018-07-30 LAB — ABO/RH: ABO/RH(D): A POS

## 2018-07-30 LAB — SURGICAL PCR SCREEN
MRSA, PCR: NEGATIVE
Staphylococcus aureus: NEGATIVE

## 2018-07-30 NOTE — Progress Notes (Signed)
PCP - Dr. Crist Infante Cardiologist - Dr. Virl Axe  Chest x-ray - 07/30/2018 EKG - 07/30/2018 Stress Test - denies ECHO - 03/08/18 Cardiac Cath - denies  Sleep Study -  CPAP - No. Per pt. No longer uses CPAP since 2007  Blood Thinner Instructions: N/A Aspirin Instructions: NA  Anesthesia review: No  Coronavirus Screening  Have you experienced the following symptoms:  Cough yes/no: No Fever (>100.67F)  yes/no: No Runny nose yes/no: No Sore throat yes/no: No Difficulty breathing/shortness of breath  yes/no: No  Have you or a family member traveled in the last 14 days and where? yes/no: No  If the patient indicates "YES" to the above questions, their PAT will be rescheduled to limit the exposure to others and, the surgeon will be notified. THE PATIENT WILL NEED TO BE ASYMPTOMATIC FOR 14 DAYS.   If the patient is not experiencing any of these symptoms, the PAT nurse will instruct them to NOT bring anyone with them to their appointment since they may have these symptoms or traveled as well.   Please remind your patients and families that hospital visitation restrictions are in effect and the importance of the restrictions.   Patient denies shortness of breath, fever, cough and chest pain at PAT appointment  Patient verbalized understanding of instructions that were given to them at the PAT appointment. Patient was also instructed that they will need to review over the PAT instructions again at home before surgery.

## 2018-08-06 NOTE — Progress Notes (Signed)

## 2018-08-09 ENCOUNTER — Encounter (HOSPITAL_COMMUNITY)
Admission: RE | Disposition: A | Discharge status: Home / Self Care | Payer: Self-pay | Source: Home / Self Care | Attending: Neurological Surgery

## 2018-08-09 ENCOUNTER — Inpatient Hospital Stay (HOSPITAL_COMMUNITY): Payer: PPO | Admitting: Vascular Surgery

## 2018-08-09 ENCOUNTER — Inpatient Hospital Stay (HOSPITAL_COMMUNITY): Payer: PPO | Admitting: Certified Registered"

## 2018-08-09 ENCOUNTER — Other Ambulatory Visit: Payer: Self-pay

## 2018-08-09 ENCOUNTER — Inpatient Hospital Stay (HOSPITAL_COMMUNITY): Payer: PPO

## 2018-08-09 ENCOUNTER — Encounter (HOSPITAL_COMMUNITY): Payer: Self-pay | Admitting: *Deleted

## 2018-08-09 ENCOUNTER — Inpatient Hospital Stay (HOSPITAL_COMMUNITY)
Admission: RE | Admit: 2018-08-09 | Discharge: 2018-08-10 | DRG: 455 | Disposition: A | Discharge status: Home / Self Care | Payer: PPO | Attending: Neurological Surgery | Admitting: Neurological Surgery

## 2018-08-09 DIAGNOSIS — I480 Paroxysmal atrial fibrillation: Secondary | ICD-10-CM | POA: Diagnosis not present

## 2018-08-09 DIAGNOSIS — G8929 Other chronic pain: Secondary | ICD-10-CM | POA: Diagnosis present

## 2018-08-09 DIAGNOSIS — Z886 Allergy status to analgesic agent status: Secondary | ICD-10-CM | POA: Diagnosis not present

## 2018-08-09 DIAGNOSIS — Z833 Family history of diabetes mellitus: Secondary | ICD-10-CM | POA: Diagnosis not present

## 2018-08-09 DIAGNOSIS — Z8249 Family history of ischemic heart disease and other diseases of the circulatory system: Secondary | ICD-10-CM | POA: Diagnosis not present

## 2018-08-09 DIAGNOSIS — M5126 Other intervertebral disc displacement, lumbar region: Principal | ICD-10-CM | POA: Diagnosis present

## 2018-08-09 DIAGNOSIS — Z79891 Long term (current) use of opiate analgesic: Secondary | ICD-10-CM | POA: Diagnosis not present

## 2018-08-09 DIAGNOSIS — M48061 Spinal stenosis, lumbar region without neurogenic claudication: Secondary | ICD-10-CM | POA: Diagnosis present

## 2018-08-09 DIAGNOSIS — G4730 Sleep apnea, unspecified: Secondary | ICD-10-CM | POA: Diagnosis present

## 2018-08-09 DIAGNOSIS — M4802 Spinal stenosis, cervical region: Secondary | ICD-10-CM | POA: Diagnosis not present

## 2018-08-09 DIAGNOSIS — Z882 Allergy status to sulfonamides status: Secondary | ICD-10-CM

## 2018-08-09 DIAGNOSIS — M67911 Unspecified disorder of synovium and tendon, right shoulder: Secondary | ICD-10-CM | POA: Diagnosis not present

## 2018-08-09 DIAGNOSIS — Z419 Encounter for procedure for purposes other than remedying health state, unspecified: Secondary | ICD-10-CM

## 2018-08-09 DIAGNOSIS — Z8719 Personal history of other diseases of the digestive system: Secondary | ICD-10-CM

## 2018-08-09 DIAGNOSIS — M5136 Other intervertebral disc degeneration, lumbar region: Secondary | ICD-10-CM | POA: Diagnosis not present

## 2018-08-09 DIAGNOSIS — M4326 Fusion of spine, lumbar region: Secondary | ICD-10-CM | POA: Diagnosis not present

## 2018-08-09 DIAGNOSIS — Z79899 Other long term (current) drug therapy: Secondary | ICD-10-CM | POA: Diagnosis not present

## 2018-08-09 SURGERY — POSTERIOR LUMBAR FUSION 1 LEVEL
Anesthesia: General | Site: Back

## 2018-08-09 MED ORDER — LIDOCAINE 2% (20 MG/ML) 5 ML SYRINGE
INTRAMUSCULAR | Status: AC
  Filled 2018-08-09: qty 5

## 2018-08-09 MED ORDER — B COMPLEX-C PO TABS: 1.0000 | ORAL_TABLET | Freq: Every day | ORAL | Status: DC

## 2018-08-09 MED ORDER — VITAMIN D 25 MCG (1000 UNIT) PO TABS: 10000.0000 [IU] | ORAL_TABLET | Freq: Every day | ORAL | Status: DC

## 2018-08-09 MED ORDER — LIDOCAINE 2% (20 MG/ML) 5 ML SYRINGE
INTRAMUSCULAR | Status: DC | PRN
  Administered 2018-08-09: 100 mg via INTRAVENOUS

## 2018-08-09 MED ORDER — PROPOFOL 10 MG/ML IV BOLUS
INTRAVENOUS | Status: DC | PRN
  Administered 2018-08-09: 200 mg via INTRAVENOUS

## 2018-08-09 MED ORDER — VITAMIN C 500 MG PO TABS
3000.0000 mg | ORAL_TABLET | Freq: Every day | ORAL | Status: DC
  Administered 2018-08-10: 3000 mg via ORAL
  Filled 2018-08-09: qty 6

## 2018-08-09 MED ORDER — THROMBIN 20000 UNITS EX SOLR
CUTANEOUS | Status: DC | PRN
  Administered 2018-08-09: 15:00:00

## 2018-08-09 MED ORDER — DEXAMETHASONE SODIUM PHOSPHATE 4 MG/ML IJ SOLN
4.0000 mg | Freq: Four times a day (QID) | INTRAMUSCULAR | Status: DC
  Administered 2018-08-09: 4 mg via INTRAVENOUS
  Filled 2018-08-09: qty 1

## 2018-08-09 MED ORDER — THROMBIN 20000 UNITS EX SOLR
CUTANEOUS | Status: AC
  Filled 2018-08-09: qty 20000

## 2018-08-09 MED ORDER — FENTANYL CITRATE (PF) 250 MCG/5ML IJ SOLN
INTRAMUSCULAR | Status: AC
  Filled 2018-08-09: qty 5

## 2018-08-09 MED ORDER — HYDROMORPHONE HCL 1 MG/ML IJ SOLN
0.5000 mg | INTRAMUSCULAR | Status: DC | PRN
  Administered 2018-08-10: 0.5 mg via INTRAVENOUS
  Filled 2018-08-09 (×2): qty 1

## 2018-08-09 MED ORDER — ACETAMINOPHEN 325 MG PO TABS: 650.0000 mg | ORAL_TABLET | ORAL | Status: DC | PRN

## 2018-08-09 MED ORDER — FENTANYL CITRATE (PF) 250 MCG/5ML IJ SOLN
INTRAMUSCULAR | Status: DC | PRN
  Administered 2018-08-09: 100 ug via INTRAVENOUS
  Administered 2018-08-09 (×3): 50 ug via INTRAVENOUS

## 2018-08-09 MED ORDER — BUPIVACAINE HCL (PF) 0.25 % IJ SOLN
INTRAMUSCULAR | Status: DC | PRN
  Administered 2018-08-09: 20 mL

## 2018-08-09 MED ORDER — MIDAZOLAM HCL 2 MG/2ML IJ SOLN
INTRAMUSCULAR | Status: AC
  Filled 2018-08-09: qty 2

## 2018-08-09 MED ORDER — SODIUM CHLORIDE 0.9 % IV SOLN
INTRAVENOUS | Status: DC | PRN
  Administered 2018-08-09: 15:00:00

## 2018-08-09 MED ORDER — KETAMINE HCL 50 MG/5ML IJ SOSY
PREFILLED_SYRINGE | INTRAMUSCULAR | Status: AC
  Filled 2018-08-09: qty 5

## 2018-08-09 MED ORDER — OXYCODONE HCL 5 MG PO TABS
ORAL_TABLET | ORAL | Status: AC
  Administered 2018-08-09: 5 mg via ORAL
  Filled 2018-08-09: qty 1

## 2018-08-09 MED ORDER — THROMBIN 5000 UNITS EX SOLR
CUTANEOUS | Status: AC
  Filled 2018-08-09: qty 5000

## 2018-08-09 MED ORDER — SODIUM CHLORIDE 0.9% FLUSH: 3.0000 mL | INTRAVENOUS | Status: DC | PRN

## 2018-08-09 MED ORDER — SODIUM CHLORIDE 0.9% FLUSH
3.0000 mL | Freq: Two times a day (BID) | INTRAVENOUS | Status: DC
  Administered 2018-08-09: 3 mL via INTRAVENOUS

## 2018-08-09 MED ORDER — METHOCARBAMOL 1000 MG/10ML IJ SOLN
500.0000 mg | Freq: Four times a day (QID) | INTRAVENOUS | Status: DC | PRN
  Administered 2018-08-09: 500 mg via INTRAVENOUS
  Filled 2018-08-09 (×2): qty 5

## 2018-08-09 MED ORDER — CHLORHEXIDINE GLUCONATE CLOTH 2 % EX PADS: 6.0000 | MEDICATED_PAD | Freq: Once | CUTANEOUS | Status: DC

## 2018-08-09 MED ORDER — POTASSIUM CHLORIDE IN NACL 20-0.9 MEQ/L-% IV SOLN
INTRAVENOUS | Status: DC
  Administered 2018-08-09: 21:00:00 via INTRAVENOUS
  Filled 2018-08-09: qty 1000

## 2018-08-09 MED ORDER — CEFAZOLIN SODIUM-DEXTROSE 2-4 GM/100ML-% IV SOLN
2.0000 g | INTRAVENOUS | Status: AC
  Administered 2018-08-09: 2 g via INTRAVENOUS

## 2018-08-09 MED ORDER — METHOCARBAMOL 500 MG PO TABS
500.0000 mg | ORAL_TABLET | Freq: Four times a day (QID) | ORAL | Status: DC | PRN
  Administered 2018-08-09: 500 mg via ORAL

## 2018-08-09 MED ORDER — OXYCODONE HCL 5 MG PO TABS
5.0000 mg | ORAL_TABLET | ORAL | Status: DC | PRN
  Administered 2018-08-09 – 2018-08-10 (×5): 5 mg via ORAL
  Filled 2018-08-09 (×4): qty 1

## 2018-08-09 MED ORDER — DEXAMETHASONE SODIUM PHOSPHATE 10 MG/ML IJ SOLN
INTRAMUSCULAR | Status: DC | PRN
  Administered 2018-08-09: 10 mg via INTRAVENOUS

## 2018-08-09 MED ORDER — SENNA 8.6 MG PO TABS
1.0000 | ORAL_TABLET | Freq: Two times a day (BID) | ORAL | Status: DC
  Administered 2018-08-10: 8.6 mg via ORAL
  Filled 2018-08-09: qty 1

## 2018-08-09 MED ORDER — LACTATED RINGERS IV SOLN
INTRAVENOUS | Status: DC | PRN
  Administered 2018-08-09 (×2): via INTRAVENOUS

## 2018-08-09 MED ORDER — MORPHINE SULFATE (PF) 2 MG/ML IV SOLN
INTRAVENOUS | Status: AC
  Administered 2018-08-09: 2 mg via INTRAMUSCULAR
  Filled 2018-08-09: qty 1

## 2018-08-09 MED ORDER — HYDROMORPHONE HCL 1 MG/ML IJ SOLN
INTRAMUSCULAR | Status: DC | PRN
  Administered 2018-08-09: 0.5 mg via INTRAVENOUS

## 2018-08-09 MED ORDER — HYDROMORPHONE HCL 1 MG/ML IJ SOLN
INTRAMUSCULAR | Status: AC
  Filled 2018-08-09: qty 0.5

## 2018-08-09 MED ORDER — VANCOMYCIN HCL 1000 MG IV SOLR
INTRAVENOUS | Status: AC
  Filled 2018-08-09: qty 1000

## 2018-08-09 MED ORDER — LEVOMEFOLATE GLUCOSAMINE 400 MCG PO CAPS: 400.0000 ug | ORAL_CAPSULE | Freq: Every day | ORAL | Status: DC

## 2018-08-09 MED ORDER — VITAMIN B-12 1000 MCG PO TABS
1000.0000 ug | ORAL_TABLET | Freq: Every day | ORAL | Status: DC
  Administered 2018-08-10: 1000 ug via ORAL
  Filled 2018-08-09: qty 1

## 2018-08-09 MED ORDER — METHOCARBAMOL 500 MG PO TABS
ORAL_TABLET | ORAL | Status: AC
  Administered 2018-08-09: 500 mg via ORAL
  Filled 2018-08-09: qty 1

## 2018-08-09 MED ORDER — PROPOFOL 10 MG/ML IV BOLUS
INTRAVENOUS | Status: AC
  Filled 2018-08-09: qty 20

## 2018-08-09 MED ORDER — FENTANYL CITRATE (PF) 100 MCG/2ML IJ SOLN
25.0000 ug | INTRAMUSCULAR | Status: DC | PRN
  Administered 2018-08-09: 50 ug via INTRAVENOUS

## 2018-08-09 MED ORDER — ONDANSETRON HCL 4 MG/2ML IJ SOLN
INTRAMUSCULAR | Status: AC
  Filled 2018-08-09: qty 2

## 2018-08-09 MED ORDER — ONDANSETRON HCL 4 MG/2ML IJ SOLN
INTRAMUSCULAR | Status: DC | PRN
  Administered 2018-08-09: 4 mg via INTRAVENOUS

## 2018-08-09 MED ORDER — PROPOFOL 1000 MG/100ML IV EMUL
INTRAVENOUS | Status: AC
  Filled 2018-08-09: qty 100

## 2018-08-09 MED ORDER — SODIUM CHLORIDE 0.9 % IV SOLN: 250.0000 mL | INTRAVENOUS | Status: DC

## 2018-08-09 MED ORDER — ROCURONIUM BROMIDE 10 MG/ML (PF) SYRINGE
PREFILLED_SYRINGE | INTRAVENOUS | Status: DC | PRN
  Administered 2018-08-09: 50 mg via INTRAVENOUS
  Administered 2018-08-09 (×2): 25 mg via INTRAVENOUS
  Administered 2018-08-09: 20 mg via INTRAVENOUS

## 2018-08-09 MED ORDER — ROCURONIUM BROMIDE 50 MG/5ML IV SOSY
PREFILLED_SYRINGE | INTRAVENOUS | Status: AC
  Filled 2018-08-09: qty 10

## 2018-08-09 MED ORDER — SUCCINYLCHOLINE CHLORIDE 200 MG/10ML IV SOSY
PREFILLED_SYRINGE | INTRAVENOUS | Status: AC
  Filled 2018-08-09: qty 10

## 2018-08-09 MED ORDER — FENTANYL CITRATE (PF) 100 MCG/2ML IJ SOLN
INTRAMUSCULAR | Status: AC
  Administered 2018-08-09: 50 ug via INTRAVENOUS
  Filled 2018-08-09: qty 2

## 2018-08-09 MED ORDER — ACETAMINOPHEN 650 MG RE SUPP: 650.0000 mg | RECTAL | Status: DC | PRN

## 2018-08-09 MED ORDER — ONDANSETRON HCL 4 MG PO TABS: 4.0000 mg | ORAL_TABLET | Freq: Four times a day (QID) | ORAL | Status: DC | PRN

## 2018-08-09 MED ORDER — DEXAMETHASONE SODIUM PHOSPHATE 10 MG/ML IJ SOLN
INTRAMUSCULAR | Status: AC
  Filled 2018-08-09: qty 1

## 2018-08-09 MED ORDER — PHENOL 1.4 % MT LIQD: 1.0000 | OROMUCOSAL | Status: DC | PRN

## 2018-08-09 MED ORDER — MORPHINE SULFATE (PF) 2 MG/ML IV SOLN
2.0000 mg | INTRAVENOUS | Status: DC | PRN
  Administered 2018-08-09: 2 mg via INTRAVENOUS
  Filled 2018-08-09: qty 1

## 2018-08-09 MED ORDER — THROMBIN 5000 UNITS EX SOLR
OROMUCOSAL | Status: DC | PRN
  Administered 2018-08-09: 15:00:00

## 2018-08-09 MED ORDER — DEXAMETHASONE SODIUM PHOSPHATE 10 MG/ML IJ SOLN: 10.0000 mg | INTRAMUSCULAR | Status: DC

## 2018-08-09 MED ORDER — MIDAZOLAM HCL 2 MG/2ML IJ SOLN
INTRAMUSCULAR | Status: DC | PRN
  Administered 2018-08-09: 2 mg via INTRAVENOUS

## 2018-08-09 MED ORDER — LACTATED RINGERS IV SOLN
INTRAVENOUS | Status: DC
  Administered 2018-08-09 (×2): via INTRAVENOUS

## 2018-08-09 MED ORDER — BUPIVACAINE HCL (PF) 0.25 % IJ SOLN
INTRAMUSCULAR | Status: AC
  Filled 2018-08-09: qty 30

## 2018-08-09 MED ORDER — SUCCINYLCHOLINE CHLORIDE 200 MG/10ML IV SOSY
PREFILLED_SYRINGE | INTRAVENOUS | Status: DC | PRN
  Administered 2018-08-09: 140 mg via INTRAVENOUS

## 2018-08-09 MED ORDER — ONDANSETRON HCL 4 MG/2ML IJ SOLN: 4.0000 mg | Freq: Four times a day (QID) | INTRAMUSCULAR | Status: DC | PRN

## 2018-08-09 MED ORDER — VITAMIN A 10000 UNITS PO TABS: 10000.0000 [IU] | ORAL_TABLET | Freq: Every day | ORAL | Status: DC

## 2018-08-09 MED ORDER — CEFAZOLIN SODIUM-DEXTROSE 2-4 GM/100ML-% IV SOLN
2.0000 g | Freq: Three times a day (TID) | INTRAVENOUS | Status: AC
  Administered 2018-08-09 – 2018-08-10 (×2): 2 g via INTRAVENOUS
  Filled 2018-08-09 (×2): qty 100

## 2018-08-09 MED ORDER — CEFAZOLIN SODIUM-DEXTROSE 2-4 GM/100ML-% IV SOLN
INTRAVENOUS | Status: AC
  Filled 2018-08-09: qty 100

## 2018-08-09 MED ORDER — 0.9 % SODIUM CHLORIDE (POUR BTL) OPTIME
TOPICAL | Status: DC | PRN
  Administered 2018-08-09: 15:00:00 1000 mL

## 2018-08-09 MED ORDER — DEXAMETHASONE 4 MG PO TABS
4.0000 mg | ORAL_TABLET | Freq: Four times a day (QID) | ORAL | Status: DC
  Administered 2018-08-10: 4 mg via ORAL
  Filled 2018-08-09: qty 1

## 2018-08-09 MED ORDER — VANCOMYCIN HCL 1000 MG IV SOLR
INTRAVENOUS | Status: DC | PRN
  Administered 2018-08-09: 1000 mg

## 2018-08-09 MED ORDER — KETAMINE HCL 10 MG/ML IJ SOLN
INTRAMUSCULAR | Status: DC | PRN
  Administered 2018-08-09 (×5): 10 mg via INTRAVENOUS

## 2018-08-09 MED ORDER — MENTHOL 3 MG MT LOZG: 1.0000 | LOZENGE | OROMUCOSAL | Status: DC | PRN

## 2018-08-09 MED ORDER — VITAMIN E 180 MG (400 UNIT) PO CAPS: 400.0000 [IU] | ORAL_CAPSULE | ORAL | Status: DC

## 2018-08-09 MED ORDER — VITAMIN K2 100 MCG PO CAPS: 500.0000 ug | ORAL_CAPSULE | Freq: Every day | ORAL | Status: DC

## 2018-08-09 MED ORDER — SUGAMMADEX SODIUM 200 MG/2ML IV SOLN
INTRAVENOUS | Status: DC | PRN
  Administered 2018-08-09: 400 mg via INTRAVENOUS

## 2018-08-09 SURGICAL SUPPLY — 59 items
BAG DECANTER FOR FLEXI CONT (MISCELLANEOUS) ×2 IMPLANT
BASKET BONE COLLECTION (BASKET) ×2 IMPLANT
BENZOIN TINCTURE PRP APPL 2/3 (GAUZE/BANDAGES/DRESSINGS) ×2 IMPLANT
BLADE CLIPPER SURG (BLADE) IMPLANT
BONE VIVIGEN FORMABLE 5.4CC (Bone Implant) ×2 IMPLANT
BUR MATCHSTICK NEURO 3.0 LAGG (BURR) ×2 IMPLANT
CANISTER SUCT 3000ML PPV (MISCELLANEOUS) ×2 IMPLANT
CARTRIDGE OIL MAESTRO DRILL (MISCELLANEOUS) ×1 IMPLANT
CONT SPEC 4OZ CLIKSEAL STRL BL (MISCELLANEOUS) ×2 IMPLANT
COVER BACK TABLE 60X90IN (DRAPES) ×2 IMPLANT
COVER WAND RF STERILE (DRAPES) ×2 IMPLANT
DERMABOND ADVANCED (GAUZE/BANDAGES/DRESSINGS) ×1
DERMABOND ADVANCED .7 DNX12 (GAUZE/BANDAGES/DRESSINGS) ×1 IMPLANT
DIFFUSER DRILL AIR PNEUMATIC (MISCELLANEOUS) ×2 IMPLANT
DRAPE C-ARM 42X72 X-RAY (DRAPES) ×4 IMPLANT
DRAPE LAPAROTOMY 100X72X124 (DRAPES) ×2 IMPLANT
DRAPE POUCH INSTRU U-SHP 10X18 (DRAPES) ×2 IMPLANT
DRAPE SURG 17X23 STRL (DRAPES) ×2 IMPLANT
DRSG OPSITE POSTOP 4X6 (GAUZE/BANDAGES/DRESSINGS) ×2 IMPLANT
DURAPREP 26ML APPLICATOR (WOUND CARE) ×2 IMPLANT
ELECT REM PT RETURN 9FT ADLT (ELECTROSURGICAL) ×2
ELECTRODE REM PT RTRN 9FT ADLT (ELECTROSURGICAL) ×1 IMPLANT
EVACUATOR 1/8 PVC DRAIN (DRAIN) ×2 IMPLANT
GAUZE 4X4 16PLY RFD (DISPOSABLE) IMPLANT
GLOVE BIO SURGEON STRL SZ7 (GLOVE) IMPLANT
GLOVE BIO SURGEON STRL SZ8 (GLOVE) ×4 IMPLANT
GLOVE BIOGEL PI IND STRL 7.0 (GLOVE) IMPLANT
GLOVE BIOGEL PI INDICATOR 7.0 (GLOVE)
GOWN STRL REUS W/ TWL LRG LVL3 (GOWN DISPOSABLE) IMPLANT
GOWN STRL REUS W/ TWL XL LVL3 (GOWN DISPOSABLE) ×2 IMPLANT
GOWN STRL REUS W/TWL 2XL LVL3 (GOWN DISPOSABLE) IMPLANT
GOWN STRL REUS W/TWL LRG LVL3 (GOWN DISPOSABLE)
GOWN STRL REUS W/TWL XL LVL3 (GOWN DISPOSABLE) ×2
HEMOSTAT POWDER KIT SURGIFOAM (HEMOSTASIS) IMPLANT
KIT BASIN OR (CUSTOM PROCEDURE TRAY) ×2 IMPLANT
KIT TURNOVER KIT B (KITS) ×2 IMPLANT
MILL MEDIUM DISP (BLADE) ×2 IMPLANT
NEEDLE HYPO 25X1 1.5 SAFETY (NEEDLE) ×2 IMPLANT
NS IRRIG 1000ML POUR BTL (IV SOLUTION) ×2 IMPLANT
OIL CARTRIDGE MAESTRO DRILL (MISCELLANEOUS) ×2
PACK LAMINECTOMY NEURO (CUSTOM PROCEDURE TRAY) ×2 IMPLANT
PAD ARMBOARD 7.5X6 YLW CONV (MISCELLANEOUS) ×6 IMPLANT
ROD LORDOTIC TI 5.5X40 KODIAK (Rod) ×4 IMPLANT
SCREW ILIAC PA 5.5X40 (Screw) ×8 IMPLANT
SET SCREW (Screw) ×4 IMPLANT
SET SCREW SPNE (Screw) ×4 IMPLANT
SPACER PS IDENTITI 7X9X25 0D (Spacer) ×4 IMPLANT
SPONGE LAP 4X18 RFD (DISPOSABLE) IMPLANT
SPONGE SURGIFOAM ABS GEL 100 (HEMOSTASIS) ×2 IMPLANT
STRIP CLOSURE SKIN 1/2X4 (GAUZE/BANDAGES/DRESSINGS) ×2 IMPLANT
SUT VIC AB 0 CT1 18XCR BRD8 (SUTURE) ×1 IMPLANT
SUT VIC AB 0 CT1 8-18 (SUTURE) ×1
SUT VIC AB 2-0 CP2 18 (SUTURE) ×4 IMPLANT
SUT VIC AB 3-0 SH 8-18 (SUTURE) ×4 IMPLANT
SYR CONTROL 10ML LL (SYRINGE) ×2 IMPLANT
TOWEL GREEN STERILE (TOWEL DISPOSABLE) ×2 IMPLANT
TOWEL GREEN STERILE FF (TOWEL DISPOSABLE) ×2 IMPLANT
TRAY FOLEY MTR SLVR 16FR STAT (SET/KITS/TRAYS/PACK) ×2 IMPLANT
WATER STERILE IRR 1000ML POUR (IV SOLUTION) ×2 IMPLANT

## 2018-08-09 NOTE — Anesthesia Preprocedure Evaluation (Signed)
Anesthesia Evaluation  Patient identified by MRN, date of birth, ID band Patient awake    Reviewed: Allergy & Precautions, NPO status , Patient's Chart, lab work & pertinent test results  Airway Mallampati: II  TM Distance: >3 FB     Dental   Pulmonary sleep apnea ,    breath sounds clear to auscultation       Cardiovascular negative cardio ROS   Rhythm:Regular Rate:Normal     Neuro/Psych History noted. CG    GI/Hepatic negative GI ROS, Neg liver ROS,   Endo/Other  negative endocrine ROS  Renal/GU negative Renal ROS     Musculoskeletal  (+) Arthritis ,   Abdominal   Peds  Hematology   Anesthesia Other Findings   Reproductive/Obstetrics                             Anesthesia Physical Anesthesia Plan  ASA: III  Anesthesia Plan: General   Post-op Pain Management:    Induction: Intravenous  PONV Risk Score and Plan: Ondansetron, Dexamethasone and Midazolam  Airway Management Planned:   Additional Equipment:   Intra-op Plan:   Post-operative Plan: Possible Post-op intubation/ventilation  Informed Consent: I have reviewed the patients History and Physical, chart, labs and discussed the procedure including the risks, benefits and alternatives for the proposed anesthesia with the patient or authorized representative who has indicated his/her understanding and acceptance.     Dental advisory given  Plan Discussed with: CRNA and Anesthesiologist  Anesthesia Plan Comments:         Anesthesia Quick Evaluation

## 2018-08-09 NOTE — Anesthesia Postprocedure Evaluation (Signed)
Anesthesia Post Note  Patient: Kevin Griffith  Procedure(s) Performed: PLIF - L4-L5 - Posterior Lateral and Interbody fusion (N/A Back)     Patient location during evaluation: PACU Anesthesia Type: General Level of consciousness: awake Pain management: pain level controlled Vital Signs Assessment: post-procedure vital signs reviewed and stable Respiratory status: spontaneous breathing Cardiovascular status: stable Postop Assessment: no apparent nausea or vomiting Anesthetic complications: no    Last Vitals:  Vitals:   08/09/18 1740 08/09/18 1755  BP: (!) 173/89 (!) 171/103  Pulse: 79 75  Resp: 13 17  Temp: 36.5 C   SpO2: 100% 100%    Last Pain:  Vitals:   08/09/18 1740  PainSc: 6                  Terrianne Cavness

## 2018-08-09 NOTE — Op Note (Signed)
08/09/2018  5:34 PM  PATIENT:  Kevin Griffith  68 y.o. male  PRE-OPERATIVE DIAGNOSIS: Recurrent disc herniation L4-5 on the left with back and left greater than right leg pain  POST-OPERATIVE DIAGNOSIS:  same  PROCEDURE:   1. Decompressive lumbar laminectomy L4-5 bilaterally requiring more work than would be required for a simple exposure of the disk for PLIF in order to adequately decompress the neural elements and address the spinal stenosis, with repeat discectomy L4-5 on the left 2. Posterior lumbar interbody fusion L4-5 using his titanium interbody cages packed with morcellized allograft and autograft  3. Posterior fixation L4-5 using ATEC cortical pedicle screws.  4. Intertransverse arthrodesis L4 5 using morcellized autograft and allograft.  SURGEON:  Sherley Bounds, MD  ASSISTANTS: Glenford Peers FNP  ANESTHESIA:  General  EBL: 500 ml  Total I/O In: 1000 [I.V.:1000] Out: 1100 [Urine:600; Blood:500]  BLOOD ADMINISTERED:none  DRAINS: none   INDICATION FOR PROCEDURE: This patient presented with recurrent left leg pain.  It had 2 previous microdiscectomies at L4-5 on the left. Imaging revealed calcified disc herniation with severe left lateral recess stenosis and L5 nerve root compression. The patient tried a reasonable attempt at conservative medical measures without relief. I recommended decompression and instrumented fusion to address the stenosis as well as the time recurrent disc herniation.  Patient understood the risks, benefits, and alternatives and potential outcomes and wished to proceed.  PROCEDURE DETAILS:  The patient was brought to the operating room. After induction of generalized endotracheal anesthesia the patient was rolled into the prone position on chest rolls and all pressure points were padded. The patient's lumbar region was cleaned and then prepped with DuraPrep and draped in the usual sterile fashion. Anesthesia was injected and then a dorsal midline  incision was made and carried down to the lumbosacral fascia. The fascia was opened and the paraspinous musculature was taken down in a subperiosteal fashion to expose L4-5. A self-retaining retractor was placed. Intraoperative fluoroscopy confirmed my level, and I started with placement of the L4 cortical pedicle screws. The pedicle screw entry zones were identified utilizing surface landmarks and  AP and lateral fluoroscopy. I scored the cortex with the high-speed drill and then used the hand drill to drill an upward and outward direction into the pedicle. I then tapped line to line. I then placed a 5.5 x 40 mm cortical pedicle screw into the pedicles of L4 bilaterally.  .  I then turned my attention to the decompression and complete lumbar laminectomies, hemi- facetectomies, and foraminotomies were performed at 4 5. The patient had significant spinal stenosis and this required more work than would be required for a simple exposure of the disc for posterior lumbar interbody fusion which would only require a limited laminotomy.  Significant scarring at L4-5 on the left with a large calcified disc herniation.  He spent time trying to knock down the calcified disc herniation into the midline and retrieve it from the disc space.  Given this was his third time recurrence and had 2 previous surgeries there was significant epidural fibrosis and therefore is very difficult to remove all of the calcified disc herniation.  We did the best we could remove as much as we could safely.  Much more generous decompression and generous foraminotomy was undertaken in order to adequately decompress the neural elements and address the patient's leg pain. The yellow ligament was removed to expose the underlying dura and nerve roots, and generous foraminotomies were performed to adequately decompress  the neural elements. Both the exiting and traversing nerve roots were decompressed on both sides until a coronary dilator passed easily  along the nerve roots. Once the decompression was complete, I turned my attention to the posterior lower lumbar interbody fusion. The epidural venous vasculature was coagulated and cut sharply. Disc space was incised and the initial discectomy was performed with pituitary rongeurs. The disc space was distracted with sequential distractors to a height of 7 mm. We then used a series of scrapers and shavers to prepare the endplates for fusion. The midline was prepared with Epstein curettes. Once the complete discectomy was finished, we packed an appropriate sized interbody cage with local autograft and morcellized allograft, gently retracted the nerve root, and tapped the cage into position at L4-5 bilaterally.  The midline between the cages was packed with morselized autograft and allograft. We then turned our attention to the placement of the lower pedicle screws. The pedicle screw entry zones were identified utilizing surface landmarks and fluoroscopy. I drilled into each pedicle utilizing the hand drill, and tapped each pedicle with the appropriate tap. We palpated with a ball probe to assure no break in the cortex. We then placed 5.5 x 40 mm pedicle screws into the pedicles bilaterally at L5. We then decorticated the transverse processes and laid a mixture of morcellized autograft and allograft out over these to perform intertransverse arthrodesis at L4-5. We then placed lordotic rods into the multiaxial screw heads of the pedicle screws and locked these in position with the locking caps and anti-torque device. We then checked our construct with AP and lateral fluoroscopy. Irrigated with copious amounts of bacitracin-containing saline solution. Inspected the nerve roots once again to assure adequate decompression, lined to the dura with Gelfoam, placed powdered vancomycin into the wound, and closed the muscle and the fascia with 0 Vicryl. Closed the subcutaneous tissues with 2-0 Vicryl and subcuticular tissues  with 3-0 Vicryl. The skin was closed with benzoin and Steri-Strips. Dressing was then applied, the patient was awakened from general anesthesia and transported to the recovery room in stable condition. At the end of the procedure all sponge, needle and instrument counts were correct.   PLAN OF CARE: admit to inpatient  PATIENT DISPOSITION:  PACU - hemodynamically stable.   Delay start of Pharmacological VTE agent (>24hrs) due to surgical blood loss or risk of bleeding:  yes

## 2018-08-09 NOTE — Transfer of Care (Signed)
Immediate Anesthesia Transfer of Care Note  Patient: Kevin Griffith  Procedure(s) Performed: PLIF - L4-L5 - Posterior Lateral and Interbody fusion (N/A Back)  Patient Location: PACU  Anesthesia Type:General  Level of Consciousness: awake, alert  and oriented  Airway & Oxygen Therapy: Patient Spontanous Breathing and Patient connected to face mask oxygen  Post-op Assessment: Report given to RN and Post -op Vital signs reviewed and stable  Post vital signs: Reviewed and stable  Last Vitals:  Vitals Value Taken Time  BP 173/89 08/09/2018  5:40 PM  Temp    Pulse 81 08/09/2018  5:43 PM  Resp 21 08/09/2018  5:43 PM  SpO2 100 % 08/09/2018  5:43 PM  Vitals shown include unvalidated device data.  Last Pain:  Vitals:   08/09/18 1058  PainSc: 6       Patients Stated Pain Goal: 3 (43/53/91 2258)  Complications: No apparent anesthesia complications

## 2018-08-09 NOTE — H&P (Signed)
Subjective: Patient is a 68 y.o. male admitted for PLIF. Onset of symptoms was several years ago, gradually worsening since that time.  The pain is rated severe, and is located at the across the lower back and radiates to LLE. The pain is described as aching and occurs all day. The symptoms have been progressive. Symptoms are exacerbated by exercise. MRI or CT showed recurrent HNP with stenosis L4-5   Past Medical History:  Diagnosis Date  . Asymptomatic microscopic hematuria   . Back pain, chronic   . Cervical stenosis of spine   . Colon polyps   . DDD (degenerative disc disease), cervical   . DDD (degenerative disc disease), lumbar   . Drug rash   . ED (erectile dysfunction)   . Hyperbilirubinemia   . Hypertriglyceridemia   . IFG (impaired fasting glucose)   . Insomnia disorder   . Lactose intolerance   . Mood change   . MRSA (methicillin resistant Staphylococcus aureus)   . Palpitations   . Paroxysmal A-fib ([place])   . Skin problem   . Sleep apnea, obstructive   . Vitamin D deficiency     Past Surgical History:  Procedure Laterality Date  . APPENDECTOMY  1970  . [place], 07-2001  . HEMIDISCECECTOMY      Prior to Admission medications   Medication Sig Start Date End Date Taking? Authorizing Provider  Ascorbic Acid (VITAMIN C PO) Take 3,000 mg by mouth daily.   Yes [provider]  B Complex Vitamins (VITAMIN-B COMPLEX PO) Take 1 tablet by mouth daily.   Yes [provider]  Cholecalciferol (VITAMIN D3) 10000 units capsule Take 10,000 Units by mouth daily.   Yes [provider]  Levomefolate Glucosamine (METHYLFOLATE) 400 MCG CAPS Take 400 mcg by mouth daily.    Yes [provider]  Menaquinone-7 (VITAMIN K2 PO) Take 500 mcg by mouth daily.   Yes [provider]  Multiple Minerals (MINERAL COMPLEX PO) Take 3 tablets by mouth daily.    Yes [provider]  traMADol (ULTRAM) 50 MG tablet Take 50 mg by mouth every  12 (twelve) hours as needed for severe pain.   Yes [provider]  Vitamin A 10000 units TABS Take 10,000 Units by mouth daily.    Yes [provider]  vitamin B-12 (CYANOCOBALAMIN) 1000 MCG tablet Take 1,000 mcg by mouth daily.   Yes [provider]  vitamin E 400 UNIT capsule Take 400 Units by mouth every 14 (fourteen) days.    Yes [provider]   Allergies  Allergen Reactions  . Acetaminophen Other (See Comments)    Increases blood pressure   . Sulfa Antibiotics Rash    Social History   Tobacco Use  . Smoking status: Never Smoker  . Smokeless tobacco: Never Used  Substance Use Topics  . Alcohol use: Yes    Alcohol/week: 3.0 standard drinks    Types: 3 Glasses of wine per week    Comment: occ wine or beer 2-3X/week    Family History  Problem Relation Age of Onset  . Cerebral aneurysm Mother   . Hypertension Mother   . Heart attack Sister 61       oldest sister  . Crohn's disease Brother   . Diabetes Mellitus II Sister   . Heart disease Sister   . Crohn's disease Brother      Review of Systems  Positive ROS: neg  All other systems have been reviewed and were otherwise  negative with the exception of those mentioned in the HPI and as above.  Objective: Vital signs in last 24 hours: Temp:  [98 F (36.7 C)] 98 F (36.7 C) (04/13 1051) Pulse Rate:  [82] 82 (04/13 1051) Resp:  [20] 20 (04/13 1051) BP: (145-216)/(59-114) 145/59 (04/13 1128) SpO2:  [99 %] 99 % (04/13 1051) Weight:  [104.3 kg] 104.3 kg (04/13 1051)  General Appearance: Alert, cooperative, no distress, appears stated age Head: Normocephalic, without obvious abnormality, atraumatic Eyes: PERRL, conjunctiva/corneas clear, EOM's intact    Neck: Supple, symmetrical, trachea midline Back: Symmetric, no curvature, ROM normal, no CVA tenderness Lungs:  respirations unlabored Heart: Regular rate and rhythm Abdomen: Soft, non-tender Extremities: Extremities normal,  atraumatic, no cyanosis or edema Pulses: 2+ and symmetric all extremities Skin: Skin color, texture, turgor normal, no rashes or lesions  NEUROLOGIC:   Mental status: Alert and oriented x4,  no aphasia, good attention span, fund of knowledge, and memory Motor Exam - grossly normal Sensory Exam - grossly normal Reflexes: trace Coordination - grossly normal Gait - grossly normal Balance - grossly normal Cranial Nerves: I: smell Not tested  II: visual acuity  OS: nl    OD: nl  II: visual fields Full to confrontation  II: pupils Equal, round, reactive to light  III,VII: ptosis None  III,IV,VI: extraocular muscles  Full ROM  V: mastication Normal  V: facial light touch sensation  Normal  V,VII: corneal reflex  Present  VII: facial muscle function - upper  Normal  VII: facial muscle function - lower Normal  VIII: hearing Not tested  IX: soft palate elevation  Normal  IX,X: gag reflex Present  XI: trapezius strength  5/5  XI: sternocleidomastoid strength 5/5  XI: neck flexion strength  5/5  XII: tongue strength  Normal    Data Review Lab Results  Component Value Date   WBC 8.8 07/30/2018   HGB 15.5 07/30/2018   HCT 46.1 07/30/2018   MCV 91.8 07/30/2018   PLT 210 07/30/2018   Lab Results  Component Value Date   NA 139 07/30/2018   K 4.1 07/30/2018   CL 107 07/30/2018   CO2 25 07/30/2018   BUN 15 07/30/2018   CREATININE 1.20 07/30/2018   GLUCOSE 106 (H) 07/30/2018   Lab Results  Component Value Date   INR 1.1 07/30/2018    Assessment/Plan:  Estimated body mass index is 31.19 kg/m as calculated from the following:   Height as of this encounter: 6' (1.829 m).   Weight as of this encounter: 104.3 kg. Patient admitted for PLIF L4-5. Patient has failed a reasonable attempt at conservative therapy.  I explained the condition and procedure to the patient and answered any questions.  Patient wishes to proceed with procedure as planned. Understands risks/ benefits and  typical outcomes of procedure.   Eustace Moore 08/09/2018 2:05 PM

## 2018-08-09 NOTE — Anesthesia Procedure Notes (Signed)
Procedure Name: Intubation Date/Time: 08/09/2018 2:58 PM Performed by: Teressa Lower., CRNA Pre-anesthesia Checklist: Patient identified, Emergency Drugs available, Suction available and Patient being monitored Patient Re-evaluated:Patient Re-evaluated prior to induction Oxygen Delivery Method: Circle system utilized Preoxygenation: Pre-oxygenation with 100% oxygen Induction Type: IV induction and Rapid sequence Laryngoscope Size: Glidescope and 4 Grade View: Grade I Tube type: Oral Tube size: 7.5 mm Number of attempts: 1 Airway Equipment and Method: Video-laryngoscopy Placement Confirmation: ETT inserted through vocal cords under direct vision,  positive ETCO2 and breath sounds checked- equal and bilateral Secured at: 23 cm Tube secured with: Tape Dental Injury: Teeth and Oropharynx as per pre-operative assessment

## 2018-08-10 MED ORDER — METHOCARBAMOL 500 MG PO TABS: 500.0000 mg | ORAL_TABLET | Freq: Four times a day (QID) | ORAL | 1 refills | Status: DC | PRN

## 2018-08-10 MED ORDER — OXYCODONE HCL 5 MG PO TABS: 5.0000 mg | ORAL_TABLET | ORAL | 0 refills | Status: DC | PRN

## 2018-08-10 NOTE — Evaluation (Signed)
Physical Therapy Evaluation - One-Time Eval Patient Details Name: Kevin Griffith MRN: [id number] DOB: 1950-08-13 Today's Date: 08/10/2018   History of Present Illness  68 yo male s/p PLIF L4-L5, lumbar laminectomy L4-L5 on 08/09/18. PMH includes DDD cervical and lumbar spine, mood changes, MRSA, previous spinal surgery.  Clinical Impression   Pt presents with mild LBP, increased time to perform mobility tasks, and initially decreased knowledge of spinal precautions.  Pt educated on all precautions, administered a handout, and reviewed this handout thoroughly with pt. Pt states no questions about donning/doffing brace or spinal precautions. Pt ambulated short room distance, not due to difficulty with activity tolerance but due to pt's eagerness to d/c and unwillingness to mobilize further in hallway. Additionally, pt performs stair navigation proficiently. Pt to d/c home with wife, no further acute PT needs. PT to d/c shortly.     Follow Up Recommendations No PT follow up;Supervision for mobility/OOB    Equipment Recommendations  None recommended by PT    Recommendations for Other Services       Precautions / Restrictions Precautions Precautions: Back Precaution Booklet Issued: Yes (comment) Precaution Comments: handout administered, reviewed, and practiced. Pt deferred bed mobility and log roll technique at this time, as pt is d/cing shortly and is anxious about wife waiting for him.  Required Braces or Orthoses: Spinal Brace Spinal Brace: Lumbar corset;Other (comment) Spinal Brace Comments: Pt already wearing lumbar corset upon PT arrival, pt refuses further education on lumbar corset as pt states he knows how to do it.  Restrictions Weight Bearing Restrictions: No      Mobility  Bed Mobility               General bed mobility comments: Pt up in chair upon PT arrival, and requested sitting in transport chair upon PT exit for d/c.   Transfers Overall transfer level: Needs  assistance Equipment used: Straight cane Transfers: Sit to/from Stand Sit to Stand: Supervision         General transfer comment: Supervision for safety, PT reinforcing bending from hips and knees when standing and sitting. Sit to stand x2, twice from transfer chair to ambulate around room and to practice stair navigation.   Ambulation/Gait Ambulation/Gait assistance: Supervision Gait Distance (Feet): 35 Feet Assistive device: Straight cane Gait Pattern/deviations: Step-through pattern;Decreased stride length;Trunk flexed Gait velocity: slightly decr    General Gait Details: Supervision for safety. Pt steady with gait and confident with cane use, as pt has been using cane for several years. Ambulation distance limited by pt's eagerness to d/c, pt defers further mobility.   Stairs Stairs: Yes Stairs assistance: Supervision Stair Management: One rail Left;Alternating pattern;Forwards;With cane Number of Stairs: 10 General stair comments: Pt with step over step stair navigation, no evidence of unsteadiness with stair nagivation.    Wheelchair Mobility    Modified Rankin (Stroke Patients Only)       Balance Overall balance assessment: Mild deficits observed, not formally tested                                           Pertinent Vitals/Pain Pain Assessment: Faces Faces Pain Scale: Hurts a little bit Pain Location: back, with transfers Pain Descriptors / Indicators: Grimacing;Guarding Pain Intervention(s): Limited activity within patient's tolerance;Monitored during session;Repositioned    Home Living Family/patient expects to be discharged to:: Private residence Living Arrangements: Spouse/significant other Available Help at  Discharge: Family;Available PRN/intermittently Type of Home: House Home Access: Stairs to enter Entrance Stairs-Rails: Chemical engineer of Steps: a few Home Layout: Two level;Bed/bath upstairs Home Equipment:  Toilet riser;Walker - 2 wheels;Cane - single point;Shower seat;Hand held shower head      Prior Function Level of Independence: Independent with assistive device(s)         Comments: Pt reports using SPC for the past several years      Hand Dominance   Dominant Hand: Right    Extremity/Trunk Assessment   Upper Extremity Assessment Upper Extremity Assessment: Overall WFL for tasks assessed    Lower Extremity Assessment Lower Extremity Assessment: Overall WFL for tasks assessed    Cervical / Trunk Assessment Cervical / Trunk Assessment: Normal  Communication   Communication: No difficulties  Cognition Arousal/Alertness: Awake/alert Behavior During Therapy: WFL for tasks assessed/performed Overall Cognitive Status: Within Functional Limits for tasks assessed                                 General Comments: Pt irritated upon PT arrival, called and told wife while PT was in the room that "PT is trying to lecture me, I will need a few more minutes (before d/c)"      General Comments      Exercises     Assessment/Plan    PT Assessment Patent does not need any further PT services  PT Problem List Decreased knowledge of precautions;Pain;Decreased activity tolerance       PT Treatment Interventions  N/a     PT Goals (Current goals can be found in the Care Plan section)  Acute Rehab PT Goals Patient Stated Goal: to go home ASAP PT Goal Formulation: With patient Time For Goal Achievement: 08/10/18 Potential to Achieve Goals: Good    Frequency  N/A   Barriers to discharge        Co-evaluation               AM-PAC PT "6 Clicks" Mobility  Outcome Measure Help needed turning from your back to your side while in a flat bed without using bedrails?: A Little Help needed moving from lying on your back to sitting on the side of a flat bed without using bedrails?: A Little Help needed moving to and from a bed to a chair (including a wheelchair)?:  None Help needed standing up from a chair using your arms (e.g., wheelchair or bedside chair)?: None Help needed to walk in hospital room?: None Help needed climbing 3-5 steps with a railing? : None 6 Click Score: 22    End of Session Equipment Utilized During Treatment: Back brace Activity Tolerance: Patient tolerated treatment well Patient left: in chair;with call bell/phone within reach Nurse Communication: Mobility status PT Visit Diagnosis: Other abnormalities of gait and mobility (R26.89)    Time: 1020-1031 PT Time Calculation (min) (ACUTE ONLY): 11 min   Charges:   PT Evaluation $PT Eval Low Complexity: 1 Low          Julien Girt, PT Acute Rehabilitation Services Pager [phone]  Office [phone]   Kyanne Rials D Elonda Husky 08/10/2018, 10:51 AM

## 2018-08-10 NOTE — Discharge Summary (Signed)
Physician Discharge Summary  Patient ID: Kevin Griffith MRN: [id number] DOB/AGE: 1950/08/18 68 y.o.  Admit date: 08/09/2018 Discharge date: 08/10/2018  Admission Diagnoses: Recurrent disc herniation L4-5 on the left with back and left greater than right leg pain  Discharge Diagnoses: same   Discharged Condition: good  Hospital Course: The patient was admitted on 08/09/2018 and taken to the operating room where the patient underwent PLIF L4-L5. The patient tolerated the procedure well and was taken to the recovery room and then to the floor in stable condition. The hospital course was routine. There were no complications. The wound remained clean dry and intact. Pt had appropriate back soreness. No complaints of leg pain or new N/T/W. The patient remained afebrile with stable vital signs, and tolerated a regular diet. The patient continued to increase activities, and pain was well controlled with oral pain medications.   Consults: None  Significant Diagnostic Studies:  Results for orders placed or performed during the hospital encounter of 07/30/18  Surgical pcr screen  Result Value Ref Range   MRSA, PCR NEGATIVE NEGATIVE   Staphylococcus aureus NEGATIVE NEGATIVE  Basic metabolic panel  Result Value Ref Range   Sodium 139 135 - 145 mmol/L   Potassium 4.1 3.5 - 5.1 mmol/L   Chloride 107 98 - 111 mmol/L   CO2 25 22 - 32 mmol/L   Glucose, Bld 106 (H) 70 - 99 mg/dL   BUN 15 8 - 23 mg/dL   Creatinine, Ser 1.20 0.61 - 1.24 mg/dL   Calcium 9.0 8.9 - 10.3 mg/dL   GFR calc non Af Amer >60 >60 mL/min   GFR calc Af Amer >60 >60 mL/min   Anion gap 7 5 - 15  CBC WITH DIFFERENTIAL  Result Value Ref Range   WBC 8.8 4.0 - 10.5 K/uL   RBC 5.02 4.22 - 5.81 MIL/uL   Hemoglobin 15.5 13.0 - 17.0 g/dL   HCT 46.1 39.0 - 52.0 %   MCV 91.8 80.0 - 100.0 fL   MCH 30.9 26.0 - 34.0 pg   MCHC 33.6 30.0 - 36.0 g/dL   RDW 12.0 11.5 - 15.5 %   Platelets 210 150 - 400 K/uL   nRBC 0.0 0.0 - 0.2 %    Neutrophils Relative % 62 %   Neutro Abs 5.5 1.7 - 7.7 K/uL   Lymphocytes Relative 24 %   Lymphs Abs 2.1 0.7 - 4.0 K/uL   Monocytes Relative 10 %   Monocytes Absolute 0.9 0.1 - 1.0 K/uL   Eosinophils Relative 2 %   Eosinophils Absolute 0.2 0.0 - 0.5 K/uL   Basophils Relative 1 %   Basophils Absolute 0.1 0.0 - 0.1 K/uL   Immature Granulocytes 1 %   Abs Immature Granulocytes 0.06 0.00 - 0.07 K/uL  Protime-INR  Result Value Ref Range   Prothrombin Time 14.0 11.4 - 15.2 seconds   INR 1.1 0.8 - 1.2  Type and screen All Cardiac and thoracic surgeries, spinal fusions, myomectomies, craniotomies, colon & liver resections, total joint revisions, same day c-section with placenta previa or accreta.  Result Value Ref Range   ABO/RH(D) A POS    Antibody Screen NEG    Sample Expiration 08/13/2018    Extend sample reason      NO TRANSFUSIONS OR PREGNANCY IN THE PAST 3 MONTHS Performed at [facility], [address] [place], [place] [address]   ABO/Rh  Result Value Ref Range   ABO/RH(D)      A POS  Performed at [facility], [place] [address], [place], [place] [address]     Chest 2 View  Result Date: 07/30/2018 CLINICAL DATA:  Preop lumbar fusion EXAM: CHEST - 2 VIEW COMPARISON:  None. FINDINGS: Heart and mediastinal contours are within normal limits. No focal opacities or effusions. No acute bony abnormality. IMPRESSION: No active cardiopulmonary disease. Electronically Signed   By: Rolm Baptise M.D.   On: 07/30/2018 09:59   Dg Lumbar Spine 2-3 Views  Result Date: 08/09/2018 CLINICAL DATA:  Posterior lumbar fusion at L4-5 EXAM: DG C-ARM 61-120 MIN; LUMBAR SPINE - 2-3 VIEW COMPARISON:  None FLUOROSCOPY TIME:  1 minutes 5 seconds FINDINGS: Three intraoperative fluoroscopic spot images. Posterior lumbar interbody fusion at L4-5. Degenerative disease with disc height loss at L3-4 and L5-S1. IMPRESSION: Intraoperative localization. Electronically Signed   By: Kathreen Devoid   On: 08/09/2018  17:17   Dg C-arm 1-60 Min  Result Date: 08/09/2018 CLINICAL DATA:  Posterior lumbar fusion at L4-5 EXAM: DG C-ARM 61-120 MIN; LUMBAR SPINE - 2-3 VIEW COMPARISON:  None FLUOROSCOPY TIME:  1 minutes 5 seconds FINDINGS: Three intraoperative fluoroscopic spot images. Posterior lumbar interbody fusion at L4-5. Degenerative disease with disc height loss at L3-4 and L5-S1. IMPRESSION: Intraoperative localization. Electronically Signed   By: Kathreen Devoid   On: 08/09/2018 17:17    Antibiotics:  Anti-infectives (From admission, onward)   Start     Dose/Rate Route Frequency Ordered Stop   08/09/18 2200  ceFAZolin (ANCEF) IVPB 2g/100 mL premix     2 g 200 mL/hr over 30 Minutes Intravenous Every 8 hours 08/09/18 2013 08/10/18 0735   08/09/18 1324  bacitracin 50,000 Units in sodium chloride 0.9 % 500 mL irrigation  Status:  Discontinued       As needed 08/09/18 1324 08/09/18 1733   08/09/18 1303  vancomycin (VANCOCIN) powder  Status:  Discontinued       As needed 08/09/18 1303 08/09/18 1733   08/09/18 1049  ceFAZolin (ANCEF) 2-4 GM/100ML-% IVPB    Note to Pharmacy:  Jasmine Pang   : cabinet override      08/09/18 1049 08/09/18 2259   08/09/18 1045  ceFAZolin (ANCEF) IVPB 2g/100 mL premix     2 g 200 mL/hr over 30 Minutes Intravenous On call to O.R. 08/09/18 1040 08/09/18 1433      Discharge Exam: Blood pressure (!) 158/83, pulse 90, temperature 97.9 F (36.6 C), resp. rate 20, height 6' (1.829 m), weight 104.3 kg, SpO2 96 %. Neurologic: Grossly normal Ambulating and voiding well, incision CDI  Discharge Medications:   Allergies as of 08/10/2018      Reactions   Acetaminophen Other (See Comments)   Increases blood pressure   Sulfa Antibiotics Rash      Medication List    STOP taking these medications   traMADol 50 MG tablet Commonly known as:  ULTRAM     TAKE these medications   methocarbamol 500 MG tablet Commonly known as:  ROBAXIN Take 1 tablet (500 mg total) by mouth every 6  (six) hours as needed for muscle spasms.   MethylFolate 400 MCG Caps Generic drug:  Levomefolate Glucosamine Take 400 mcg by mouth daily.   MINERAL COMPLEX PO Take 3 tablets by mouth daily.   oxyCODONE 5 MG immediate release tablet Commonly known as:  Oxy IR/ROXICODONE Take 1 tablet (5 mg total) by mouth every 4 (four) hours as needed for moderate pain ((score 4 to 6)).   Vitamin A 10000  units Tabs Take 10,000 Units by mouth daily.   vitamin B-12 1000 MCG tablet Commonly known as:  CYANOCOBALAMIN Take 1,000 mcg by mouth daily.   VITAMIN C PO Take 3,000 mg by mouth daily.   Vitamin D3 250 MCG (10000 UT) capsule Take 10,000 Units by mouth daily.   vitamin E 400 UNIT capsule Take 400 Units by mouth every 14 (fourteen) days.   VITAMIN K2 PO Take 500 mcg by mouth daily.   VITAMIN-B COMPLEX PO Take 1 tablet by mouth daily.            Durable Medical Equipment  (From admission, onward)         Start     Ordered   08/09/18 2014  DME Walker rolling  Once    Question:  Patient needs a walker to treat with the following condition  Answer:  S/P lumbar fusion   08/09/18 2013   08/09/18 2014  DME 3 n 1  Once     08/09/18 2013          Disposition: home   Final Dx: PLIF L4-L5  Discharge Instructions     Remove dressing in 72 hours   Complete by:  As directed    Call MD for:  difficulty breathing, headache or visual disturbances   Complete by:  As directed    Call MD for:  persistant nausea and vomiting   Complete by:  As directed    Call MD for:  redness, tenderness, or signs of infection (pain, swelling, redness, odor or green/yellow discharge around incision site)   Complete by:  As directed    Call MD for:  severe uncontrolled pain   Complete by:  As directed    Call MD for:  temperature >100.4   Complete by:  As directed    Diet - low sodium heart healthy   Complete by:  As directed    Increase activity slowly   Complete by:  As directed        Follow-up Information    Eustace Moore, MD. Schedule an appointment as soon as possible for a visit in 2 week(s).   Specialty:  Neurosurgery Contact information: [address] [place] [address] [place] [address] [phone]            Signed: Ocie Cornfield [facility] 08/10/2018, 8:53 AM

## 2018-08-13 ENCOUNTER — Encounter (HOSPITAL_COMMUNITY): Payer: Self-pay | Admitting: Neurological Surgery

## 2018-08-20 DIAGNOSIS — Z125 Encounter for screening for malignant neoplasm of prostate: Secondary | ICD-10-CM | POA: Diagnosis not present

## 2018-08-20 DIAGNOSIS — E559 Vitamin D deficiency, unspecified: Secondary | ICD-10-CM | POA: Diagnosis not present

## 2018-08-20 DIAGNOSIS — R7989 Other specified abnormal findings of blood chemistry: Secondary | ICD-10-CM | POA: Diagnosis not present

## 2018-08-20 DIAGNOSIS — E781 Pure hyperglyceridemia: Secondary | ICD-10-CM | POA: Diagnosis not present

## 2018-08-20 DIAGNOSIS — R7301 Impaired fasting glucose: Secondary | ICD-10-CM | POA: Diagnosis not present

## 2018-08-24 DIAGNOSIS — R82998 Other abnormal findings in urine: Secondary | ICD-10-CM | POA: Diagnosis not present

## 2018-08-27 DIAGNOSIS — Z0000 Encounter for general adult medical examination without abnormal findings: Secondary | ICD-10-CM | POA: Diagnosis not present

## 2018-08-27 DIAGNOSIS — R3121 Asymptomatic microscopic hematuria: Secondary | ICD-10-CM | POA: Diagnosis not present

## 2018-08-27 DIAGNOSIS — N200 Calculus of kidney: Secondary | ICD-10-CM | POA: Diagnosis not present

## 2018-08-27 DIAGNOSIS — R7301 Impaired fasting glucose: Secondary | ICD-10-CM | POA: Diagnosis not present

## 2018-08-27 DIAGNOSIS — Z1331 Encounter for screening for depression: Secondary | ICD-10-CM | POA: Diagnosis not present

## 2018-08-27 DIAGNOSIS — I700 Atherosclerosis of aorta: Secondary | ICD-10-CM | POA: Diagnosis not present

## 2018-08-27 DIAGNOSIS — I480 Paroxysmal atrial fibrillation: Secondary | ICD-10-CM | POA: Diagnosis not present

## 2018-08-27 DIAGNOSIS — E781 Pure hyperglyceridemia: Secondary | ICD-10-CM | POA: Diagnosis not present

## 2018-08-27 DIAGNOSIS — M5030 Other cervical disc degeneration, unspecified cervical region: Secondary | ICD-10-CM | POA: Diagnosis not present

## 2018-08-27 DIAGNOSIS — D126 Benign neoplasm of colon, unspecified: Secondary | ICD-10-CM | POA: Diagnosis not present

## 2018-08-27 DIAGNOSIS — N529 Male erectile dysfunction, unspecified: Secondary | ICD-10-CM | POA: Diagnosis not present

## 2018-09-21 DIAGNOSIS — M5126 Other intervertebral disc displacement, lumbar region: Secondary | ICD-10-CM | POA: Diagnosis not present

## 2018-09-27 ENCOUNTER — Other Ambulatory Visit: Payer: Self-pay

## 2018-09-27 ENCOUNTER — Ambulatory Visit: Payer: PPO | Attending: Neurological Surgery | Admitting: Physical Therapy

## 2018-09-27 ENCOUNTER — Encounter: Payer: Self-pay | Admitting: Physical Therapy

## 2018-09-27 DIAGNOSIS — G8929 Other chronic pain: Secondary | ICD-10-CM | POA: Insufficient documentation

## 2018-09-27 DIAGNOSIS — M6281 Muscle weakness (generalized): Secondary | ICD-10-CM | POA: Insufficient documentation

## 2018-09-27 DIAGNOSIS — M5442 Lumbago with sciatica, left side: Secondary | ICD-10-CM | POA: Diagnosis not present

## 2018-09-27 NOTE — Therapy (Signed)
[facility] [address] [place], [place], [address] Phone: [phone]   Fax:  [phone]  Physical Therapy Evaluation  Patient Details  Name: Kevin Griffith MRN: [id number] Date of Birth: [birth year] Referring Provider (PT): Dr. Eustace Moore   Encounter Date: 09/27/2018  PT End of Session - 09/27/18 1130    Visit Number  1    Date for PT Re-Evaluation  11/22/18    Authorization Type  Health Team     PT Start Time  0820    PT Stop Time  0920    PT Time Calculation (min)  60 min    Activity Tolerance  Patient tolerated treatment well       Past Medical History:  Diagnosis Date  . Asymptomatic microscopic hematuria   . Back pain, chronic   . Cervical stenosis of spine   . Colon polyps   . DDD (degenerative disc disease), cervical   . DDD (degenerative disc disease), lumbar   . Drug rash   . ED (erectile dysfunction)   . Hyperbilirubinemia   . Hypertriglyceridemia   . IFG (impaired fasting glucose)   . Insomnia disorder   . Lactose intolerance   . Mood change   . MRSA (methicillin resistant Staphylococcus aureus)   . Palpitations   . Paroxysmal A-fib ([place])   . Skin problem   . Sleep apnea, obstructive   . Vitamin D deficiency     Past Surgical History:  Procedure Laterality Date  . APPENDECTOMY  1970  . [place], 07-2001  . HEMIDISCECECTOMY      There were no vitals filed for this visit.   Subjective Assessment - 09/27/18 0827    Subjective  Long history of LBP in 1970s.  Had discectomy in 1998 and in 2003.  Fell in 2010.    Aggravated with a fall in 2018.  Now had posterior lateral interbody fusion of L4-5 on 08/09/18.   Recent x-ray healed enough to stop wearing brace.  Lost muscle mass in calf and hip.    Least amount of pain since 2009.      Pertinent History  sleep apnea; leg length discrepancy; right shoulder surgery 2018 "working great" ;  PLIF L4-5 08/09/18    Limitations   Walking;House hold activities    How long can you sit comfortably?  2-3 hours need upright chair    How long can you walk comfortably?  1000 feet to 1 mile with cramping lower lumbar     Diagnostic tests  x-ray:  "not completely healed yet" but enough to discontinue the brace    Patient Stated Goals  get hip flexors, gluteals built back up and leg stronger while gyms are closed ;  build up lats; build up support for spine; do dead lifts; get back to gym; dead lifts     Currently in Pain?  Yes    Pain Score  2     Pain Location  Back    Pain Orientation  Left    Pain Radiating Towards  left lateral hip; left lateral lower leg     Aggravating Factors   bending over causes numbness in both feet; going up steps    Pain Relieving Factors  nothing         [facility] PT Assessment - 09/27/18 0001      Assessment   Medical Diagnosis  PLIF L4-5 fusion    Referring Provider (PT)  Dr. Eustace Moore  Onset Date/Surgical Date  08/09/18    Next MD Visit  12/23/18    Prior Therapy  for shoulder       Precautions   Precautions  --   no rotate/twist with sit ups     Restrictions   Weight Bearing Restrictions  No      Balance Screen   Has the patient fallen in the past 6 months  No    Has the patient had a decrease in activity level because of a fear of falling?   No    Is the patient reluctant to leave their home because of a fear of falling?   No      Home Film/video editor residence    Home Access  Stairs to enter    Entrance Stairs-Number of Steps  3    Home Layout  Two level    Alternate Level Stairs-Number of Steps  [place] - single point      Prior Function   Vocation  Retired    Leisure  I've been hurting so long I haven't done much ; used to play golf a long time ago      Observation/Other Assessments   Focus on Therapeutic Outcomes (FOTO)   55% limitation       Posture/Postural Control   Posture/Postural Control  Postural limitations     Postural Limitations  Decreased lumbar lordosis    Posture Comments  incision appears well healed       AROM   Overall AROM Comments  hip flexion to 90 degrees     AROM Assessment Site  --   not formally tested secondary to recent fusion     Strength   Strength Assessment Site  --   SLS pelvic drop   Right Hip Flexion  4+/5    Right Hip Extension  4+/5    Right Hip ABduction  4-/5    Left Hip Flexion  4/5    Left Hip Extension  4-/5    Left Hip ABduction  3+/5    Right Knee Flexion  5/5    Right Knee Extension  4+/5    Left Knee Flexion  4/5    Left Knee Extension  4/5    Right Ankle Dorsiflexion  5/5    Right Ankle Plantar Flexion  4/5    Right Ankle Inversion  5/5    Right Ankle Eversion  5/5    Left Ankle Dorsiflexion  4-/5    Left Ankle Plantar Flexion  3+/5    Left Ankle Eversion  4-/5    Lumbar Flexion  3/5   difficulty activation transverse abdominus   Lumbar Extension  3/5      Flexibility   Soft Tissue Assessment /Muscle Length  yes    Hamstrings  decreased bil     Quadriceps  bil hip flexors decreased   unable to reach 0 degrees on left     Slump test   Findings  Positive    Side  Left    Comment  left LBP with full knee extension/neutral ankle       Straight Leg Raise   Findings  Positive    Side   Left    Comment  40   neutral ankle     Ambulation/Gait   Gait Comments  excession ankle eversion  Objective measurements completed on examination: See above findings.              PT Education - 09/27/18 0920    Education Details   Access Code: [email or web address] hip flexor stretch on steps, abdominal brace series, clams    Person(s) Educated  Patient    Methods  Explanation;Demonstration;Handout    Comprehension  Returned demonstration;Verbalized understanding       PT Short Term Goals - 09/27/18 1148      PT SHORT TERM GOAL #1   Title  be independent in initial HEP appropriate for lumbar fusion    Time  4     Period  Weeks    Status  New    Target Date  10/25/18      PT SHORT TERM GOAL #2   Title  The patient will report a 30% improvement in back and LE pain and symptoms with usual ADLs    Time  4    Period  Weeks    Status  New      PT SHORT TERM GOAL #3   Title  The patient will have improved bil hip flexor lengths to +5 degrees bilaterally needed for ease with walking longer distances > 1 mile    Time  4    Period  Weeks    Status  New        PT Long Term Goals - 09/27/18 1151      PT LONG TERM GOAL #1   Title  be independent in advanced HEP and return to gym program     Time  8    Period  Weeks    Status  New    Target Date  11/22/18      PT LONG TERM GOAL #2   Title  reduce FOTO to < or = to 46% limitation    Time  8    Period  Weeks    Status  New      PT LONG TERM GOAL #3   Title  The patient will have at least 75% of lumbar flexion and extension ROM needed for future return to golf    Time  8    Period  Weeks    Status  New      PT LONG TERM GOAL #4   Title  The patient will have at least 4 to 4+/5 strength in bilateral LEs needed for climbing stairs and getting up off the floor    Time  8    Period  Weeks    Status  New      PT LONG TERM GOAL #5   Title  The patient will have 4+/5 core strength needed for lifting/carrying light to medium objects    Time  8    Period  Weeks    Status  New             Plan - 09/27/18 6010    Clinical Impression Statement  The patient has a long history of low back pain with 2 previous lumbar discectomies.  He had worsening of left low back pain with left LE weakness (particularly foot control) and pain requiring the use of a cane for 3-4 weeks.  He underwent posterior lateral interbody fusion of L4-5 on 08/09/18 (7 weeks ago).  He reports that until last week he was only able to walk about 1000 feet b/c of cramps but yesterday was able to walk a mile.  He states he gets  numbness in both feet with bending over and has  difficulty climbing stairs.  Lumbar ROM not formally assessed until 12 weeks post fusion.  Significantly decreased bilateral (left worse than right) hip flexor length and decreased bil HS length with left neural tension noted.  Decreased strength noted in bil hip abductors (left worse than right), knee extension and ankle plantarflexion.  Difficulty single leg standing with pelvic drop noted.  Able to contract rectus abdminus muscles but low activation of transverse abdominus muscles.  He would benefit from PT to address these deficits.      Personal Factors and Comorbidities  Age;Comorbidity 1;Comorbidity 2;Comorbidity 3+;Time since onset of injury/illness/exacerbation    Comorbidities  3 back surgeries; chronic history;  sleep apnea; multi joint degenerative changes;  shoulder surgery     Examination-Activity Limitations  Stairs;Lift;Squat;Bend;Locomotion Level    Examination-Participation Restrictions  Church;Community Activity;Other    Stability/Clinical Decision Making  Evolving/Moderate complexity    Clinical Decision Making  Moderate    Rehab Potential  Good    PT Frequency  2x / week    PT Duration  8 weeks    PT Treatment/Interventions  ADLs/Self Care Home Management;Cryotherapy;Electrical Stimulation;Moist Heat;Functional mobility training;Therapeutic activities;Therapeutic exercise;Patient/family education;Neuromuscular re-education;Manual techniques;Taping    PT Next Visit Plan  review initial HEP; add band to clams;  progress transverse abdominus ex;   add calf raises and sit to stand using hip hinge method;  low level neural gliding sciatic and femoral     PT Home Exercise Plan   Access Code: [email or web address]     Consulted and Agree with Plan of Care  Patient       Patient will benefit from skilled therapeutic intervention in order to improve the following deficits and impairments:  Pain, Difficulty walking, Decreased strength, Impaired perceived functional ability, Decreased activity  tolerance, Decreased endurance, Decreased range of motion  Visit Diagnosis: Chronic left-sided low back pain with left-sided sciatica - Plan: PT plan of care cert/re-cert  Muscle weakness (generalized) - Plan: PT plan of care cert/re-cert     Problem List There are no active problems to display for this patient.  Ruben Im, PT 09/27/18 12:01 PM Phone: [phone] Fax: [phone]  Alvera Singh 09/27/2018, 12:01 PM  [facility] [address] [place], [place], [address] Phone: [phone]   Fax:  [phone]  Name: Kevin Griffith MRN: [id number] Date of Birth: 06-12-1950

## 2018-09-27 NOTE — Patient Instructions (Signed)
Access Code: [email or web address]  URL: [email or web address]  Date: 09/27/2018  Prepared by: Ruben Im   Exercises  Hooklying Transversus Abdominis Palpation - 10 reps - 1 sets - 5 hold - 5x daily - 7x weekly  Hooklying Isometric Hip Flexion - 5 reps - 1 sets - 5 hold - 1x daily - 7x weekly  Supine Bent Leg Lift with Knee Extension - 5 reps - 1 sets - 1x daily - 7x weekly  Clamshell - 10 reps - 1 sets - 1x daily - 7x weekly  Hip Flexor Stretch on Step - 10 reps - 1 sets - 1x daily - 7x weekly

## 2018-09-29 ENCOUNTER — Encounter: Payer: Self-pay | Admitting: Physical Therapy

## 2018-09-29 ENCOUNTER — Other Ambulatory Visit: Payer: Self-pay

## 2018-09-29 ENCOUNTER — Ambulatory Visit: Payer: PPO | Admitting: Physical Therapy

## 2018-09-29 DIAGNOSIS — M6281 Muscle weakness (generalized): Secondary | ICD-10-CM

## 2018-09-29 DIAGNOSIS — G8929 Other chronic pain: Secondary | ICD-10-CM

## 2018-09-29 DIAGNOSIS — M5442 Lumbago with sciatica, left side: Secondary | ICD-10-CM | POA: Diagnosis not present

## 2018-09-29 NOTE — Therapy (Signed)
[facility] [address], [place], [place], [address] Phone: [phone]   Fax:  [phone]  Physical Therapy Treatment  Patient Details  Name: Kevin Griffith MRN: [id number] Date of Birth: 1951-01-22 Referring Provider (PT): Dr. Eustace Moore   Encounter Date: 09/29/2018  PT End of Session - 09/29/18 0904    Visit Number  2    Date for PT Re-Evaluation  11/22/18    Authorization Type  Health Team     PT Start Time  0857    PT Stop Time  0946    PT Time Calculation (min)  49 min    Activity Tolerance  Patient tolerated treatment well    Behavior During Therapy  [facility] for tasks assessed/performed       Past Medical History:  Diagnosis Date  . Asymptomatic microscopic hematuria   . Back pain, chronic   . Cervical stenosis of spine   . Colon polyps   . DDD (degenerative disc disease), cervical   . DDD (degenerative disc disease), lumbar   . Drug rash   . ED (erectile dysfunction)   . Hyperbilirubinemia   . Hypertriglyceridemia   . IFG (impaired fasting glucose)   . Insomnia disorder   . Lactose intolerance   . Mood change   . MRSA (methicillin resistant Staphylococcus aureus)   . Palpitations   . Paroxysmal A-fib ([place])   . Skin problem   . Sleep apnea, obstructive   . Vitamin D deficiency     Past Surgical History:  Procedure Laterality Date  . APPENDECTOMY  1970  . [place], 07-2001  . HEMIDISCECECTOMY      There were no vitals filed for this visit.  Subjective Assessment - 09/29/18 0906    Subjective  My hip gets really sore after my exercises.    Pertinent History  sleep apnea; leg length discrepancy; right shoulder surgery 2018 "working great" ;  PLIF L4-5 08/09/18    Currently in Pain?  --   essentialy no pain but a popping in my Lt hip is present                      [facility] Adult PT Treatment/Exercise - 09/29/18 0001      Lumbar Exercises: Stretches   Other Lumbar  Stretch Exercise  Lumbar/LE fascial mobilizations with soft balls in supine. Instructed pt hw to do at home. Verbal instruction correct demo.       Lumbar Exercises: Aerobic   Nustep  L1 x 10 min, VC for LT femoral IR   PTA present to discuss HEp and status     Lumbar Exercises: Supine   Ab Set  --   Pt could demonstrate correctly5x,    AB Set Limitations  VC to add this to his sidelying clamshell exercise.     Isometric Hip Flexion  --   Pt gets a lot of pain doing in supine along the hip joint,    Isometric Hip Flexion Limitations  Changed to sitting as this was much less painful      Lumbar Exercises: Sidelying   Clam  Left;10 reps      Manual Therapy   Soft tissue mobilization  Addaday assist to posteriolateral LT hip at end of session. Pt sidelying with pillows between knees.                PT Short Term Goals - 09/27/18 1148  PT SHORT TERM GOAL #1   Title  be independent in initial HEP appropriate for lumbar fusion    Time  4    Period  Weeks    Status  New    Target Date  10/25/18      PT SHORT TERM GOAL #2   Title  The patient will report a 30% improvement in back and LE pain and symptoms with usual ADLs    Time  4    Period  Weeks    Status  New      PT SHORT TERM GOAL #3   Title  The patient will have improved bil hip flexor lengths to +5 degrees bilaterally needed for ease with walking longer distances > 1 mile    Time  4    Period  Weeks    Status  New        PT Long Term Goals - 09/27/18 1151      PT LONG TERM GOAL #1   Title  be independent in advanced HEP and return to gym program     Time  8    Period  Weeks    Status  New    Target Date  11/22/18      PT LONG TERM GOAL #2   Title  reduce FOTO to < or = to 46% limitation    Time  8    Period  Weeks    Status  New      PT LONG TERM GOAL #3   Title  The patient will have at least 75% of lumbar flexion and extension ROM needed for future return to golf    Time  8    Period   Weeks    Status  New      PT LONG TERM GOAL #4   Title  The patient will have at least 4 to 4+/5 strength in bilateral LEs needed for climbing stairs and getting up off the floor    Time  8    Period  Weeks    Status  New      PT LONG TERM GOAL #5   Title  The patient will have 4+/5 core strength needed for lifting/carrying light to medium objects    Time  8    Period  Weeks    Status  New            Plan - 09/29/18 0904    Clinical Impression Statement  Pt arrives to PT today with no complaints of pain, only a "popping" along his lateral Lt hip. He reports that he gets pretty sore from his exercises at home. We modified his isometric hip flexion from supine to sitting as this was much less painful to the hip. He has a difficult time controling his LT hip. PTA educated pt in home fascial mobilization of his lumbopelvic area using soft balls. He felt this increased his mobility in his hip greatly.     Personal Factors and Comorbidities  Age;Comorbidity 1;Comorbidity 2;Comorbidity 3+;Time since onset of injury/illness/exacerbation    Comorbidities  3 back surgeries; chronic history;  sleep apnea; multi joint degenerative changes;  shoulder surgery     Examination-Activity Limitations  Stairs;Lift;Squat;Bend;Locomotion Level    Examination-Participation Restrictions  Church;Community Activity;Other    Stability/Clinical Decision Making  Evolving/Moderate complexity    Rehab Potential  Good    PT Frequency  2x / week    PT Duration  8 weeks    PT  Treatment/Interventions  ADLs/Self Care Home Management;Cryotherapy;Electrical Stimulation;Moist Heat;Functional mobility training;Therapeutic activities;Therapeutic exercise;Patient/family education;Neuromuscular re-education;Manual techniques;Taping    PT Next Visit Plan  Follow up with pt on home fascial mobilization exercise, add band to clams, sit to stand ( elevated) Nustep    PT Home Exercise Plan   Access Code: [email or web address]     Consulted  and Agree with Plan of Care  Patient       Patient will benefit from skilled therapeutic intervention in order to improve the following deficits and impairments:  Pain, Difficulty walking, Decreased strength, Impaired perceived functional ability, Decreased activity tolerance, Decreased endurance, Decreased range of motion  Visit Diagnosis: Chronic left-sided low back pain with left-sided sciatica  Muscle weakness (generalized)     Problem List There are no active problems to display for this patient.   Eldoris Beiser, PTA 09/29/2018, 10:07 AM  [facility] [address], [place], [place], [address] Phone: [phone]   Fax:  [phone]  Name: MANDRELL VANGILDER MRN: [id number] Date of Birth: 10-08-1950

## 2018-10-04 ENCOUNTER — Other Ambulatory Visit: Payer: Self-pay

## 2018-10-04 ENCOUNTER — Encounter: Payer: Self-pay | Admitting: Physical Therapy

## 2018-10-04 ENCOUNTER — Ambulatory Visit: Payer: PPO | Admitting: Physical Therapy

## 2018-10-04 DIAGNOSIS — M5442 Lumbago with sciatica, left side: Secondary | ICD-10-CM | POA: Diagnosis not present

## 2018-10-04 DIAGNOSIS — G8929 Other chronic pain: Secondary | ICD-10-CM

## 2018-10-04 DIAGNOSIS — M6281 Muscle weakness (generalized): Secondary | ICD-10-CM

## 2018-10-04 NOTE — Patient Instructions (Signed)
Access Code: [email or web address]  URL: [email or web address]  Date: 10/04/2018  Prepared by: Ruben Im   Exercises  Hooklying Transversus Abdominis Palpation - 10 reps - 1 sets - 5 hold - 5x daily - 7x weekly  Hooklying Isometric Hip Flexion - 5 reps - 1 sets - 5 hold - 1x daily - 7x weekly  Supine Bent Leg Lift with Knee Extension - 5 reps - 1 sets - 1x daily - 7x weekly  Clamshell - 10 reps - 1 sets - 1x daily - 7x weekly  Hip Flexor Stretch on Step - 10 reps - 1 sets - 1x daily - 7x weekly  Side Stepping with Resistance at Thighs - 10 reps - 1 sets - 1x daily - 7x weekly  Diagonal Hip Extension with Resistance - 10 reps - 1 sets - 1x daily - 7x weekly  Sit to Stand with Arms Crossed - 10 reps - 1 sets - 1x daily - 7x weekly

## 2018-10-04 NOTE — Therapy (Signed)
[facility] [address], [place], [place], [address] Phone: [phone]   Fax:  [phone]  Physical Therapy Treatment  Patient Details  Name: Kevin Griffith MRN: [id number] Date of Birth: [birth year] Referring Provider (PT): Dr. Eustace Moore   Encounter Date: 10/04/2018  PT End of Session - 10/04/18 1008    Visit Number  3    Date for PT Re-Evaluation  11/22/18    Authorization Type  Health Team     PT Start Time  [phone]    PT Stop Time  1012    PT Time Calculation (min)  43 min    Activity Tolerance  Patient tolerated treatment well       Past Medical History:  Diagnosis Date  . Asymptomatic microscopic hematuria   . Back pain, chronic   . Cervical stenosis of spine   . Colon polyps   . DDD (degenerative disc disease), cervical   . DDD (degenerative disc disease), lumbar   . Drug rash   . ED (erectile dysfunction)   . Hyperbilirubinemia   . Hypertriglyceridemia   . IFG (impaired fasting glucose)   . Insomnia disorder   . Lactose intolerance   . Mood change   . MRSA (methicillin resistant Staphylococcus aureus)   . Palpitations   . Paroxysmal A-fib ([place])   . Skin problem   . Sleep apnea, obstructive   . Vitamin D deficiency     Past Surgical History:  Procedure Laterality Date  . APPENDECTOMY  1970  . [place], 07-2001  . HEMIDISCECECTOMY      There were no vitals filed for this visit.  Subjective Assessment - 10/04/18 0931    Subjective  I walked a mile and my toes cramped badly.  Pain in back left hip.  Hard to ascend step with left foot.  Those small balls the therapist told me about last time  worked great.   It feels like I have a shin splint.  I'm going to [facility] to get an oxygen injection in my hip.      Pertinent History  sleep apnea; leg length discrepancy; right shoulder surgery 2018 "working great" ;  PLIF L4-5 08/09/18    Currently in Pain?  Yes    Pain Score  2     Pain  Location  Hip    Pain Orientation  Left                       [facility] Adult PT Treatment/Exercise - 10/04/18 0001      Lumbar Exercises: Stretches   Other Lumbar Stretch Exercise  2nd step stretch 5x right/left     Other Lumbar Stretch Exercise  doorway stretch with UEs added      Lumbar Exercises: Aerobic   Nustep  L2 10 min seat 13 (part of time with arms)   while discussing status     Lumbar Exercises: Machines for Strengthening   Leg Press  110# seat 9 with back reclined ; 40# left only 15x       Lumbar Exercises: Standing   Wall Slides Limitations  45# resisted backward walking 15x     Shoulder Adduction Limitations  hip flexion with UE slides on wall 10x     Other Standing Lumbar Exercises  green band hip abduction and extension 15x each     Other Standing Lumbar Exercises  side stepping green band around thighs 8x  Lumbar Exercises: Seated   Sit to Stand  20 reps    Sit to Stand Limitations  high mat table              PT Education - 10/04/18 1014    Education Details   Access Code: [email or web address]  green band side step, green band hip extension, sit to stand from elevated surface    Person(s) Educated  Patient    Methods  Explanation;Demonstration;Handout    Comprehension  Returned demonstration;Verbalized understanding       PT Short Term Goals - 09/27/18 1148      PT SHORT TERM GOAL #1   Title  be independent in initial HEP appropriate for lumbar fusion    Time  4    Period  Weeks    Status  New    Target Date  10/25/18      PT SHORT TERM GOAL #2   Title  The patient will report a 30% improvement in back and LE pain and symptoms with usual ADLs    Time  4    Period  Weeks    Status  New      PT SHORT TERM GOAL #3   Title  The patient will have improved bil hip flexor lengths to +5 degrees bilaterally needed for ease with walking longer distances > 1 mile    Time  4    Period  Weeks    Status  New        PT Long Term Goals -  09/27/18 1151      PT LONG TERM GOAL #1   Title  be independent in advanced HEP and return to gym program     Time  8    Period  Weeks    Status  New    Target Date  11/22/18      PT LONG TERM GOAL #2   Title  reduce FOTO to < or = to 46% limitation    Time  8    Period  Weeks    Status  New      PT LONG TERM GOAL #3   Title  The patient will have at least 75% of lumbar flexion and extension ROM needed for future return to golf    Time  8    Period  Weeks    Status  New      PT LONG TERM GOAL #4   Title  The patient will have at least 4 to 4+/5 strength in bilateral LEs needed for climbing stairs and getting up off the floor    Time  8    Period  Weeks    Status  New      PT LONG TERM GOAL #5   Title  The patient will have 4+/5 core strength needed for lifting/carrying light to medium objects    Time  8    Period  Weeks    Status  New            Plan - 10/04/18 1022    Clinical Impression Statement  The patient arrives with reports of low intensity pain.  He is able to perform a progression of lumbo/pelvic/hip strengthening exercises with close supervision and modifications made for pain, hip popping and muscle fatigue.  The patient needs to be closely monitored as he tends to do more reps than were prescribed and he will increase weight plates on his own.  He changed leg press weight from 70#  to 150# and noted his left hip kept popping and his pain level increased.  He was persuaded to decrease weight and compromised to 110#.  He reports overall he feels looser and feels better post treatment session but states he will be "drinking the pickle juice" later today for muscle cramps.      Rehab Potential  Good    PT Frequency  2x / week    PT Duration  8 weeks    PT Treatment/Interventions  ADLs/Self Care Home Management;Cryotherapy;Electrical Stimulation;Moist Heat;Functional mobility training;Therapeutic activities;Therapeutic exercise;Patient/family  education;Neuromuscular re-education;Manual techniques;Taping    PT Next Visit Plan  assess response to progression of strengthening, sit to stand ( elevated) Nustep    PT Home Exercise Plan   Access Code: [email or web address]        Patient will benefit from skilled therapeutic intervention in order to improve the following deficits and impairments:  Pain, Difficulty walking, Decreased strength, Impaired perceived functional ability, Decreased activity tolerance, Decreased endurance, Decreased range of motion  Visit Diagnosis: Chronic left-sided low back pain with left-sided sciatica  Muscle weakness (generalized)     Problem List There are no active problems to display for this patient.  Ruben Im, PT 10/04/18 10:28 AM Phone: [phone] Fax: [phone] Alvera Singh 10/04/2018, 10:28 AM  [facility] [address] [place], [place], [address] Phone: [phone]   Fax:  [phone]  Name: JAMARRION BUDAI MRN: [id number] Date of Birth: 1951-04-09

## 2018-10-07 ENCOUNTER — Encounter: Payer: Self-pay | Admitting: Physical Therapy

## 2018-10-07 ENCOUNTER — Ambulatory Visit: Payer: PPO | Admitting: Physical Therapy

## 2018-10-07 ENCOUNTER — Other Ambulatory Visit: Payer: Self-pay

## 2018-10-07 DIAGNOSIS — G8929 Other chronic pain: Secondary | ICD-10-CM

## 2018-10-07 DIAGNOSIS — M6281 Muscle weakness (generalized): Secondary | ICD-10-CM

## 2018-10-07 DIAGNOSIS — M5442 Lumbago with sciatica, left side: Secondary | ICD-10-CM | POA: Diagnosis not present

## 2018-10-07 NOTE — Therapy (Signed)
[facility] [address] [place], [place], [address] Phone: [phone]   Fax:  [phone]  Physical Therapy Treatment  Patient Details  Name: Kevin Griffith MRN: [id number] Date of Birth: May 01, 1950 Referring Provider (PT): Dr. Eustace Moore   Encounter Date: 10/07/2018  PT End of Session - 10/07/18 1326    Visit Number  4    Date for PT Re-Evaluation  11/22/18    Authorization Type  Health Team     PT Start Time  1326    PT Stop Time  1415    PT Time Calculation (min)  49 min    Activity Tolerance  Patient tolerated treatment well       Past Medical History:  Diagnosis Date  . Asymptomatic microscopic hematuria   . Back pain, chronic   . Cervical stenosis of spine   . Colon polyps   . DDD (degenerative disc disease), cervical   . DDD (degenerative disc disease), lumbar   . Drug rash   . ED (erectile dysfunction)   . Hyperbilirubinemia   . Hypertriglyceridemia   . IFG (impaired fasting glucose)   . Insomnia disorder   . Lactose intolerance   . Mood change   . MRSA (methicillin resistant Staphylococcus aureus)   . Palpitations   . Paroxysmal A-fib ([place])   . Skin problem   . Sleep apnea, obstructive   . Vitamin D deficiency     Past Surgical History:  Procedure Laterality Date  . APPENDECTOMY  1970  . [place], 07-2001  . HEMIDISCECECTOMY      There were no vitals filed for this visit.  Subjective Assessment - 10/07/18 1326    Subjective  I'm sad b/c there is a treatment using vitamins and minerals to treat COVID and it has been denied being published.  My pain level is a 0 today.  The other day after PT I had some cramping and put on TENS and went to sleep.  No cramping when I woke up.  I walked up and down steps 3x and heel raises off the step.  The leg press helped that left low back spot "find it's home."    Pertinent History  sleep apnea; leg length discrepancy; right shoulder  surgery 2018 "working great" ;  PLIF L4-5 08/09/18    Patient Stated Goals  get hip flexors, gluteals built back up and leg stronger while gyms are closed ;  build up lats; build up support for spine; do dead lifts; get back to gym; dead lifts     Currently in Pain?  No/denies    Pain Score  0-No pain                       [facility] Adult PT Treatment/Exercise - 10/07/18 0001      Therapeutic Activites    ADL's  hip hinge 30 degrees with cane behind the back and maintaining 3 points of contact      Lumbar Exercises: Stretches   Active Hamstring Stretch  Right;Left;5 reps    Active Hamstring Stretch Limitations  seated with hip hinge     Other Lumbar Stretch Exercise  2nd step stretch 5x right/left  with UE reaches       Lumbar Exercises: Aerobic   Nustep  L2 10 min seat 13 (part of time with arms)   while discussing status     Lumbar Exercises: Machines for Strengthening  Leg Press  50# single leg seat 9 reclined seat 2x 10      Lumbar Exercises: Standing   Other Standing Lumbar Exercises  red band hip extension with red band 12x right/left     Other Standing Lumbar Exercises  red band hip abduction 15x right and left       Lumbar Exercises: Seated   Sit to Stand  10 reps   3 sets    Sit to Stand Limitations  high mat table holding 10#       Lumbar Exercises: Supine   Bridge  10 reps    Single Leg Bridge  10 reps      Lumbar Exercises: Sidelying   Other Sidelying Lumbar Exercises  side planks 4x   with no rotation      Lumbar Exercises: Quadruped   Straight Leg Raise  5 reps    Straight Leg Raises Limitations  cues for extending straight back and not up              PT Education - 10/07/18 1417    Education Details  education of graded movement using "burned microwave popcorn" story to avoid overdoing it    Person(s) Educated  Patient    Methods  Explanation    Comprehension  Verbalized understanding       PT Short Term Goals - 09/27/18 1148       PT SHORT TERM GOAL #1   Title  be independent in initial HEP appropriate for lumbar fusion    Time  4    Period  Weeks    Status  New    Target Date  10/25/18      PT SHORT TERM GOAL #2   Title  The patient will report a 30% improvement in back and LE pain and symptoms with usual ADLs    Time  4    Period  Weeks    Status  New      PT SHORT TERM GOAL #3   Title  The patient will have improved bil hip flexor lengths to +5 degrees bilaterally needed for ease with walking longer distances > 1 mile    Time  4    Period  Weeks    Status  New        PT Long Term Goals - 09/27/18 1151      PT LONG TERM GOAL #1   Title  be independent in advanced HEP and return to gym program     Time  8    Period  Weeks    Status  New    Target Date  11/22/18      PT LONG TERM GOAL #2   Title  reduce FOTO to < or = to 46% limitation    Time  8    Period  Weeks    Status  New      PT LONG TERM GOAL #3   Title  The patient will have at least 75% of lumbar flexion and extension ROM needed for future return to golf    Time  8    Period  Weeks    Status  New      PT LONG TERM GOAL #4   Title  The patient will have at least 4 to 4+/5 strength in bilateral LEs needed for climbing stairs and getting up off the floor    Time  8    Period  Weeks    Status  New  PT LONG TERM GOAL #5   Title  The patient will have 4+/5 core strength needed for lifting/carrying light to medium objects    Time  8    Period  Weeks    Status  New            Plan - 10/07/18 1333    Clinical Impression Statement  The patient reports is eager to begin more advanced exercises that he used to do including single leg bridges with foot on stool, side planks with added rotation and kneeling to prone hamstring workouts.  Discussed with patient more of a graded approach and precautions with these exercises including no exercises that include spinal rotation.  Therapist closely monitoring form and symptoms  response with all interventions.  He reports some hip popping with standing band ex's but otherwise no difficulty reported.    Personal Factors and Comorbidities  Age;Comorbidity 1;Comorbidity 2;Comorbidity 3+;Time since onset of injury/illness/exacerbation    Comorbidities  3 back surgeries; chronic history;  sleep apnea; multi joint degenerative changes;  shoulder surgery     Rehab Potential  Good    PT Frequency  2x / week    PT Duration  8 weeks    PT Treatment/Interventions  ADLs/Self Care Home Management;Cryotherapy;Electrical Stimulation;Moist Heat;Functional mobility training;Therapeutic activities;Therapeutic exercise;Patient/family education;Neuromuscular re-education;Manual techniques;Taping    PT Next Visit Plan  assess response to progression of strengthening, sit to stand with weight;   Nustep;  dead lifts without weight    PT Home Exercise Plan   Access Code: [email or web address]        Patient will benefit from skilled therapeutic intervention in order to improve the following deficits and impairments:  Pain, Difficulty walking, Decreased strength, Impaired perceived functional ability, Decreased activity tolerance, Decreased endurance, Decreased range of motion  Visit Diagnosis: Chronic left-sided low back pain with left-sided sciatica  Muscle weakness (generalized)     Problem List There are no active problems to display for this patient.  Ruben Im, PT 10/07/18 2:26 PM Phone: [phone] Fax: [phone]  Alvera Singh 10/07/2018, 2:25 PM  [facility] [address], [place], [place], [address] Phone: [phone]   Fax:  [phone]  Name: Kevin Griffith MRN: [id number] Date of Birth: [birth year]

## 2018-10-11 ENCOUNTER — Other Ambulatory Visit: Payer: Self-pay

## 2018-10-11 ENCOUNTER — Ambulatory Visit: Payer: PPO | Admitting: Physical Therapy

## 2018-10-11 ENCOUNTER — Encounter: Payer: Self-pay | Admitting: Physical Therapy

## 2018-10-11 DIAGNOSIS — G8929 Other chronic pain: Secondary | ICD-10-CM

## 2018-10-11 DIAGNOSIS — M5442 Lumbago with sciatica, left side: Secondary | ICD-10-CM | POA: Diagnosis not present

## 2018-10-11 DIAGNOSIS — M6281 Muscle weakness (generalized): Secondary | ICD-10-CM

## 2018-10-11 NOTE — Therapy (Signed)
[facility] [address] [place], [place], [address] Phone: [phone]   Fax:  [phone]  Physical Therapy Treatment  Patient Details  Name: Kevin Griffith MRN: [id number] Date of Birth: 18-Jan-1951 Referring Provider (PT): Dr. Eustace Moore   Encounter Date: 10/11/2018  PT End of Session - 10/11/18 1034    Visit Number  5    Date for PT Re-Evaluation  11/22/18    Authorization Type  Health Team     PT Start Time  1027    PT Stop Time  1115    PT Time Calculation (min)  48 min    Activity Tolerance  Patient tolerated treatment well       Past Medical History:  Diagnosis Date  . Asymptomatic microscopic hematuria   . Back pain, chronic   . Cervical stenosis of spine   . Colon polyps   . DDD (degenerative disc disease), cervical   . DDD (degenerative disc disease), lumbar   . Drug rash   . ED (erectile dysfunction)   . Hyperbilirubinemia   . Hypertriglyceridemia   . IFG (impaired fasting glucose)   . Insomnia disorder   . Lactose intolerance   . Mood change   . MRSA (methicillin resistant Staphylococcus aureus)   . Palpitations   . Paroxysmal A-fib ([place])   . Skin problem   . Sleep apnea, obstructive   . Vitamin D deficiency     Past Surgical History:  Procedure Laterality Date  . APPENDECTOMY  1970  . [place], 07-2001  . HEMIDISCECECTOMY      There were no vitals filed for this visit.  Subjective Assessment - 10/11/18 1027    Subjective  I wasn't hurting until this (Nu-Step).  I think it's b/c that lat muscle is still healing.   When I suck in that transverse muscle and it makes it feel better.  I wasn't sore from anything we did here but I did something on my own and I was cramping later.  I did the steps 3x.  Sit to stand exercise helps with a medicine ball.  My legs are getting stronger.    Pertinent History  sleep apnea; leg length discrepancy; right shoulder surgery 2018 "working  great" ;  PLIF L4-5 08/09/18    How long can you sit comfortably?  2-3 hours need upright chair    How long can you walk comfortably?  1000 feet to 1 mile with cramping lower lumbar     Diagnostic tests  x-ray:  "not completely healed yet" but enough to discontinue the brace    Patient Stated Goals  get hip flexors, gluteals built back up and leg stronger while gyms are closed ;  build up lats; build up support for spine; do dead lifts; get back to gym; dead lifts     Currently in Pain?  No/denies    Pain Score  0-No pain    Pain Location  Back    Pain Orientation  Left                       [facility] Adult PT Treatment/Exercise - 10/11/18 0001      Lumbar Exercises: Stretches   Other Lumbar Stretch Exercise  2nd step stretch 5x right/left  with UE reaches       Lumbar Exercises: Aerobic   Nustep  L2 10 minutes       Lumbar Exercises: Machines for Strengthening  Cybex Knee Extension  17# 20x  bil     Cybex Knee Flexion  22# 20x bil       Lumbar Exercises: Standing   Wall Slides Limitations  55# resisted backward walking 15x holding handles    Other Standing Lumbar Exercises  side step resisted walking 25# 5x each     Other Standing Lumbar Exercises  glute med stand tall right/left 10x       Knee/Hip Exercises: Standing   Other Standing Knee Exercises  hip extension isometric 5x 5 sec hold right/left   against wall    Other Standing Knee Exercises  hip hinge to knees no weight 10x               PT Short Term Goals - 09/27/18 1148      PT SHORT TERM GOAL #1   Title  be independent in initial HEP appropriate for lumbar fusion    Time  4    Period  Weeks    Status  New    Target Date  10/25/18      PT SHORT TERM GOAL #2   Title  The patient will report a 30% improvement in back and LE pain and symptoms with usual ADLs    Time  4    Period  Weeks    Status  New      PT SHORT TERM GOAL #3   Title  The patient will have improved bil hip flexor lengths  to +5 degrees bilaterally needed for ease with walking longer distances > 1 mile    Time  4    Period  Weeks    Status  New        PT Long Term Goals - 09/27/18 1151      PT LONG TERM GOAL #1   Title  be independent in advanced HEP and return to gym program     Time  8    Period  Weeks    Status  New    Target Date  11/22/18      PT LONG TERM GOAL #2   Title  reduce FOTO to < or = to 46% limitation    Time  8    Period  Weeks    Status  New      PT LONG TERM GOAL #3   Title  The patient will have at least 75% of lumbar flexion and extension ROM needed for future return to golf    Time  8    Period  Weeks    Status  New      PT LONG TERM GOAL #4   Title  The patient will have at least 4 to 4+/5 strength in bilateral LEs needed for climbing stairs and getting up off the floor    Time  8    Period  Weeks    Status  New      PT LONG TERM GOAL #5   Title  The patient will have 4+/5 core strength needed for lifting/carrying light to medium objects    Time  8    Period  Weeks    Status  New            Plan - 10/11/18 1121    Clinical Impression Statement  The patient continues to have left > right gluteal weakness.  Pelvic drop and lateral trunk lean noted with ambulation.  Treatment focus on hip strengthening and avoiding pelvic drop with single limb  stance.  Moderate verbal cues for hip hinge to avoid spinal flexion.  The patient continues to be highly motivated and more receptive to "not overdoing it" today.    Personal Factors and Comorbidities  Age;Comorbidity 1;Comorbidity 2;Comorbidity 3+;Time since onset of injury/illness/exacerbation    Comorbidities  3 back surgeries; chronic history;  sleep apnea; multi joint degenerative changes;  shoulder surgery     Rehab Potential  Good    PT Frequency  2x / week    PT Treatment/Interventions  ADLs/Self Care Home Management;Cryotherapy;Electrical Stimulation;Moist Heat;Functional mobility training;Therapeutic  activities;Therapeutic exercise;Patient/family education;Neuromuscular re-education;Manual techniques;Taping    PT Next Visit Plan  assess response to progression of strengthening especially hip;   Nustep;  dead lifts without weight    PT Home Exercise Plan   Access Code: [email or web address]        Patient will benefit from skilled therapeutic intervention in order to improve the following deficits and impairments:  Pain, Difficulty walking, Decreased strength, Impaired perceived functional ability, Decreased activity tolerance, Decreased endurance, Decreased range of motion  Visit Diagnosis: Chronic left-sided low back pain with left-sided sciatica  Muscle weakness (generalized)     Problem List There are no active problems to display for this patient.  Ruben Im, PT 10/11/18 11:27 AM Phone: [phone] Fax: [phone] Alvera Singh 10/11/2018, 11:26 AM  [facility] [address], [place], [place], [address] Phone: [phone]   Fax:  [phone]  Name: ALONTE WULFF MRN: [id number] Date of Birth: 15-Nov-1950

## 2018-10-13 ENCOUNTER — Encounter: Payer: PPO | Admitting: Physical Therapy

## 2018-10-14 ENCOUNTER — Ambulatory Visit: Payer: PPO | Admitting: Physical Therapy

## 2018-10-14 ENCOUNTER — Encounter: Payer: Self-pay | Admitting: Physical Therapy

## 2018-10-14 ENCOUNTER — Other Ambulatory Visit: Payer: Self-pay

## 2018-10-14 DIAGNOSIS — M5442 Lumbago with sciatica, left side: Secondary | ICD-10-CM | POA: Diagnosis not present

## 2018-10-14 DIAGNOSIS — G8929 Other chronic pain: Secondary | ICD-10-CM

## 2018-10-14 DIAGNOSIS — M6281 Muscle weakness (generalized): Secondary | ICD-10-CM

## 2018-10-14 NOTE — Therapy (Signed)
[facility] [address], [place], [place], [address] Phone: [phone]   Fax:  [phone]  Physical Therapy Treatment  Patient Details  Name: Kevin Griffith MRN: [id number] Date of Birth: 1950-06-28 Referring Provider (PT): Dr. Eustace Moore   Encounter Date: 10/14/2018  PT End of Session - 10/14/18 0829    Visit Number  6    Date for PT Re-Evaluation  11/22/18    Authorization Type  Health Team     PT Start Time  [phone]    PT Stop Time  0905    PT Time Calculation (min)  44 min    Activity Tolerance  Patient tolerated treatment well       Past Medical History:  Diagnosis Date  . Asymptomatic microscopic hematuria   . Back pain, chronic   . Cervical stenosis of spine   . Colon polyps   . DDD (degenerative disc disease), cervical   . DDD (degenerative disc disease), lumbar   . Drug rash   . ED (erectile dysfunction)   . Hyperbilirubinemia   . Hypertriglyceridemia   . IFG (impaired fasting glucose)   . Insomnia disorder   . Lactose intolerance   . Mood change   . MRSA (methicillin resistant Staphylococcus aureus)   . Palpitations   . Paroxysmal A-fib ([place])   . Skin problem   . Sleep apnea, obstructive   . Vitamin D deficiency     Past Surgical History:  Procedure Laterality Date  . APPENDECTOMY  1970  . [place], 07-2001  . HEMIDISCECECTOMY      There were no vitals filed for this visit.  Subjective Assessment - 10/14/18 0821    Subjective  Patient arrives 15 min early stating I wanted to get warmed up.  Reports he was really sore Monday from that "nerve root getting flared up".  Thinks it may be from hamstring curls with resistance.  Hasn't been able to walk b/c of the rain.  Stiff from sitting all day yesterday.  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  I'm stressed and haven't slept well last 2 nights.    Pertinent History  sleep apnea; leg length  discrepancy; right shoulder surgery 2018 "working great" ;  PLIF L4-5 08/09/18    How long can you walk comfortably?  1000 feet to 1 mile with cramping lower lumbar     Patient Stated Goals  get hip flexors, gluteals built back up and leg stronger while gyms are closed ;  build up lats; build up support for spine; do dead lifts; get back to gym; dead lifts     Currently in Pain?  Yes    Pain Score  4     Pain Location  Back    Pain Orientation  Lower    Pain Type  Chronic pain                       [facility] Adult PT Treatment/Exercise - 10/14/18 0001      Lumbar Exercises: Stretches   Other Lumbar Stretch Exercise  supine decompression series:  6-8 times    painful on left so focused more on right LE whole leg press      Lumbar Exercises: Aerobic   Nustep  L4 20 min    while discussing status and response to treatment; education     Lumbar Exercises: Supine  Other Supine Lumbar Exercises  green ball roll up and down 30x oscillation     Other Supine Lumbar Exercises  lying supine with soft green balls for fascial mobility       Lumbar Exercises: Sidelying   Clam  Left;5 reps    Clam Limitations  "feels good"      Manual Therapy   Soft tissue mobilization  Addaday assist to posteriolateral LT hip at end of session. Pt sidelying with pillows between knees; yellow attachment x 8 min                PT Short Term Goals - 09/27/18 1148      PT SHORT TERM GOAL #1   Title  be independent in initial HEP appropriate for lumbar fusion    Time  4    Period  Weeks    Status  New    Target Date  10/25/18      PT SHORT TERM GOAL #2   Title  The patient will report a 30% improvement in back and LE pain and symptoms with usual ADLs    Time  4    Period  Weeks    Status  New      PT SHORT TERM GOAL #3   Title  The patient will have improved bil hip flexor lengths to +5 degrees bilaterally needed for ease with walking longer distances > 1 mile    Time  4    Period   Weeks    Status  New        PT Long Term Goals - 09/27/18 1151      PT LONG TERM GOAL #1   Title  be independent in advanced HEP and return to gym program     Time  8    Period  Weeks    Status  New    Target Date  11/22/18      PT LONG TERM GOAL #2   Title  reduce FOTO to < or = to 46% limitation    Time  8    Period  Weeks    Status  New      PT LONG TERM GOAL #3   Title  The patient will have at least 75% of lumbar flexion and extension ROM needed for future return to golf    Time  8    Period  Weeks    Status  New      PT LONG TERM GOAL #4   Title  The patient will have at least 4 to 4+/5 strength in bilateral LEs needed for climbing stairs and getting up off the floor    Time  8    Period  Weeks    Status  New      PT LONG TERM GOAL #5   Title  The patient will have 4+/5 core strength needed for lifting/carrying light to medium objects    Time  8    Period  Weeks    Status  New            Plan - 10/14/18 0916    Clinical Impression Statement  The patient patient reports a flare up of left lumbar and posterior hip pain today which he attributes to HS pull downs last visit but agrees it could also be the result of increased stress factors the last few days, prolonged sitting, lack of his usual walk b/c of rainy weather and limited sleep.  He participates in  low level oscillation and decompression ex's with some relief and further relief with instrument assisted soft tissue mobilization.  Therapist closely monitoring response with all treatment interventions with modifications as needed for pain    Comorbidities  3 back surgeries; chronic history;  sleep apnea; multi joint degenerative changes;  shoulder surgery     Rehab Potential  Good    PT Frequency  2x / week    PT Duration  8 weeks    PT Treatment/Interventions  ADLs/Self Care Home Management;Cryotherapy;Electrical Stimulation;Moist Heat;Functional mobility training;Therapeutic activities;Therapeutic  exercise;Patient/family education;Neuromuscular re-education;Manual techniques;Taping    PT Next Visit Plan  Nu-Step; clams; instrument assisted myofascial work as needed;  return to ex as able    PT Home Exercise Plan   Access Code: [email or web address]        Patient will benefit from skilled therapeutic intervention in order to improve the following deficits and impairments:  Pain, Difficulty walking, Decreased strength, Impaired perceived functional ability, Decreased activity tolerance, Decreased endurance, Decreased range of motion  Visit Diagnosis: 1. Chronic left-sided low back pain with left-sided sciatica   2. Muscle weakness (generalized)        Problem List There are no active problems to display for this patient.  Ruben Im, PT 10/14/18 9:26 AM Phone: [phone] Fax: [phone] Alvera Singh 10/14/2018, 9:25 AM  [facility] [address] [place], [place], [address] Phone: [phone]   Fax:  [phone]  Name: PLACIDO HANGARTNER MRN: [id number] Date of Birth: 1950/06/22

## 2018-10-18 ENCOUNTER — Ambulatory Visit: Payer: PPO | Admitting: Physical Therapy

## 2018-10-18 ENCOUNTER — Other Ambulatory Visit: Payer: Self-pay

## 2018-10-18 DIAGNOSIS — M5442 Lumbago with sciatica, left side: Secondary | ICD-10-CM

## 2018-10-18 DIAGNOSIS — G8929 Other chronic pain: Secondary | ICD-10-CM

## 2018-10-18 DIAGNOSIS — M6281 Muscle weakness (generalized): Secondary | ICD-10-CM

## 2018-10-18 NOTE — Therapy (Signed)
[facility] [address] [place], [place], [address] Phone: [phone]   Fax:  [phone]  Physical Therapy Treatment  Patient Details  Name: Kevin Griffith MRN: [id number] Date of Birth: 1950-12-01 Referring Provider (PT): Dr. Eustace Moore   Encounter Date: 10/18/2018  PT End of Session - 10/18/18 1057    Visit Number  7    Date for PT Re-Evaluation  11/22/18    Authorization Type  Health Team     PT Start Time  1025    PT Stop Time  1105    PT Time Calculation (min)  40 min    Activity Tolerance  Patient tolerated treatment well       Past Medical History:  Diagnosis Date  . Asymptomatic microscopic hematuria   . Back pain, chronic   . Cervical stenosis of spine   . Colon polyps   . DDD (degenerative disc disease), cervical   . DDD (degenerative disc disease), lumbar   . Drug rash   . ED (erectile dysfunction)   . Hyperbilirubinemia   . Hypertriglyceridemia   . IFG (impaired fasting glucose)   . Insomnia disorder   . Lactose intolerance   . Mood change   . MRSA (methicillin resistant Staphylococcus aureus)   . Palpitations   . Paroxysmal A-fib ([place])   . Skin problem   . Sleep apnea, obstructive   . Vitamin D deficiency     Past Surgical History:  Procedure Laterality Date  . APPENDECTOMY  1970  . [place], 07-2001  . HEMIDISCECECTOMY      There were no vitals filed for this visit.  Subjective Assessment - 10/18/18 1025    Subjective  That nerve root was flared up Thursday and I got it calmed down on Friday.  I walked on Saturday 1000 ft loop and then it flared up on Sunday.  I think it's caught up in those sutures and it won't get freed up until Aug 1st.  I think the hip  muscles have gotten stronger b/c it doesn't hip pop anymore.  My back feels better overall and I think the fusion is healing. I don't have to lift my leg today to get on/off the Nu-Step.    Pertinent History  sleep  apnea; leg length discrepancy; right shoulder surgery 2018 "working great" ;  PLIF L4-5 08/09/18    Patient Stated Goals  get hip flexors, gluteals built back up and leg stronger while gyms are closed ;  build up lats; build up support for spine; do dead lifts; get back to gym; dead lifts     Currently in Pain?  Yes    Pain Score  2     Pain Location  Back    Pain Orientation  Lower    Pain Type  Surgical pain                       [facility] Adult PT Treatment/Exercise - 10/18/18 0001      Lumbar Exercises: Stretches   Single Knee to Chest Stretch  Right;Left;2 reps;30 seconds    Piriformis Stretch  Right;Left;2 reps;20 seconds    Other Lumbar Stretch Exercise  2nd step stretch 25 right/left without         Lumbar Exercises: Aerobic   Nustep  L2 10 min       Lumbar Exercises: Standing   Heel Raises  20 reps    Other Standing  Lumbar Exercises  weight shifting side to side 25x     Other Standing Lumbar Exercises  retro stepping with blue band handle resistance 25x       Knee/Hip Exercises: Standing   Forward Step Up  Right;Left;20 reps;Hand Hold: 2;Step Height: 6"    Forward Step Up Limitations  45 sec each leg               PT Short Term Goals - 09/27/18 1148      PT SHORT TERM GOAL #1   Title  be independent in initial HEP appropriate for lumbar fusion    Time  4    Period  Weeks    Status  New    Target Date  10/25/18      PT SHORT TERM GOAL #2   Title  The patient will report a 30% improvement in back and LE pain and symptoms with usual ADLs    Time  4    Period  Weeks    Status  New      PT SHORT TERM GOAL #3   Title  The patient will have improved bil hip flexor lengths to +5 degrees bilaterally needed for ease with walking longer distances > 1 mile    Time  4    Period  Weeks    Status  New        PT Long Term Goals - 09/27/18 1151      PT LONG TERM GOAL #1   Title  be independent in advanced HEP and return to gym program     Time  8     Period  Weeks    Status  New    Target Date  11/22/18      PT LONG TERM GOAL #2   Title  reduce FOTO to < or = to 46% limitation    Time  8    Period  Weeks    Status  New      PT LONG TERM GOAL #3   Title  The patient will have at least 75% of lumbar flexion and extension ROM needed for future return to golf    Time  8    Period  Weeks    Status  New      PT LONG TERM GOAL #4   Title  The patient will have at least 4 to 4+/5 strength in bilateral LEs needed for climbing stairs and getting up off the floor    Time  8    Period  Weeks    Status  New      PT LONG TERM GOAL #5   Title  The patient will have 4+/5 core strength needed for lifting/carrying light to medium objects    Time  8    Period  Weeks    Status  New            Plan - 10/18/18 1111    Clinical Impression Statement  The patient is able to return to low to moderate level mobility ex's following a flare up last week.  He reports an initial discomfort in the left posterior hip region which improves with repetition.  He has muscular fatigue especially with gluteus medius retro stepping ex's and step ups with visible muscle trembling on left.  Therapist closely monitoring respose with all treatment interventions.    Personal Factors and Comorbidities  Age;Comorbidity 1;Comorbidity 2;Comorbidity 3+;Time since onset of injury/illness/exacerbation    Comorbidities  3 back  surgeries; chronic history;  sleep apnea; multi joint degenerative changes;  shoulder surgery     Examination-Participation Restrictions  Church;Community Activity;Other    Rehab Potential  Good    PT Frequency  2x / week    PT Duration  8 weeks    PT Treatment/Interventions  ADLs/Self Care Home Management;Cryotherapy;Electrical Stimulation;Moist Heat;Functional mobility training;Therapeutic activities;Therapeutic exercise;Patient/family education;Neuromuscular re-education;Manual techniques;Taping    PT Next Visit Plan  Nu-Step; retro stepping  with blue band;  step ups;  check FOTO;  STGs    PT Home Exercise Plan   Access Code: [email or web address]        Patient will benefit from skilled therapeutic intervention in order to improve the following deficits and impairments:  Pain, Difficulty walking, Decreased strength, Impaired perceived functional ability, Decreased activity tolerance, Decreased endurance, Decreased range of motion  Visit Diagnosis: 1. Chronic left-sided low back pain with left-sided sciatica   2. Muscle weakness (generalized)        Problem List There are no active problems to display for this patient.  Ruben Im, PT 10/18/18 11:16 AM Phone: [phone] Fax: [phone] Alvera Singh 10/18/2018, 11:15 AM  [facility] [address], [place], [place], [address] Phone: [phone]   Fax:  [phone]  Name: Kevin Griffith MRN: [id number] Date of Birth: [birth year]

## 2018-10-20 ENCOUNTER — Ambulatory Visit: Payer: PPO | Admitting: Physical Therapy

## 2018-10-20 ENCOUNTER — Other Ambulatory Visit: Payer: Self-pay

## 2018-10-20 DIAGNOSIS — M5442 Lumbago with sciatica, left side: Secondary | ICD-10-CM | POA: Diagnosis not present

## 2018-10-20 DIAGNOSIS — G8929 Other chronic pain: Secondary | ICD-10-CM

## 2018-10-20 DIAGNOSIS — M6281 Muscle weakness (generalized): Secondary | ICD-10-CM

## 2018-10-20 NOTE — Therapy (Signed)
[facility] [address] [place], [place], [address] Phone: [phone]   Fax:  [phone]  Physical Therapy Treatment  Patient Details  Name: Kevin Griffith MRN: [id number] Date of Birth: January 16, 1951 Referring Provider (PT): Dr. Eustace Moore   Encounter Date: 10/20/2018  PT End of Session - 10/20/18 1059    Visit Number  8    Date for PT Re-Evaluation  11/22/18    Authorization Type  Health Team     PT Start Time  1030    PT Stop Time  1110    PT Time Calculation (min)  40 min    Activity Tolerance  Patient tolerated treatment well       Past Medical History:  Diagnosis Date  . Asymptomatic microscopic hematuria   . Back pain, chronic   . Cervical stenosis of spine   . Colon polyps   . DDD (degenerative disc disease), cervical   . DDD (degenerative disc disease), lumbar   . Drug rash   . ED (erectile dysfunction)   . Hyperbilirubinemia   . Hypertriglyceridemia   . IFG (impaired fasting glucose)   . Insomnia disorder   . Lactose intolerance   . Mood change   . MRSA (methicillin resistant Staphylococcus aureus)   . Palpitations   . Paroxysmal A-fib ([place])   . Skin problem   . Sleep apnea, obstructive   . Vitamin D deficiency     Past Surgical History:  Procedure Laterality Date  . APPENDECTOMY  1970  . [place], 07-2001  . HEMIDISCECECTOMY      There were no vitals filed for this visit.  Subjective Assessment - 10/20/18 1027    Subjective  I put on KT tape I had at home and that allowed me to walk more.  I am able to hold my foot straighter too and push off with walking better.    Pertinent History  sleep apnea; leg length discrepancy; right shoulder surgery 2018 "working great" ;  PLIF L4-5 08/09/18    Patient Stated Goals  get hip flexors, gluteals built back up and leg stronger while gyms are closed ;  build up lats; build up support for spine; do dead lifts; get back to gym; dead  lifts     Currently in Pain?  Yes    Pain Score  1     Pain Location  Hip    Pain Orientation  Left                       [facility] Adult PT Treatment/Exercise - 10/20/18 0001      Lumbar Exercises: Stretches   Piriformis Stretch  Right;Left;2 reps;20 seconds    Piriformis Stretch Limitations  with green ball       Lumbar Exercises: Aerobic   Nustep  L4 10 min    SPM above 100     Lumbar Exercises: Standing   Shoulder Extension  Strengthening;Both;20 reps;Theraband    Theraband Level (Shoulder Extension)  Level 4 (Blue)    Shoulder Extension Limitations  SLS pulses     Other Standing Lumbar Exercises  4 way weight shift standing on black foam 20x    Other Standing Lumbar Exercises  retro stepping with blue band handle resistance 25x       Lumbar Exercises: Supine   Bridge  10 reps    Bridge Limitations  with green ball     Isometric Hip Flexion  10 reps    Other Supine Lumbar Exercises  green ball roll up and down 30x oscillation       Knee/Hip Exercises: Standing   Forward Step Up  Right;Left;1 set;Hand Hold: 1;Step Height: 6"    Forward Step Up Limitations  30 sec each leg                PT Short Term Goals - 10/20/18 1108      PT SHORT TERM GOAL #1   Title  be independent in initial HEP appropriate for lumbar fusion    Status  Achieved      PT SHORT TERM GOAL #2   Title  The patient will report a 30% improvement in back and LE pain and symptoms with usual ADLs    Baseline  50% on 6/24    Status  Achieved      PT SHORT TERM GOAL #3   Title  The patient will have improved bil hip flexor lengths to +5 degrees bilaterally needed for ease with walking longer distances > 1 mile    Status  Achieved        PT Long Term Goals - 09/27/18 1151      PT LONG TERM GOAL #1   Title  be independent in advanced HEP and return to gym program     Time  8    Period  Weeks    Status  New    Target Date  11/22/18      PT LONG TERM GOAL #2   Title   reduce FOTO to < or = to 46% limitation    Time  8    Period  Weeks    Status  New      PT LONG TERM GOAL #3   Title  The patient will have at least 75% of lumbar flexion and extension ROM needed for future return to golf    Time  8    Period  Weeks    Status  New      PT LONG TERM GOAL #4   Title  The patient will have at least 4 to 4+/5 strength in bilateral LEs needed for climbing stairs and getting up off the floor    Time  8    Period  Weeks    Status  New      PT LONG TERM GOAL #5   Title  The patient will have 4+/5 core strength needed for lifting/carrying light to medium objects    Time  8    Period  Weeks    Status  New            Plan - 10/20/18 1100    Clinical Impression Statement  The patient is able to return to to low to moderate intensity lumbo/pelvic/hip strengthening exercise today without increases in pain.  He continues to have decreased hip abduction and extension strength with single leg standing causing a pelvic drop.  Improving distal foot intrinsic strength.  He rates his overall improvement at 50% since start of care.  He feels his hip OA is a limiting factor in his recovery and is going for a "ozone" hip injection in [place] tomorrow.    Comorbidities  3 back surgeries; chronic history;  sleep apnea; multi joint degenerative changes;  shoulder surgery     Rehab Potential  Good    PT Frequency  2x / week    PT Duration  8 weeks  PT Treatment/Interventions  ADLs/Self Care Home Management;Cryotherapy;Electrical Stimulation;Moist Heat;Functional mobility training;Therapeutic activities;Therapeutic exercise;Patient/family education;Neuromuscular re-education;Manual techniques;Taping    PT Next Visit Plan  Nu-Step; retro stepping with blue band;  step ups;  check FOTO; weight shifitng on black foam    PT Home Exercise Plan   Access Code: [email or web address]        Patient will benefit from skilled therapeutic intervention in order to improve the following  deficits and impairments:  Pain, Difficulty walking, Decreased strength, Impaired perceived functional ability, Decreased activity tolerance, Decreased endurance, Decreased range of motion  Visit Diagnosis: 1. Chronic left-sided low back pain with left-sided sciatica   2. Muscle weakness (generalized)        Problem List There are no active problems to display for this patient.  Ruben Im, PT 10/20/18 11:17 AM Phone: [phone] Fax: [phone]  Alvera Singh 10/20/2018, 11:17 AM  [facility] [address] [place], [place], [address] Phone: [phone]   Fax:  [phone]  Name: Kevin Griffith MRN: [id number] Date of Birth: [birth year]

## 2018-10-25 ENCOUNTER — Ambulatory Visit: Payer: PPO | Admitting: Physical Therapy

## 2018-10-25 ENCOUNTER — Other Ambulatory Visit: Payer: Self-pay

## 2018-10-25 ENCOUNTER — Encounter: Payer: Self-pay | Admitting: Physical Therapy

## 2018-10-25 DIAGNOSIS — M5442 Lumbago with sciatica, left side: Secondary | ICD-10-CM

## 2018-10-25 DIAGNOSIS — G8929 Other chronic pain: Secondary | ICD-10-CM

## 2018-10-25 DIAGNOSIS — M6281 Muscle weakness (generalized): Secondary | ICD-10-CM

## 2018-10-25 NOTE — Therapy (Signed)
[facility] [address] [place], [place], [address] Phone: [phone]   Fax:  [phone]  Physical Therapy Treatment  Patient Details  Name: Kevin Griffith MRN: [id number] Date of Birth: 01/12/1951 Referring Provider (PT): Dr. Eustace Moore   Encounter Date: 10/25/2018  PT End of Session - 10/25/18 1104    Visit Number  9    Date for PT Re-Evaluation  11/22/18    Authorization Type  Health Team     PT Start Time  1020    PT Stop Time  1100    PT Time Calculation (min)  40 min    Activity Tolerance  Patient tolerated treatment well       Past Medical History:  Diagnosis Date  . Asymptomatic microscopic hematuria   . Back pain, chronic   . Cervical stenosis of spine   . Colon polyps   . DDD (degenerative disc disease), cervical   . DDD (degenerative disc disease), lumbar   . Drug rash   . ED (erectile dysfunction)   . Hyperbilirubinemia   . Hypertriglyceridemia   . IFG (impaired fasting glucose)   . Insomnia disorder   . Lactose intolerance   . Mood change   . MRSA (methicillin resistant Staphylococcus aureus)   . Palpitations   . Paroxysmal A-fib ([place])   . Skin problem   . Sleep apnea, obstructive   . Vitamin D deficiency     Past Surgical History:  Procedure Laterality Date  . APPENDECTOMY  1970  . [place], 07-2001  . HEMIDISCECECTOMY      There were no vitals filed for this visit.  Subjective Assessment - 10/25/18 1022    Subjective  I had an Ozone hip injection in left hip last hip.  Able to put on shoes easier this morning.   Reports less popping overall although he did have a pop this morning which he interpreted to be a positive improvement.  No longer getting the sharp give ways on steps.  Sunday was able to do steps reciprocally.    Pertinent History  sleep apnea; leg length discrepancy; right shoulder surgery 2018 "working great" ;  PLIF L4-5 08/09/18    Currently in Pain?   Yes    Pain Score  4     Pain Location  Hip    Pain Orientation  Left    Pain Type  Surgical pain                       [facility] Adult PT Treatment/Exercise - 10/25/18 0001      Lumbar Exercises: Stretches   Other Lumbar Stretch Exercise  2nd step stretch 25 right/left without       Other Lumbar Stretch Exercise  slant board for gastroc stretch      Lumbar Exercises: Aerobic   Recumbent Bike  --   attempted but too painful/limited ROM left hip    Nustep  L4 10 min    while discussing progress and status     Lumbar Exercises: Standing   Lifting Limitations  dead lift with golf club for propriceptive cues 8x   10# chair level to waist 8x discontinued   Shoulder Extension  Strengthening;Both;20 reps;Theraband    Theraband Level (Shoulder Extension)  Level 4 (Blue)   standing on black foam   Other Standing Lumbar Exercises  red band Pallof series with tandem standing right/left     Other Standing Lumbar  Exercises  retro stepping with blue band handle resistance 25x                PT Short Term Goals - 10/25/18 1109      PT SHORT TERM GOAL #1   Title  be independent in initial HEP appropriate for lumbar fusion    Status  Achieved      PT SHORT TERM GOAL #2   Title  The patient will report a 30% improvement in back and LE pain and symptoms with usual ADLs    Status  Achieved      PT SHORT TERM GOAL #3   Title  The patient will have improved bil hip flexor lengths to +5 degrees bilaterally needed for ease with walking longer distances > 1 mile    Status  Achieved        PT Long Term Goals - 09/27/18 1151      PT LONG TERM GOAL #1   Title  be independent in advanced HEP and return to gym program     Time  8    Period  Weeks    Status  New    Target Date  11/22/18      PT LONG TERM GOAL #2   Title  reduce FOTO to < or = to 46% limitation    Time  8    Period  Weeks    Status  New      PT LONG TERM GOAL #3   Title  The patient will have at  least 75% of lumbar flexion and extension ROM needed for future return to golf    Time  8    Period  Weeks    Status  New      PT LONG TERM GOAL #4   Title  The patient will have at least 4 to 4+/5 strength in bilateral LEs needed for climbing stairs and getting up off the floor    Time  8    Period  Weeks    Status  New      PT LONG TERM GOAL #5   Title  The patient will have 4+/5 core strength needed for lifting/carrying light to medium objects    Time  8    Period  Weeks    Status  New            Plan - 10/25/18 1104    Clinical Impression Statement  The patient reports improved hip mobility following his recent hip procedure with increased stride length noted.  He does continue to have pelvic drop/trendelenberg gait and pelvic drop with SLS on right.  Therapist discontinued dead lift with 10# from chair seat level secondary to patient having difficulty maintaining hip hinge rather than spinal flexion.  With fatigue in standing, increased trunk flexion noted today.  Therapist closely monitoring response and modifying treatment session as needed.    Comorbidities  3 back surgeries; chronic history;  sleep apnea; multi joint degenerative changes;  shoulder surgery     Examination-Activity Limitations  Stairs;Lift;Squat;Bend;Locomotion Level    Examination-Participation Restrictions  Church;Community Activity;Other    Rehab Potential  Good    PT Frequency  2x / week    PT Duration  8 weeks    PT Treatment/Interventions  ADLs/Self Care Home Management;Cryotherapy;Electrical Stimulation;Moist Heat;Functional mobility training;Therapeutic activities;Therapeutic exercise;Patient/family education;Neuromuscular re-education;Manual techniques;Taping    PT Next Visit Plan  10th visit prog note;  Nu-Step; retro stepping with blue band;  step ups;  check FOTO; weight shifitng on black foam    PT Home Exercise Plan   Access Code: [email or web address]        Patient will benefit from skilled  therapeutic intervention in order to improve the following deficits and impairments:  Pain, Difficulty walking, Decreased strength, Impaired perceived functional ability, Decreased activity tolerance, Decreased endurance, Decreased range of motion  Visit Diagnosis: 1. Muscle weakness (generalized)   2. Chronic left-sided low back pain with left-sided sciatica        Problem List There are no active problems to display for this patient.  Ruben Im, PT 10/25/18 11:10 AM Phone: [phone] Fax: [phone] Alvera Singh 10/25/2018, 11:10 AM  [facility] [address] [place], [place], [address] Phone: [phone]   Fax:  [phone]  Name: Kevin Griffith MRN: [id number] Date of Birth: [birth year]

## 2018-10-27 ENCOUNTER — Encounter: Payer: Self-pay | Admitting: Physical Therapy

## 2018-10-27 ENCOUNTER — Ambulatory Visit: Payer: PPO | Attending: Neurological Surgery | Admitting: Physical Therapy

## 2018-10-27 ENCOUNTER — Other Ambulatory Visit: Payer: Self-pay

## 2018-10-27 DIAGNOSIS — M25611 Stiffness of right shoulder, not elsewhere classified: Secondary | ICD-10-CM | POA: Insufficient documentation

## 2018-10-27 DIAGNOSIS — M5442 Lumbago with sciatica, left side: Secondary | ICD-10-CM | POA: Insufficient documentation

## 2018-10-27 DIAGNOSIS — M25511 Pain in right shoulder: Secondary | ICD-10-CM | POA: Diagnosis not present

## 2018-10-27 DIAGNOSIS — G8929 Other chronic pain: Secondary | ICD-10-CM | POA: Insufficient documentation

## 2018-10-27 DIAGNOSIS — M6281 Muscle weakness (generalized): Secondary | ICD-10-CM | POA: Diagnosis not present

## 2018-10-27 NOTE — Therapy (Signed)
[facility] [address] [place], [place], [address] Phone: [phone]   Fax:  [phone]  Physical Therapy Treatment  Patient Details  Name: Kevin Griffith MRN: [id number] Date of Birth: [birth year] Referring Provider (PT): Dr. Eustace Moore  Progress Note Reporting Period 09/27/18 to 10/27/18  See note below for Objective Data and Assessment of Progress/Goals.      Encounter Date: 10/27/2018  PT End of Session - 10/27/18 1047    Visit Number  10    Date for PT Re-Evaluation  11/22/18    Authorization Type  Health Team     PT Start Time  1027    PT Stop Time  1105    PT Time Calculation (min)  38 min    Activity Tolerance  Patient tolerated treatment well       Past Medical History:  Diagnosis Date  . Asymptomatic microscopic hematuria   . Back pain, chronic   . Cervical stenosis of spine   . Colon polyps   . DDD (degenerative disc disease), cervical   . DDD (degenerative disc disease), lumbar   . Drug rash   . ED (erectile dysfunction)   . Hyperbilirubinemia   . Hypertriglyceridemia   . IFG (impaired fasting glucose)   . Insomnia disorder   . Lactose intolerance   . Mood change   . MRSA (methicillin resistant Staphylococcus aureus)   . Palpitations   . Paroxysmal A-fib ([place])   . Skin problem   . Sleep apnea, obstructive   . Vitamin D deficiency     Past Surgical History:  Procedure Laterality Date  . APPENDECTOMY  1970  . [place], 07-2001  . HEMIDISCECECTOMY      There were no vitals filed for this visit.  Subjective Assessment - 10/27/18 1029    Subjective  I overdid it yesterday and walked a mile. That nerve got inflammed afterwards.  I chipped some golf balls and did 8# arm workout.  My brain is not awake this morning.    Pertinent History  sleep apnea; leg length discrepancy; right shoulder surgery 2018 "working great" ;  PLIF L4-5 08/09/18    Patient Stated Goals  get hip  flexors, gluteals built back up and leg stronger while gyms are closed ;  build up lats; build up support for spine; do dead lifts; get back to gym; dead lifts     Currently in Pain?  Yes    Pain Score  3     Pain Location  Back    Pain Orientation  Left         [facility] PT Assessment - 10/27/18 0001      Observation/Other Assessments   Focus on Therapeutic Outcomes (FOTO)   41%       Strength   Right Hip Flexion  4+/5    Right Hip Extension  4+/5    Right Hip ABduction  4/5    Left Hip Flexion  4/5    Left Hip Extension  4-/5    Left Hip ABduction  4-/5    Lumbar Flexion  4-/5    Lumbar Extension  4-/5                   [facility] Adult PT Treatment/Exercise - 10/27/18 0001      Lumbar Exercises: Aerobic   Nustep  L4 15 min    while discussing progress     Lumbar Exercises: Standing  Other Standing Lumbar Exercises  red band Pallof series with staggered standing right/left:  forward press, overhead, press out then up, retro lunge, marching     Other Standing Lumbar Exercises  retro stepping with blue band handle resistance 20x on black foam  on each leg staggered standing       Knee/Hip Exercises: Standing   Other Standing Knee Exercises  standing on black foam with green band diagonals:  even stand, staggered standing and attempting SLS 5x each right/left    band over the top of the door   Other Standing Knee Exercises  facing wall: squeeze bil glutes with bil UE wall slides 5x, staggered stand with glute squeeze and single arm slide, knee lift and single arm wall slides 5x right/left                PT Short Term Goals - 10/27/18 1121      PT SHORT TERM GOAL #1   Title  be independent in initial HEP appropriate for lumbar fusion    Status  Achieved      PT SHORT TERM GOAL #2   Title  The patient will report a 30% improvement in back and LE pain and symptoms with usual ADLs    Status  Achieved      PT SHORT TERM GOAL #3   Title  The patient will have  improved bil hip flexor lengths to +5 degrees bilaterally needed for ease with walking longer distances > 1 mile    Status  Achieved        PT Long Term Goals - 10/27/18 1120      PT LONG TERM GOAL #1   Title  be independent in advanced HEP and return to gym program     Time  8    Period  Weeks    Status  On-going      PT LONG TERM GOAL #2   Title  reduce FOTO to < or = to 46% limitation    Status  Achieved      PT LONG TERM GOAL #3   Title  The patient will have at least 75% of lumbar flexion and extension ROM needed for future return to golf    Time  8    Period  Weeks    Status  On-going      PT LONG TERM GOAL #4   Title  The patient will have at least 4 to 4+/5 strength in bilateral LEs needed for climbing stairs and getting up off the floor    Time  8    Period  Weeks    Status  On-going      PT LONG TERM GOAL #5   Title  The patient will have 4+/5 core strength needed for lifting/carrying light to medium objects    Time  8    Period  Weeks    Status  On-going            Plan - 10/27/18 1111    Clinical Impression Statement  The patient reports extensive activity yesterday and therefore not feeling as well today.  Treatment modified in accordance to his pain level.  More frequent rest breaks needed between ex's secondary secondary to mild shortness of breath which he attributes to wearing a mask. O2 sat levels 97% and HR 119 during ex.  Left hip weakness and occasional hip popping persist although patient reports improved joint mobility and hip abduction strength has improved since intial assessment.  His FOTO functional outcome score has improved significantly in the past month.  Progressing with rehab goals.  In addition to some modifcations, therapist encouraging graded exercise as patient tends to "overdo".    Comorbidities  3 back surgeries; chronic history;  sleep apnea; multi joint degenerative changes;  shoulder surgery     Examination-Participation  Restrictions  Church;Community Activity;Other    Rehab Potential  Good    PT Frequency  2x / week    PT Duration  8 weeks    PT Treatment/Interventions  ADLs/Self Care Home Management;Cryotherapy;Electrical Stimulation;Moist Heat;Functional mobility training;Therapeutic activities;Therapeutic exercise;Patient/family education;Neuromuscular re-education;Manual techniques;Taping    PT Next Visit Plan  Nu-Step; retro stepping with blue band;  step ups;  weight shifitng on black foam;  standing anti-rotary core ex's using UE movements    PT Home Exercise Plan   Access Code: [email or web address]        Patient will benefit from skilled therapeutic intervention in order to improve the following deficits and impairments:  Pain, Difficulty walking, Decreased strength, Impaired perceived functional ability, Decreased activity tolerance, Decreased endurance, Decreased range of motion  Visit Diagnosis: 1. Muscle weakness (generalized)   2. Chronic left-sided low back pain with left-sided sciatica        Problem List There are no active problems to display for this patient.  Ruben Im, PT 10/27/18 11:22 AM Phone: [phone] Fax: [phone]  Alvera Singh 10/27/2018, 11:22 AM  [facility] [address], [place], [place], [address] Phone: [phone]   Fax:  [phone]  Name: ALEKAI POCOCK MRN: [id number] Date of Birth: Sep 23, 1950

## 2018-11-01 ENCOUNTER — Other Ambulatory Visit: Payer: Self-pay

## 2018-11-01 ENCOUNTER — Ambulatory Visit: Payer: PPO | Admitting: Physical Therapy

## 2018-11-01 ENCOUNTER — Encounter: Payer: Self-pay | Admitting: Physical Therapy

## 2018-11-01 DIAGNOSIS — M25611 Stiffness of right shoulder, not elsewhere classified: Secondary | ICD-10-CM

## 2018-11-01 DIAGNOSIS — M25511 Pain in right shoulder: Secondary | ICD-10-CM

## 2018-11-01 DIAGNOSIS — M5442 Lumbago with sciatica, left side: Secondary | ICD-10-CM

## 2018-11-01 DIAGNOSIS — M6281 Muscle weakness (generalized): Secondary | ICD-10-CM | POA: Diagnosis not present

## 2018-11-01 DIAGNOSIS — G8929 Other chronic pain: Secondary | ICD-10-CM

## 2018-11-01 NOTE — Therapy (Signed)
[facility] [address], [place], [place], [address] Phone: [phone]   Fax:  [phone]  Physical Therapy Treatment  Patient Details  Name: Kevin Griffith MRN: [id number] Date of Birth: 1951-01-26 Referring Provider (PT): Dr. Eustace Moore   Encounter Date: 11/01/2018  PT End of Session - 11/01/18 0801    Visit Number  11    Date for PT Re-Evaluation  11/22/18    Authorization Type  Health Team     PT Start Time  0755    PT Stop Time  0839    PT Time Calculation (min)  44 min    Activity Tolerance  Patient tolerated treatment well    Behavior During Therapy  [facility] for tasks assessed/performed       Past Medical History:  Diagnosis Date  . Asymptomatic microscopic hematuria   . Back pain, chronic   . Cervical stenosis of spine   . Colon polyps   . DDD (degenerative disc disease), cervical   . DDD (degenerative disc disease), lumbar   . Drug rash   . ED (erectile dysfunction)   . Hyperbilirubinemia   . Hypertriglyceridemia   . IFG (impaired fasting glucose)   . Insomnia disorder   . Lactose intolerance   . Mood change   . MRSA (methicillin resistant Staphylococcus aureus)   . Palpitations   . Paroxysmal A-fib ([place])   . Skin problem   . Sleep apnea, obstructive   . Vitamin D deficiency     Past Surgical History:  Procedure Laterality Date  . APPENDECTOMY  1970  . [place], 07-2001  . HEMIDISCECECTOMY      There were no vitals filed for this visit.  Subjective Assessment - 11/01/18 0803    Subjective  Of course I overdid on Saturday, very sore ( muscular) on Sunday. Today just a little pain in my LT low back nerve root area.    Pertinent History  sleep apnea; leg length discrepancy; right shoulder surgery 2018 "working great" ;  PLIF L4-5 08/09/18    Limitations  Walking;House hold activities    Currently in Pain?  Yes    Pain Score  3     Pain Location  Back    Pain Orientation  Left     Pain Descriptors / Indicators  Dull;Discomfort    Aggravating Factors   bending over too much, too often    Pain Relieving Factors  building my muscles back up                       [facility] Adult PT Treatment/Exercise - 11/01/18 0001      Lumbar Exercises: Stretches   Other Lumbar Stretch Exercise  Hand to Big Toe Stretch series LTLE with strap 30 sec each position      Lumbar Exercises: Aerobic   Nustep  L4 15 min    while discussing progress     Lumbar Exercises: Standing   Other Standing Lumbar Exercises  standing on black pad: tandem stance each side 30 sec, then add red band scap series 5x each 2 sets, light CGA     Other Standing Lumbar Exercises  retro walking with 30# at cable station 10x, then side stepping 6x each side with close CGA,    VC to contract his gluteals     Manual Therapy   Passive ROM  LT hip 10x add/abd,  PT Short Term Goals - 10/27/18 1121      PT SHORT TERM GOAL #1   Title  be independent in initial HEP appropriate for lumbar fusion    Status  Achieved      PT SHORT TERM GOAL #2   Title  The patient will report a 30% improvement in back and LE pain and symptoms with usual ADLs    Status  Achieved      PT SHORT TERM GOAL #3   Title  The patient will have improved bil hip flexor lengths to +5 degrees bilaterally needed for ease with walking longer distances > 1 mile    Status  Achieved        PT Long Term Goals - 10/27/18 1120      PT LONG TERM GOAL #1   Title  be independent in advanced HEP and return to gym program     Time  8    Period  Weeks    Status  On-going      PT LONG TERM GOAL #2   Title  reduce FOTO to < or = to 46% limitation    Status  Achieved      PT LONG TERM GOAL #3   Title  The patient will have at least 75% of lumbar flexion and extension ROM needed for future return to golf    Time  8    Period  Weeks    Status  On-going      PT LONG TERM GOAL #4   Title  The patient will  have at least 4 to 4+/5 strength in bilateral LEs needed for climbing stairs and getting up off the floor    Time  8    Period  Weeks    Status  On-going      PT LONG TERM GOAL #5   Title  The patient will have 4+/5 core strength needed for lifting/carrying light to medium objects    Time  8    Period  Weeks    Status  On-going            Plan - 11/01/18 0801    Clinical Impression Statement  Pt presents with some muscular soreness from his yard work on Saturday and dull pain at the "L5 nerve root." Session focused on core and hip strength in standing, initially with active motion but we modified to more of a static stance to honor his joint pain/discomfort. Stretching at the end of session helped reduce his hip soreness at end of sesion along with hip PROM.    Personal Factors and Comorbidities  Age;Comorbidity 1;Comorbidity 2;Comorbidity 3+;Time since onset of injury/illness/exacerbation    Comorbidities  3 back surgeries; chronic history;  sleep apnea; multi joint degenerative changes;  shoulder surgery     Examination-Activity Limitations  Stairs;Lift;Squat;Bend;Locomotion Level    Examination-Participation Restrictions  Church;Community Activity;Other    Stability/Clinical Decision Making  Evolving/Moderate complexity    Rehab Potential  Good    PT Frequency  2x / week    PT Duration  8 weeks    PT Treatment/Interventions  ADLs/Self Care Home Management;Cryotherapy;Electrical Stimulation;Moist Heat;Functional mobility training;Therapeutic activities;Therapeutic exercise;Patient/family education;Neuromuscular re-education;Manual techniques;Taping    PT Next Visit Plan  Nu-Step; retro stepping with blue band;  step ups;  weight shifitng on black foam;  standing anti-rotary core ex's using UE movements    PT Home Exercise Plan   Access Code: [email or web address]     Consulted and Agree with Plan  of Care  Patient       Patient will benefit from skilled therapeutic intervention in order to  improve the following deficits and impairments:  Pain, Difficulty walking, Decreased strength, Impaired perceived functional ability, Decreased activity tolerance, Decreased endurance, Decreased range of motion  Visit Diagnosis: 1. Muscle weakness (generalized)   2. Chronic left-sided low back pain with left-sided sciatica   3. Acute pain of right shoulder   4. Stiffness of right shoulder, not elsewhere classified        Problem List There are no active problems to display for this patient.   Koda Routon, PTA 11/01/2018, 8:46 AM  [facility] [address] [place], [place], [address] Phone: [phone]   Fax:  [phone]  Name: Kevin Griffith MRN: [id number] Date of Birth: January 05, 1951

## 2018-11-03 ENCOUNTER — Ambulatory Visit: Payer: PPO | Admitting: Physical Therapy

## 2018-11-03 ENCOUNTER — Other Ambulatory Visit: Payer: Self-pay

## 2018-11-03 ENCOUNTER — Encounter: Payer: Self-pay | Admitting: Physical Therapy

## 2018-11-03 DIAGNOSIS — M6281 Muscle weakness (generalized): Secondary | ICD-10-CM

## 2018-11-03 DIAGNOSIS — M25611 Stiffness of right shoulder, not elsewhere classified: Secondary | ICD-10-CM

## 2018-11-03 DIAGNOSIS — M25511 Pain in right shoulder: Secondary | ICD-10-CM

## 2018-11-03 DIAGNOSIS — G8929 Other chronic pain: Secondary | ICD-10-CM

## 2018-11-03 NOTE — Therapy (Signed)
[facility] [address], [place], [place], [address] Phone: [phone]   Fax:  [phone]  Physical Therapy Treatment  Patient Details  Name: Kevin Griffith MRN: [id number] Date of Birth: 1951-02-11 Referring Provider (PT): Dr. Eustace Moore   Encounter Date: 11/03/2018  PT End of Session - 11/03/18 0800    Visit Number  12    Date for PT Re-Evaluation  11/22/18    Authorization Type  Health Team     PT Start Time  [phone]    PT Stop Time  0841    PT Time Calculation (min)  43 min    Activity Tolerance  Patient tolerated treatment well    Behavior During Therapy  [facility] for tasks assessed/performed       Past Medical History:  Diagnosis Date  . Asymptomatic microscopic hematuria   . Back pain, chronic   . Cervical stenosis of spine   . Colon polyps   . DDD (degenerative disc disease), cervical   . DDD (degenerative disc disease), lumbar   . Drug rash   . ED (erectile dysfunction)   . Hyperbilirubinemia   . Hypertriglyceridemia   . IFG (impaired fasting glucose)   . Insomnia disorder   . Lactose intolerance   . Mood change   . MRSA (methicillin resistant Staphylococcus aureus)   . Palpitations   . Paroxysmal A-fib ([place])   . Skin problem   . Sleep apnea, obstructive   . Vitamin D deficiency     Past Surgical History:  Procedure Laterality Date  . APPENDECTOMY  1970  . [place], 07-2001  . HEMIDISCECECTOMY      There were no vitals filed for this visit.  Subjective Assessment - 11/03/18 0802    Subjective  Sleepy this AM. I did pretty good after Monday. Did not walk too much yesterday but I stretched more. My hip muscles are sore.    Pertinent History  sleep apnea; leg length discrepancy; right shoulder surgery 2018 "working great" ;  PLIF L4-5 08/09/18    Currently in Pain?  Yes    Pain Score  2     Pain Location  Back    Pain Orientation  Left    Pain Descriptors / Indicators   Dull;Burning    Multiple Pain Sites  No                       [facility] Adult PT Treatment/Exercise - 11/03/18 0001      Lumbar Exercises: Aerobic   Nustep  L4 15 min    while discussing progress     Lumbar Exercises: Standing   Other Standing Lumbar Exercises  retro walking with 30# at cable station 10x, then side stepping 6x each side with close CGA,    VC to contract his gluteals     Lumbar Exercises: Seated   Other Seated Lumbar Exercises  On blue ball: sitting with > thoracic extension x 1 min; red band horizontal & diagonal pulls 20x each while staying tall    Close supervision to get off ball     Knee/Hip Exercises: Standing   Rebounder  Rocking in semi tandem   1 min each side     Manual Therapy   Soft tissue mobilization  LT hip flexor, glute medius    Passive ROM  LT hip 10x add/abd, IR/ER  PT Short Term Goals - 10/27/18 1121      PT SHORT TERM GOAL #1   Title  be independent in initial HEP appropriate for lumbar fusion    Status  Achieved      PT SHORT TERM GOAL #2   Title  The patient will report a 30% improvement in back and LE pain and symptoms with usual ADLs    Status  Achieved      PT SHORT TERM GOAL #3   Title  The patient will have improved bil hip flexor lengths to +5 degrees bilaterally needed for ease with walking longer distances > 1 mile    Status  Achieved        PT Long Term Goals - 10/27/18 1120      PT LONG TERM GOAL #1   Title  be independent in advanced HEP and return to gym program     Time  8    Period  Weeks    Status  On-going      PT LONG TERM GOAL #2   Title  reduce FOTO to < or = to 46% limitation    Status  Achieved      PT LONG TERM GOAL #3   Title  The patient will have at least 75% of lumbar flexion and extension ROM needed for future return to golf    Time  8    Period  Weeks    Status  On-going      PT LONG TERM GOAL #4   Title  The patient will have at least 4 to 4+/5 strength  in bilateral LEs needed for climbing stairs and getting up off the floor    Time  8    Period  Weeks    Status  On-going      PT LONG TERM GOAL #5   Title  The patient will have 4+/5 core strength needed for lifting/carrying light to medium objects    Time  8    Period  Weeks    Status  On-going            Plan - 11/03/18 0801    Clinical Impression Statement  Pt reports today he feels 75% less pain in his back and hip since the start of therapy. Lt hip remains very limited in ROM. This tends to be a large barrier to increasing his hip strength and lumbopelvic stability. Pt was asked to sit on a physioball today to facilitate back extensor strength. This was quite fatiguing for pt. No complaints of increased pain with exercises.    Personal Factors and Comorbidities  Age;Comorbidity 1;Comorbidity 2;Comorbidity 3+;Time since onset of injury/illness/exacerbation    Comorbidities  3 back surgeries; chronic history;  sleep apnea; multi joint degenerative changes;  shoulder surgery     Examination-Activity Limitations  Stairs;Lift;Squat;Bend;Locomotion Level    Examination-Participation Restrictions  Church;Community Activity;Other    Stability/Clinical Decision Making  Evolving/Moderate complexity    Rehab Potential  Good    PT Frequency  2x / week    PT Duration  8 weeks    PT Treatment/Interventions  ADLs/Self Care Home Management;Cryotherapy;Electrical Stimulation;Moist Heat;Functional mobility training;Therapeutic activities;Therapeutic exercise;Patient/family education;Neuromuscular re-education;Manual techniques;Taping    PT Next Visit Plan  MMT for goals, continue with core strength, back strength and hip motion that is tolerable.    PT Home Exercise Plan   Access Code: [email or web address]     Consulted and Agree with Plan of Care  Patient  Patient will benefit from skilled therapeutic intervention in order to improve the following deficits and impairments:  Pain, Difficulty walking,  Decreased strength, Impaired perceived functional ability, Decreased activity tolerance, Decreased endurance, Decreased range of motion  Visit Diagnosis: 1. Muscle weakness (generalized)   2. Chronic left-sided low back pain with left-sided sciatica   3. Acute pain of right shoulder   4. Stiffness of right shoulder, not elsewhere classified        Problem List There are no active problems to display for this patient.   Deandra Goering, PTA 11/03/2018, 8:47 AM  [facility] [address] [place], [place], [address] Phone: [phone]   Fax:  [phone]  Name: JAVONI LUCKEN MRN: [id number] Date of Birth: 1950-08-24

## 2018-11-08 ENCOUNTER — Other Ambulatory Visit: Payer: Self-pay

## 2018-11-08 ENCOUNTER — Encounter: Payer: Self-pay | Admitting: Physical Therapy

## 2018-11-08 ENCOUNTER — Ambulatory Visit: Payer: PPO | Admitting: Physical Therapy

## 2018-11-08 DIAGNOSIS — M6281 Muscle weakness (generalized): Secondary | ICD-10-CM | POA: Diagnosis not present

## 2018-11-08 DIAGNOSIS — G8929 Other chronic pain: Secondary | ICD-10-CM

## 2018-11-08 NOTE — Therapy (Signed)
[facility] [address] [place], [place], [address] Phone: [phone]   Fax:  [phone]  Physical Therapy Treatment  Patient Details  Name: Kevin Griffith MRN: [id number] Date of Birth: Apr 22, 1951 Referring Provider (PT): Dr. Eustace Moore   Encounter Date: 11/08/2018  PT End of Session - 11/08/18 0832    Visit Number  13    Date for PT Re-Evaluation  11/22/18    Authorization Type  Health Team     PT Start Time  0825    PT Stop Time  0908    PT Time Calculation (min)  43 min    Activity Tolerance  Patient tolerated treatment well       Past Medical History:  Diagnosis Date  . Asymptomatic microscopic hematuria   . Back pain, chronic   . Cervical stenosis of spine   . Colon polyps   . DDD (degenerative disc disease), cervical   . DDD (degenerative disc disease), lumbar   . Drug rash   . ED (erectile dysfunction)   . Hyperbilirubinemia   . Hypertriglyceridemia   . IFG (impaired fasting glucose)   . Insomnia disorder   . Lactose intolerance   . Mood change   . MRSA (methicillin resistant Staphylococcus aureus)   . Palpitations   . Paroxysmal A-fib ([place])   . Skin problem   . Sleep apnea, obstructive   . Vitamin D deficiency     Past Surgical History:  Procedure Laterality Date  . APPENDECTOMY  1970  . [place], 07-2001  . HEMIDISCECECTOMY      There were no vitals filed for this visit.  Subjective Assessment - 11/08/18 0825    Subjective  Griffith've been walking several miles a day (5.2 miles on Friday).   Using walking poles but that bothered the lateral hip pain.    Griffith feel cramping near my big toe.  Sleeping better taking 1 [place] before bed.  Griffith did some yardwork/hoe some weeds.    Pertinent History  sleep apnea; leg length discrepancy; right shoulder surgery 2018 "working great" ;  PLIF L4-5 08/09/18    Patient Stated Goals  get hip flexors, gluteals built back up and leg stronger while  gyms are closed ;  build up lats; build up support for spine; do dead lifts; get back to gym; dead lifts     Currently in Pain?  Yes    Pain Score  2     Pain Orientation  Left    Pain Type  Surgical pain    Pain Radiating Towards  toes on both feet "cramping or nerve root"                       [facility] Adult PT Treatment/Exercise - 11/08/18 0001      Lumbar Exercises: Aerobic   UBE (Upper Arm Bike)  L1 5  min sitting on blue ball forward and back     Nustep  L3-4 10 min, L0 5 min     while discussing status and progress     Lumbar Exercises: Machines for Strengthening   Other Lumbar Machine Exercise  seated lat bar 30# 25x    Other Lumbar Machine Exercise  --      Lumbar Exercises: Standing   Other Standing Lumbar Exercises  resisted walking backwards 45# 5x2; left side stepping 45# 5x right/left       Ankle Exercises: Seated   Towel  Crunch  5 reps    Towel Crunch Limitations  bil     Other Seated Ankle Exercises  dissociated great toe and smaller toe lifts 10x bil       Ankle Exercises: Stretches   Plantar Fascia Stretch  3 reps    Plantar Fascia Stretch Limitations  pulling toes back  Right/left               PT Short Term Goals - 10/27/18 1121      PT SHORT TERM GOAL #1   Title  be independent in initial HEP appropriate for lumbar fusion    Status  Achieved      PT SHORT TERM GOAL #2   Title  The patient will report a 30% improvement in back and LE pain and symptoms with usual ADLs    Status  Achieved      PT SHORT TERM GOAL #3   Title  The patient will have improved bil hip flexor lengths to +5 degrees bilaterally needed for ease with walking longer distances > 1 mile    Status  Achieved        PT Long Term Goals - 10/27/18 1120      PT LONG TERM GOAL #1   Title  be independent in advanced HEP and return to gym program     Time  8    Period  Weeks    Status  On-going      PT LONG TERM GOAL #2   Title  reduce FOTO to < or = to 46%  limitation    Status  Achieved      PT LONG TERM GOAL #3   Title  The patient will have at least 75% of lumbar flexion and extension ROM needed for future return to golf    Time  8    Period  Weeks    Status  On-going      PT LONG TERM GOAL #4   Title  The patient will have at least 4 to 4+/5 strength in bilateral LEs needed for climbing stairs and getting up off the floor    Time  8    Period  Weeks    Status  On-going      PT LONG TERM GOAL #5   Title  The patient will have 4+/5 core strength needed for lifting/carrying light to medium objects    Time  8    Period  Weeks    Status  On-going            Plan - 11/08/18 2979    Clinical Impression Statement  Much improved gait endurance and overall activity tolerance.  Treatment modified to include more seated vs. standing exercise secondary to hip pain complaints.  Patient notes improved foot motor control at the end of treatment session and a positive response to seated lat bar although somewhat uncomfortable on right hip.  Therapist closely monitoring response with all treatment interventions and modifying as needed.    Comorbidities  3 back surgeries; chronic history;  sleep apnea; multi joint degenerative changes;  shoulder surgery     Rehab Potential  Good    PT Frequency  2x / week    PT Duration  8 weeks    PT Treatment/Interventions  ADLs/Self Care Home Management;Cryotherapy;Electrical Stimulation;Moist Heat;Functional mobility training;Therapeutic activities;Therapeutic exercise;Patient/family education;Neuromuscular re-education;Manual techniques;Taping    PT Next Visit Plan  core strength, back strength, foot intrinsics muscle strengthening; gentle hip mobility  PT Home Exercise Plan   Access Code: [email or web address]        Patient will benefit from skilled therapeutic intervention in order to improve the following deficits and impairments:  Pain, Difficulty walking, Decreased strength, Impaired perceived functional  ability, Decreased activity tolerance, Decreased endurance, Decreased range of motion  Visit Diagnosis: 1. Muscle weakness (generalized)   2. Chronic left-sided low back pain with left-sided sciatica        Problem List There are no active problems to display for this patient.  Kevin Im, PT 11/08/18 9:17 AM Phone: [phone] Fax: [phone] Alvera Singh 11/08/2018, 9:16 AM  [facility] [address] [place], [place], [address] Phone: [phone]   Fax:  [phone]  Name: Kevin Griffith MRN: [id number] Date of Birth: 1950-10-10

## 2018-11-10 ENCOUNTER — Other Ambulatory Visit: Payer: Self-pay

## 2018-11-10 ENCOUNTER — Encounter: Payer: Self-pay | Admitting: Physical Therapy

## 2018-11-10 ENCOUNTER — Ambulatory Visit: Payer: PPO | Admitting: Physical Therapy

## 2018-11-10 DIAGNOSIS — M6281 Muscle weakness (generalized): Secondary | ICD-10-CM | POA: Diagnosis not present

## 2018-11-10 DIAGNOSIS — G8929 Other chronic pain: Secondary | ICD-10-CM

## 2018-11-10 DIAGNOSIS — M5442 Lumbago with sciatica, left side: Secondary | ICD-10-CM

## 2018-11-10 NOTE — Therapy (Signed)
[facility] [address] [place], [place], [address] Phone: [phone]   Fax:  [phone]  Physical Therapy Treatment  Patient Details  Name: Kevin Griffith MRN: [id number] Date of Birth: [birth year] Referring Provider (PT): Dr. Eustace Moore   Encounter Date: 11/10/2018  PT End of Session - 11/10/18 1018    Visit Number  14    Date for PT Re-Evaluation  11/22/18    Authorization Type  Health Team     PT Start Time  [phone]    PT Stop Time  1008    PT Time Calculation (min)  41 min    Activity Tolerance  Patient tolerated treatment well       Past Medical History:  Diagnosis Date  . Asymptomatic microscopic hematuria   . Back pain, chronic   . Cervical stenosis of spine   . Colon polyps   . DDD (degenerative disc disease), cervical   . DDD (degenerative disc disease), lumbar   . Drug rash   . ED (erectile dysfunction)   . Hyperbilirubinemia   . Hypertriglyceridemia   . IFG (impaired fasting glucose)   . Insomnia disorder   . Lactose intolerance   . Mood change   . MRSA (methicillin resistant Staphylococcus aureus)   . Palpitations   . Paroxysmal A-fib ([place])   . Skin problem   . Sleep apnea, obstructive   . Vitamin D deficiency     Past Surgical History:  Procedure Laterality Date  . APPENDECTOMY  1970  . [place], 07-2001  . HEMIDISCECECTOMY      There were no vitals filed for this visit.  Subjective Assessment - 11/10/18 0931    Subjective  No pain walking in this morning.  Strengthening in my low back really helps.  Less numbness in feet.  Going for his 2nd hip ozone injection tomorrow so he will take it easy for 3 days.  States he plans to go for a walk when he leaves here.    Pertinent History  sleep apnea; leg length discrepancy; right shoulder surgery 2018 "working great" ;  PLIF L4-5 08/09/18    Currently in Pain?  No/denies    Pain Score  0-No pain    Pain Location  Back    Pain  Orientation  Left         [facility] PT Assessment - 11/10/18 0001      AROM   Overall AROM Comments  hip external rotation 40 d      Strength   Right Hip Flexion  4+/5    Right Hip Extension  4+/5    Right Hip ABduction  4/5    Left Hip Flexion  4/5    Left Hip Extension  4/5    Left Hip ABduction  4-/5    Lumbar Flexion  4-/5    Lumbar Extension  4-/5                   [facility] Adult PT Treatment/Exercise - 11/10/18 0001      Lumbar Exercises: Stretches   Piriformis Stretch  Right;Left;1 rep;30 seconds      Lumbar Exercises: Aerobic   UBE (Upper Arm Bike)  L2 10 min sitting on blue ball forward and back       Lumbar Exercises: Machines for Strengthening   Other Lumbar Machine Exercise  seated lat bar 30# 3x10    Other Lumbar Machine Exercise  seated rows 30# 2x10  Lumbar Exercises: Standing   Other Standing Lumbar Exercises  staggered stand with small diagonals 25# 2x10    Other Standing Lumbar Exercises  25# bil UE overhead flexion to extension in staggered stand       Lumbar Exercises: Supine   Other Supine Lumbar Exercises  red band holds at 90 degrees antirotary 10x each way    Other Supine Lumbar Exercises  bent knees in air table top with bil UE straight arm pull downs 15x                PT Short Term Goals - 10/27/18 1121      PT SHORT TERM GOAL #1   Title  be independent in initial HEP appropriate for lumbar fusion    Status  Achieved      PT SHORT TERM GOAL #2   Title  The patient will report a 30% improvement in back and LE pain and symptoms with usual ADLs    Status  Achieved      PT SHORT TERM GOAL #3   Title  The patient will have improved bil hip flexor lengths to +5 degrees bilaterally needed for ease with walking longer distances > 1 mile    Status  Achieved        PT Long Term Goals - 10/27/18 1120      PT LONG TERM GOAL #1   Title  be independent in advanced HEP and return to gym program     Time  8    Period  Weeks     Status  On-going      PT LONG TERM GOAL #2   Title  reduce FOTO to < or = to 46% limitation    Status  Achieved      PT LONG TERM GOAL #3   Title  The patient will have at least 75% of lumbar flexion and extension ROM needed for future return to golf    Time  8    Period  Weeks    Status  On-going      PT LONG TERM GOAL #4   Title  The patient will have at least 4 to 4+/5 strength in bilateral LEs needed for climbing stairs and getting up off the floor    Time  8    Period  Weeks    Status  On-going      PT LONG TERM GOAL #5   Title  The patient will have 4+/5 core strength needed for lifting/carrying light to medium objects    Time  8    Period  Weeks    Status  On-going            Plan - 11/10/18 1028    Clinical Impression Statement  The patient demonstrates improved hip joint and soft tissue mobility especially with hip external rotation.  Lumbo/pelvic/hip strength also improved with single limb staggered standing and single limb stance.  Patient need close monitoring and cues to avoid spinal rotation and focus on small controlled movements.  Will continue to encourage graded movement to avoid his tendency of "overdoing it."    Comorbidities  3 back surgeries; chronic history;  sleep apnea; multi joint degenerative changes;  shoulder surgery     Examination-Activity Limitations  Stairs;Lift;Squat;Bend;Locomotion Level    Examination-Participation Restrictions  Church;Community Activity;Other    Rehab Potential  Good    PT Frequency  2x / week    PT Duration  8 weeks    PT  Treatment/Interventions  ADLs/Self Care Home Management;Cryotherapy;Electrical Stimulation;Moist Heat;Functional mobility training;Therapeutic activities;Therapeutic exercise;Patient/family education;Neuromuscular re-education;Manual techniques;Taping    PT Next Visit Plan  KX;  core lumbo/pelvic and hip strengthening    PT Home Exercise Plan   Access Code: [email or web address]        Patient will benefit  from skilled therapeutic intervention in order to improve the following deficits and impairments:  Pain, Difficulty walking, Decreased strength, Impaired perceived functional ability, Decreased activity tolerance, Decreased endurance, Decreased range of motion  Visit Diagnosis: 1. Muscle weakness (generalized)   2. Chronic left-sided low back pain with left-sided sciatica        Problem List There are no active problems to display for this patient. Ruben Im, PT 11/10/18 11:47 AM Phone: [phone] Fax: [phone] Alvera Singh 11/10/2018, 11:46 AM  [facility] [address], [place], [place], [address] Phone: [phone]   Fax:  [phone]  Name: Kevin Griffith MRN: [id number] Date of Birth: 08-15-1950

## 2018-11-15 ENCOUNTER — Ambulatory Visit: Payer: PPO | Admitting: Physical Therapy

## 2018-11-15 ENCOUNTER — Other Ambulatory Visit: Payer: Self-pay

## 2018-11-15 DIAGNOSIS — G8929 Other chronic pain: Secondary | ICD-10-CM

## 2018-11-15 DIAGNOSIS — M6281 Muscle weakness (generalized): Secondary | ICD-10-CM | POA: Diagnosis not present

## 2018-11-15 DIAGNOSIS — M5442 Lumbago with sciatica, left side: Secondary | ICD-10-CM

## 2018-11-15 NOTE — Therapy (Signed)
[facility] [address], [place], [place], [address] Phone: [phone]   Fax:  [phone]  Physical Therapy Treatment  Patient Details  Name: Kevin Griffith MRN: [id number] Date of Birth: September 30, 1950 Referring Provider (PT): Dr. Eustace Moore   Encounter Date: 11/15/2018  PT End of Session - 11/15/18 0958    Visit Number  15    Date for PT Re-Evaluation  11/22/18    Authorization Type  Health Team     PT Start Time  [phone]    PT Stop Time  1010    PT Time Calculation (min)  42 min       Past Medical History:  Diagnosis Date  . Asymptomatic microscopic hematuria   . Back pain, chronic   . Cervical stenosis of spine   . Colon polyps   . DDD (degenerative disc disease), cervical   . DDD (degenerative disc disease), lumbar   . Drug rash   . ED (erectile dysfunction)   . Hyperbilirubinemia   . Hypertriglyceridemia   . IFG (impaired fasting glucose)   . Insomnia disorder   . Lactose intolerance   . Mood change   . MRSA (methicillin resistant Staphylococcus aureus)   . Palpitations   . Paroxysmal A-fib ([place])   . Skin problem   . Sleep apnea, obstructive   . Vitamin D deficiency     Past Surgical History:  Procedure Laterality Date  . APPENDECTOMY  1970  . [place], 07-2001  . HEMIDISCECECTOMY      There were no vitals filed for this visit.  Subjective Assessment - 11/15/18 0929    Subjective  Had ozone hip  injection and I've been doing nothing since Thursday per their instructions.  I only slept a few hours last night b/c I stayed up too late.    Pertinent History  sleep apnea; leg length discrepancy; right shoulder surgery 2018 "working great" ;  PLIF L4-5 08/09/18    Patient Stated Goals  get hip flexors, gluteals built back up and leg stronger while gyms are closed ;  build up lats; build up support for spine; do dead lifts; get back to gym; dead lifts     Currently in Pain?  Yes    Pain  Score  2     Pain Location  Back    Pain Orientation  Left    Pain Type  Surgical pain                       [facility] Adult PT Treatment/Exercise - 11/15/18 0001      Lumbar Exercises: Aerobic   Nustep  L3-4 10 min    while discussing status and progress     Lumbar Exercises: Supine   Straight Leg Raises Limitations  green band Pilates style SLR with resisted hip extension 20x right/left     Isometric Hip Flexion Limitations  hand to opposite knee push 5 sec from blue ball     Other Supine Lumbar Exercises  blue ball rolls for hip flexion     Other Supine Lumbar Exercises  holding blue band handles with UE single extensions and diagonals        Lumbar Exercises: Sidelying   Clam  Left;15 reps      Lumbar Exercises: Prone   Other Prone Lumbar Exercises  multifidi press on pillow with hip extension 10x each;  HS curls with press 10x  Other Prone Lumbar Exercises  head and trunk lift 10x                PT Short Term Goals - 10/27/18 1121      PT SHORT TERM GOAL #1   Title  be independent in initial HEP appropriate for lumbar fusion    Status  Achieved      PT SHORT TERM GOAL #2   Title  The patient will report a 30% improvement in back and LE pain and symptoms with usual ADLs    Status  Achieved      PT SHORT TERM GOAL #3   Title  The patient will have improved bil hip flexor lengths to +5 degrees bilaterally needed for ease with walking longer distances > 1 mile    Status  Achieved        PT Long Term Goals - 10/27/18 1120      PT LONG TERM GOAL #1   Title  be independent in advanced HEP and return to gym program     Time  8    Period  Weeks    Status  On-going      PT LONG TERM GOAL #2   Title  reduce FOTO to < or = to 46% limitation    Status  Achieved      PT LONG TERM GOAL #3   Title  The patient will have at least 75% of lumbar flexion and extension ROM needed for future return to golf    Time  8    Period  Weeks    Status   On-going      PT LONG TERM GOAL #4   Title  The patient will have at least 4 to 4+/5 strength in bilateral LEs needed for climbing stairs and getting up off the floor    Time  8    Period  Weeks    Status  On-going      PT LONG TERM GOAL #5   Title  The patient will have 4+/5 core strength needed for lifting/carrying light to medium objects    Time  8    Period  Weeks    Status  On-going            Plan - 11/15/18 1013    Clinical Impression Statement  Decreased exercise intensity secondary to recent injection, lack of activity over the last several days and his report of generally not feeling well today secondary to poor sleep.  Increased left hip stiffness noted today but improved following gentle mobility ex.  Improving lower abdominal and trunk extensor strength although he fatigues quickly with prone head/trunk lifts and struggles to complete 10 reps.  Therapist closely monitoring response and modifying as needed for  hip discomfort or over fatigue of muscles.    Comorbidities  3 back surgeries; chronic history;  sleep apnea; multi joint degenerative changes;  shoulder surgery     Rehab Potential  Good    PT Frequency  2x / week    PT Duration  8 weeks    PT Treatment/Interventions  ADLs/Self Care Home Management;Cryotherapy;Electrical Stimulation;Moist Heat;Functional mobility training;Therapeutic activities;Therapeutic exercise;Patient/family education;Neuromuscular re-education;Manual techniques;Taping    PT Next Visit Plan  core lumbo/pelvic and hip strengthening;  KX;  hip mobility gentle; aerobic ex       Patient will benefit from skilled therapeutic intervention in order to improve the following deficits and impairments:  Pain, Difficulty walking, Decreased strength, Impaired perceived functional ability,  Decreased activity tolerance, Decreased endurance, Decreased range of motion  Visit Diagnosis: 1. Muscle weakness (generalized)   2. Chronic left-sided low back pain  with left-sided sciatica        Problem List There are no active problems to display for this patient. Ruben Im, PT 11/15/18 10:21 AM Phone: [phone] Fax: [phone] Alvera Singh 11/15/2018, 10:20 AM  [facility] [address], [place], [place], [address] Phone: [phone]   Fax:  [phone]  Name: Kevin Griffith MRN: [id number] Date of Birth: 08-20-1950

## 2018-11-18 ENCOUNTER — Ambulatory Visit: Payer: PPO | Admitting: Physical Therapy

## 2018-11-18 ENCOUNTER — Other Ambulatory Visit: Payer: Self-pay

## 2018-11-18 ENCOUNTER — Encounter: Payer: Self-pay | Admitting: Physical Therapy

## 2018-11-18 DIAGNOSIS — M6281 Muscle weakness (generalized): Secondary | ICD-10-CM

## 2018-11-18 DIAGNOSIS — M5442 Lumbago with sciatica, left side: Secondary | ICD-10-CM

## 2018-11-18 DIAGNOSIS — G8929 Other chronic pain: Secondary | ICD-10-CM

## 2018-11-18 NOTE — Therapy (Signed)
[facility] [address] [place], [place], [address] Phone: [phone]   Fax:  [phone]  Physical Therapy Treatment  Patient Details  Name: Kevin Griffith MRN: [id number] Date of Birth: 1950-08-04 Referring Provider (PT): Dr. Eustace Moore   Encounter Date: 11/18/2018  PT End of Session - 11/18/18 0830    Visit Number  16    Date for PT Re-Evaluation  11/22/18    Authorization Type  Health Team     PT Start Time  0827    PT Stop Time  0910    PT Time Calculation (min)  43 min    Activity Tolerance  Patient tolerated treatment well       Past Medical History:  Diagnosis Date  . Asymptomatic microscopic hematuria   . Back pain, chronic   . Cervical stenosis of spine   . Colon polyps   . DDD (degenerative disc disease), cervical   . DDD (degenerative disc disease), lumbar   . Drug rash   . ED (erectile dysfunction)   . Hyperbilirubinemia   . Hypertriglyceridemia   . IFG (impaired fasting glucose)   . Insomnia disorder   . Lactose intolerance   . Mood change   . MRSA (methicillin resistant Staphylococcus aureus)   . Palpitations   . Paroxysmal A-fib ([place])   . Skin problem   . Sleep apnea, obstructive   . Vitamin D deficiency     Past Surgical History:  Procedure Laterality Date  . APPENDECTOMY  1970  . [place], 07-2001  . HEMIDISCECECTOMY      There were no vitals filed for this visit.  Subjective Assessment - 11/18/18 0827    Subjective  I don't know how I'm doing yet.  I had trouble going to sleep last night.  It's been a week since my hip injection.  It's feeling better.  Only 1x of popping.  Walked a short distance yesterday.  Numbness in feet comes and goes.    Pertinent History  sleep apnea; leg length discrepancy; right shoulder surgery 2018 "working great" ;  PLIF L4-5 08/09/18    Patient Stated Goals  get hip flexors, gluteals built back up and leg stronger while gyms are  closed ;  build up lats; build up support for spine; do dead lifts; get back to gym; dead lifts     Currently in Pain?  No/denies    Pain Score  0-No pain         [facility] PT Assessment - 11/18/18 0001      Strength   Right Hip Flexion  4+/5    Right Hip Extension  4+/5    Right Hip ABduction  4/5    Left Hip Flexion  4/5    Left Hip Extension  4/5    Left Hip ABduction  4-/5                   [facility] Adult PT Treatment/Exercise - 11/18/18 0001      Lumbar Exercises: Aerobic   Nustep  L 4 LEs only seat 14 for 10 minutes; L0 LEs only 3 min       Lumbar Exercises: Machines for Strengthening   Leg Press  reclined seat 9 70# toe press;      Other Lumbar Machine Exercise  seated lat bar 30#; 35#, 45# 10 reps each     Other Lumbar Machine Exercise  seated rows 45# 2x10  Lumbar Exercises: Standing   Other Standing Lumbar Exercises  step ups on black foam 10x each side    Other Standing Lumbar Exercises  lateral step up with opposite hip abduction 10x right/left       Lumbar Exercises: Supine   Ab Set  10 reps    Other Supine Lumbar Exercises  holding LEs in table top with bil UE green band pulldowns    Other Supine Lumbar Exercises  holding UEs fixed at 90 degrees with green band anchored to side with LE marching2x 10       Lumbar Exercises: Prone   Other Prone Lumbar Exercises  head and trunk lift 10x                PT Short Term Goals - 10/27/18 1121      PT SHORT TERM GOAL #1   Title  be independent in initial HEP appropriate for lumbar fusion    Status  Achieved      PT SHORT TERM GOAL #2   Title  The patient will report a 30% improvement in back and LE pain and symptoms with usual ADLs    Status  Achieved      PT SHORT TERM GOAL #3   Title  The patient will have improved bil hip flexor lengths to +5 degrees bilaterally needed for ease with walking longer distances > 1 mile    Status  Achieved        PT Long Term Goals - 10/27/18 1120       PT LONG TERM GOAL #1   Title  be independent in advanced HEP and return to gym program     Time  8    Period  Weeks    Status  On-going      PT LONG TERM GOAL #2   Title  reduce FOTO to < or = to 46% limitation    Status  Achieved      PT LONG TERM GOAL #3   Title  The patient will have at least 75% of lumbar flexion and extension ROM needed for future return to golf    Time  8    Period  Weeks    Status  On-going      PT LONG TERM GOAL #4   Title  The patient will have at least 4 to 4+/5 strength in bilateral LEs needed for climbing stairs and getting up off the floor    Time  8    Period  Weeks    Status  On-going      PT LONG TERM GOAL #5   Title  The patient will have 4+/5 core strength needed for lifting/carrying light to medium objects    Time  8    Period  Weeks    Status  On-going            Plan - 11/18/18 4854    Clinical Impression Statement  The patient is limited with left hip mobility and intermittent groin discomfort however he is able to participate in a progression of core and LE strengthening.  He continues to put forth good effort and will often do more reps than requested.  Therapist closely monitoring response throughout treatment session and modifying positions as needed for hip pain.    Comorbidities  3 back surgeries; chronic history;  sleep apnea; multi joint degenerative changes;  shoulder surgery     Rehab Potential  Good    PT Frequency  2x /  week    PT Duration  8 weeks    PT Treatment/Interventions  ADLs/Self Care Home Management;Cryotherapy;Electrical Stimulation;Moist Heat;Functional mobility training;Therapeutic activities;Therapeutic exercise;Patient/family education;Neuromuscular re-education;Manual techniques;Taping    PT Next Visit Plan  core lumbo/pelvic and hip strengthening;  KX;  hip mobility gentle; aerobic ex    PT Home Exercise Plan   Access Code: [email or web address]        Patient will benefit from skilled therapeutic intervention in  order to improve the following deficits and impairments:  Pain, Difficulty walking, Decreased strength, Impaired perceived functional ability, Decreased activity tolerance, Decreased endurance, Decreased range of motion  Visit Diagnosis: 1. Muscle weakness (generalized)   2. Chronic left-sided low back pain with left-sided sciatica        Problem List There are no active problems to display for this patient.  Ruben Im, PT 11/18/18 9:22 AM Phone: [phone] Fax: [phone] Alvera Singh 11/18/2018, Sandi Mariscal AM  [facility] [address], [place], [place], [address] Phone: [phone]   Fax:  [phone]  Name: Kevin Griffith MRN: [id number] Date of Birth: Apr 13, 1951

## 2018-11-22 ENCOUNTER — Encounter: Payer: Self-pay | Admitting: Physical Therapy

## 2018-11-22 ENCOUNTER — Ambulatory Visit: Payer: PPO | Admitting: Physical Therapy

## 2018-11-22 ENCOUNTER — Other Ambulatory Visit: Payer: Self-pay

## 2018-11-22 DIAGNOSIS — G8929 Other chronic pain: Secondary | ICD-10-CM

## 2018-11-22 DIAGNOSIS — M6281 Muscle weakness (generalized): Secondary | ICD-10-CM | POA: Diagnosis not present

## 2018-11-22 NOTE — Therapy (Signed)
[facility] [address] [place], [place], [address] Phone: [phone]   Fax:  [phone]  Physical Therapy Treatment/Recertification   Patient Details  Name: Kevin Griffith MRN: [id number] Date of Birth: 18-Nov-1950 Referring Provider (PT): Dr. Eustace Moore   Encounter Date: 11/22/2018  PT End of Session - 11/22/18 1157    Visit Number  17    Date for PT Re-Evaluation  01/03/19    Authorization Type  Health Team     PT Start Time  [phone]    PT Stop Time  0912    PT Time Calculation (min)  46 min    Activity Tolerance  Patient tolerated treatment well       Past Medical History:  Diagnosis Date  . Asymptomatic microscopic hematuria   . Back pain, chronic   . Cervical stenosis of spine   . Colon polyps   . DDD (degenerative disc disease), cervical   . DDD (degenerative disc disease), lumbar   . Drug rash   . ED (erectile dysfunction)   . Hyperbilirubinemia   . Hypertriglyceridemia   . IFG (impaired fasting glucose)   . Insomnia disorder   . Lactose intolerance   . Mood change   . MRSA (methicillin resistant Staphylococcus aureus)   . Palpitations   . Paroxysmal A-fib ([place])   . Skin problem   . Sleep apnea, obstructive   . Vitamin D deficiency     Past Surgical History:  Procedure Laterality Date  . APPENDECTOMY  1970  . [place], 07-2001  . HEMIDISCECECTOMY      There were no vitals filed for this visit.  Subjective Assessment - 11/22/18 0828    Subjective  I think I crossed over yesterday,  walked a mile then walked 1/4 mile every hour with walking poles.  I couldn't carry my wife's suitcase up the steps.    Pertinent History  sleep apnea; leg length discrepancy; right shoulder surgery 2018 "working great" ;  PLIF L4-5 08/09/18    How long can you walk comfortably?  1000 feet to 1 mile with cramping lower lumbar     Currently in Pain?  No/denies    Pain Score  0-No pain          [facility] PT Assessment - 11/22/18 0001      Assessment   Medical Diagnosis  PLIF L4-5 fusion    Referring Provider (PT)  Dr. Eustace Moore    Onset Date/Surgical Date  08/09/18    Next MD Visit  12/23/18    Prior Therapy  for shoulder       Observation/Other Assessments   Focus on Therapeutic Outcomes (FOTO)   37% limitation       AROM   Overall AROM Comments  hip external rotation 40 d    Lumbar Flexion  40    Lumbar Extension  10    Lumbar - Right Side Bend  25    Lumbar - Left Side Bend  25      Strength   Strength Assessment Site  --   3x 10 sec on left;  able to rise from low chair without UE    Lumbar Flexion  4-/5    Lumbar Extension  4-/5                   [facility] Adult PT Treatment/Exercise - 11/22/18 0001      Lumbar Exercises: Stretches   Other  Lumbar Stretch Exercise  attempted over the side of the bed hip flexor stretch but discontinued too painful on left hip     Other Lumbar Stretch Exercise  right KTC with left leg straight on table to gentle stretch hip flexors       Lumbar Exercises: Aerobic   Nustep  L 4 LEs only seat 14 for 10 minutes; L0 LEs only 3 min       Lumbar Exercises: Seated   Other Seated Lumbar Exercises  neural flossing 10x right/left       Lumbar Exercises: Supine   Other Supine Lumbar Exercises  neural gliding knee flex extend 10x; ankle pumps with bent knee 10x     Other Supine Lumbar Exercises  patient demonstrates his "favorite home stretches"  therapist observed and offered technique and symptom check to determine appropriateness                PT Short Term Goals - 11/22/18 1201      PT SHORT TERM GOAL #1   Title  be independent in initial HEP appropriate for lumbar fusion    Status  Achieved      PT SHORT TERM GOAL #2   Title  The patient will report a 30% improvement in back and LE pain and symptoms with usual ADLs    Status  Achieved      PT SHORT TERM GOAL #3   Title  The patient will have  improved bil hip flexor lengths to +5 degrees bilaterally needed for ease with walking longer distances > 1 mile    Status  Achieved        PT Long Term Goals - 11/22/18 1202      PT LONG TERM GOAL #1   Title  be independent in advanced HEP and return to gym program     Time  6    Period  Weeks    Status  On-going    Target Date  01/03/19      PT LONG TERM GOAL #2   Title  reduce FOTO to < or = to 46% limitation    Status  Achieved      PT LONG TERM GOAL #3   Title  The patient will have at least 75% of lumbar flexion and extension ROM needed for future return to golf    Time  6    Period  Weeks    Status  On-going      PT LONG TERM GOAL #4   Title  The patient will have at least 4 to 4+/5 strength in bilateral LEs needed for climbing stairs with an object and getting up off the floor    Time  8    Period  Weeks    Status  On-going      PT LONG TERM GOAL #5   Title  The patient will have 4+/5 core strength needed for lifting/carrying light to medium objects    Time  8    Period  Weeks    Status  On-going      Additional Long Term Goals   Additional Long Term Goals  Yes      PT LONG TERM GOAL #6   Title  The patient will have trunk and hip extensor strength needed to stand upright and walk medium distances without walking poles    Time  6    Period  Weeks    Status  New  Plan - 11/22/18 1029    Clinical Impression Statement  The patient is progressing with PT and has much improved walking distance and return to function as well as general strength but continues to be limited by left hip pain, hypomobility in upper lumbar and hip and decreased neural mobility.  He also complains of decreased LE strength making it difficult to carry a suitcase upstairs and requiring the use of walking poles with long distance walking to keep upright posture.  He would benefit from a progression of strengthening and mobility exercises for another month.  He requires close  supervision to monitor ex technique and to avoid "overdoing it" which will exacerbate his pain and LE paresthesia.  Partial goals met and he should meet remaining goals in the next 4-5 weeks.    Comorbidities  3 back surgeries; chronic history;  sleep apnea; multi joint degenerative changes;  shoulder surgery     Examination-Activity Limitations  Stairs;Lift;Squat;Bend;Locomotion Level    Examination-Participation Restrictions  Church;Community Activity;Other    Rehab Potential  Good    PT Frequency  2x / week    PT Treatment/Interventions  ADLs/Self Care Home Management;Cryotherapy;Electrical Stimulation;Moist Heat;Functional mobility training;Therapeutic activities;Therapeutic exercise;Patient/family education;Neuromuscular re-education;Manual techniques;Taping    PT Next Visit Plan  core lumbo/pelvic and hip strengthening;  KX;  hip mobility gentle; aerobic ex;  neural flossing    PT Home Exercise Plan   Access Code: [email or web address]        Patient will benefit from skilled therapeutic intervention in order to improve the following deficits and impairments:  Pain, Difficulty walking, Decreased strength, Impaired perceived functional ability, Decreased activity tolerance, Decreased endurance, Decreased range of motion  Visit Diagnosis: 1. Muscle weakness (generalized)   2. Chronic left-sided low back pain with left-sided sciatica        Problem List There are no active problems to display for this patient.  Ruben Im, PT 11/22/18 12:07 PM Phone: [phone] Fax: [phone] Alvera Singh 11/22/2018, 12:06 PM  [facility] [address] [place], [place], [address] Phone: [phone]   Fax:  [phone]  Name: Kevin Griffith MRN: [id number] Date of Birth: December 18, 1950

## 2018-11-24 ENCOUNTER — Encounter: Payer: Self-pay | Admitting: Physical Therapy

## 2018-11-24 ENCOUNTER — Ambulatory Visit: Payer: PPO | Admitting: Physical Therapy

## 2018-11-24 ENCOUNTER — Other Ambulatory Visit: Payer: Self-pay

## 2018-11-24 DIAGNOSIS — M6281 Muscle weakness (generalized): Secondary | ICD-10-CM | POA: Diagnosis not present

## 2018-11-24 DIAGNOSIS — G8929 Other chronic pain: Secondary | ICD-10-CM

## 2018-11-24 NOTE — Therapy (Signed)
[facility] [address], [place], [place], [address] Phone: [phone]   Fax:  [phone]  Physical Therapy Treatment  Patient Details  Name: MARKEY DEADY MRN: [id number] Date of Birth: 04-Aug-1950 Referring Provider (PT): Dr. Eustace Moore   Encounter Date: 11/24/2018  PT End of Session - 11/24/18 0911    Visit Number  18    Date for PT Re-Evaluation  01/03/19    Authorization Type  Health Team     PT Start Time  0827    PT Stop Time  0912    PT Time Calculation (min)  45 min    Activity Tolerance  Patient tolerated treatment well       Past Medical History:  Diagnosis Date  . Asymptomatic microscopic hematuria   . Back pain, chronic   . Cervical stenosis of spine   . Colon polyps   . DDD (degenerative disc disease), cervical   . DDD (degenerative disc disease), lumbar   . Drug rash   . ED (erectile dysfunction)   . Hyperbilirubinemia   . Hypertriglyceridemia   . IFG (impaired fasting glucose)   . Insomnia disorder   . Lactose intolerance   . Mood change   . MRSA (methicillin resistant Staphylococcus aureus)   . Palpitations   . Paroxysmal A-fib ([place])   . Skin problem   . Sleep apnea, obstructive   . Vitamin D deficiency     Past Surgical History:  Procedure Laterality Date  . APPENDECTOMY  1970  . [place], 07-2001  . HEMIDISCECECTOMY      There were no vitals filed for this visit.  Subjective Assessment - 11/24/18 0827    Subjective  Feeling better than yesterday.  It's been an interesting couple of days.  That nerve floss exercise and lying on my stomach on the pool lounge makes my feet go numb.  I can hardly walk if I'm barefooted.  It throws my balance off.   I had general malaise which I think was from an exposure to some toxic chemicals from the disinfectant at the pool.   I feel good today though.    Pertinent History  sleep apnea; leg length discrepancy; right shoulder  surgery 2018 "working great" ;  PLIF L4-5 08/09/18    Patient Stated Goals  get hip flexors, gluteals built back up and leg stronger while gyms are closed ;  build up lats; build up support for spine; do dead lifts; get back to gym; dead lifts     Currently in Pain?  Yes    Pain Score  1     Pain Location  Hip    Pain Orientation  Left;Lateral    Pain Descriptors / Indicators  Sore                       [facility] Adult PT Treatment/Exercise - 11/24/18 0001      Lumbar Exercises: Stretches   Other Lumbar Stretch Exercise  slant board gastroc stretch 5x right/left       Lumbar Exercises: Aerobic   Nustep  L 4 LEs only seat 14 for 11 while discussing status       Lumbar Exercises: Standing   Shoulder Extension  Strengthening;20 reps;Theraband    Theraband Level (Shoulder Extension)  Level 3 (Green)    Shoulder Extension Limitations  standing on black foam    Other Standing Lumbar Exercises  rocker board front back  and side to side 2 min each     Other Standing Lumbar Exercises  wall pull aways using anterior tibilias 25x      Lumbar Exercises: Supine   Straight Leg Raise  15 reps    Straight Leg Raises Limitations  right/left pulses     Other Supine Lumbar Exercises  hooklying green ball on abdomen UE and LE isometric pushes into ball  straight and diagonals 3x10      Knee/Hip Exercises: Standing   Hip Extension  Right;Left;20 reps    Extension Limitations  green band small pulses                PT Short Term Goals - 11/22/18 1201      PT SHORT TERM GOAL #1   Title  be independent in initial HEP appropriate for lumbar fusion    Status  Achieved      PT SHORT TERM GOAL #2   Title  The patient will report a 30% improvement in back and LE pain and symptoms with usual ADLs    Status  Achieved      PT SHORT TERM GOAL #3   Title  The patient will have improved bil hip flexor lengths to +5 degrees bilaterally needed for ease with walking longer distances > 1  mile    Status  Achieved        PT Long Term Goals - 11/22/18 1202      PT LONG TERM GOAL #1   Title  be independent in advanced HEP and return to gym program     Time  6    Period  Weeks    Status  On-going    Target Date  01/03/19      PT LONG TERM GOAL #2   Title  reduce FOTO to < or = to 46% limitation    Status  Achieved      PT LONG TERM GOAL #3   Title  The patient will have at least 75% of lumbar flexion and extension ROM needed for future return to golf    Time  6    Period  Weeks    Status  On-going      PT LONG TERM GOAL #4   Title  The patient will have at least 4 to 4+/5 strength in bilateral LEs needed for climbing stairs with an object and getting up off the floor    Time  8    Period  Weeks    Status  On-going      PT LONG TERM GOAL #5   Title  The patient will have 4+/5 core strength needed for lifting/carrying light to medium objects    Time  8    Period  Weeks    Status  On-going      Additional Long Term Goals   Additional Long Term Goals  Yes      PT LONG TERM GOAL #6   Title  The patient will have trunk and hip extensor strength needed to stand upright and walk medium distances without walking poles    Time  6    Period  Weeks    Status  New            Plan - 11/24/18 0912    Clinical Impression Statement  The patient complains of intermittent cramping in toe muscles during standing ex's but following treatment session he reports his feet are "working better."  Treatment modified for a lower  intensity secondary to reports of general malaise the last 2 days and possible neural irritation.  Will hold on neural flossing and gliding temporarily.  More erect posture and gait speed following treatment session.    Personal Factors and Comorbidities  Age;Comorbidity 1;Comorbidity 2;Comorbidity 3+;Time since onset of injury/illness/exacerbation    Comorbidities  3 back surgeries; chronic history;  sleep apnea; multi joint degenerative changes;   shoulder surgery     Rehab Potential  Good    PT Frequency  2x / week    PT Duration  8 weeks    PT Treatment/Interventions  ADLs/Self Care Home Management;Cryotherapy;Electrical Stimulation;Moist Heat;Functional mobility training;Therapeutic activities;Therapeutic exercise;Patient/family education;Neuromuscular re-education;Manual techniques;Taping    PT Next Visit Plan  core lumbo/pelvic and hip strengthening;  KX;  hip mobility gentle; aerobic ex;  ankle/foot intrinsic strengthening    PT Home Exercise Plan   Access Code: [email or web address]        Patient will benefit from skilled therapeutic intervention in order to improve the following deficits and impairments:  Pain, Difficulty walking, Decreased strength, Impaired perceived functional ability, Decreased activity tolerance, Decreased endurance, Decreased range of motion  Visit Diagnosis: 1. Muscle weakness (generalized)   2. Chronic left-sided low back pain with left-sided sciatica        Problem List There are no active problems to display for this patient.  Ruben Im, PT 11/24/18 9:24 AM Phone: [phone] Fax: [phone] Alvera Singh 11/24/2018, 9:23 AM  [facility] [address] [place], [place], [address] Phone: [phone]   Fax:  [phone]  Name: JUDSON TSAN MRN: [id number] Date of Birth: 1950/08/03

## 2018-11-29 ENCOUNTER — Ambulatory Visit: Payer: PPO | Attending: Neurological Surgery | Admitting: Physical Therapy

## 2018-11-29 ENCOUNTER — Encounter: Payer: Self-pay | Admitting: Physical Therapy

## 2018-11-29 ENCOUNTER — Other Ambulatory Visit: Payer: Self-pay

## 2018-11-29 DIAGNOSIS — M5442 Lumbago with sciatica, left side: Secondary | ICD-10-CM | POA: Insufficient documentation

## 2018-11-29 DIAGNOSIS — M6281 Muscle weakness (generalized): Secondary | ICD-10-CM | POA: Diagnosis not present

## 2018-11-29 DIAGNOSIS — M25611 Stiffness of right shoulder, not elsewhere classified: Secondary | ICD-10-CM | POA: Insufficient documentation

## 2018-11-29 DIAGNOSIS — G8929 Other chronic pain: Secondary | ICD-10-CM | POA: Diagnosis not present

## 2018-11-29 DIAGNOSIS — M25511 Pain in right shoulder: Secondary | ICD-10-CM | POA: Diagnosis not present

## 2018-11-29 NOTE — Therapy (Signed)
[facility] [address], [place], [place], [address] Phone: [phone]   Fax:  [phone]  Physical Therapy Treatment  Patient Details  Name: Kevin Griffith MRN: [id number] Date of Birth: 1950/12/18 Referring Provider (PT): Dr. Eustace Moore   Encounter Date: 11/29/2018  PT End of Session - 11/29/18 0843    Visit Number  19    Date for PT Re-Evaluation  01/03/19    Authorization Type  Health Team     PT Start Time  [phone]    PT Stop Time  0926    PT Time Calculation (min)  44 min    Activity Tolerance  Patient tolerated treatment well    Behavior During Therapy  [facility] for tasks assessed/performed       Past Medical History:  Diagnosis Date  . Asymptomatic microscopic hematuria   . Back pain, chronic   . Cervical stenosis of spine   . Colon polyps   . DDD (degenerative disc disease), cervical   . DDD (degenerative disc disease), lumbar   . Drug rash   . ED (erectile dysfunction)   . Hyperbilirubinemia   . Hypertriglyceridemia   . IFG (impaired fasting glucose)   . Insomnia disorder   . Lactose intolerance   . Mood change   . MRSA (methicillin resistant Staphylococcus aureus)   . Palpitations   . Paroxysmal A-fib ([place])   . Skin problem   . Sleep apnea, obstructive   . Vitamin D deficiency     Past Surgical History:  Procedure Laterality Date  . APPENDECTOMY  1970  . [place], 07-2001  . HEMIDISCECECTOMY      There were no vitals filed for this visit.  Subjective Assessment - 11/29/18 0844    Subjective  I over ate this weekend and as my belly expanded it increased the pressure on my L5 n root. This AM I am better.    Pertinent History  sleep apnea; leg length discrepancy; right shoulder surgery 2018 "working great" ;  PLIF L4-5 08/09/18    Limitations  Walking;House hold activities    Currently in Pain?  Yes    Pain Score  1     Pain Location  Hip    Pain Orientation  Left;Lateral    Pain Descriptors / Indicators  Sore    Aggravating Factors   Doing too much    Pain Relieving Factors  Getting stronger    Multiple Pain Sites  No                       [facility] Adult PT Treatment/Exercise - 11/29/18 0001      Lumbar Exercises: Stretches   Lower Trunk Rotation  2 reps;20 seconds    Lower Trunk Rotation Limitations  PTA pelvic depression on LT     Other Lumbar Stretch Exercise  slant board gastroc stretch 5x right/left    PTA assist in post tilt when holding     Lumbar Exercises: Aerobic   Nustep  L 4 LEs only seat 14 for 11 while discussing status       Lumbar Exercises: Seated   Other Seated Lumbar Exercises  Lat pull machine 45# 10x 2: rows 45# 2x10      Lumbar Exercises: Supine   Bent Knee Raise  5 reps;5 seconds    Bridge Limitations  Small hover with arm press 10x 5 sec hold: VC to engage abdominals    Other  Supine Lumbar Exercises  Pilates single leg circles ea dir 6x2  Bil       Lumbar Exercises: Sidelying   Hip Abduction Limitations  bent knee : hold 5 sec 5x               PT Short Term Goals - 11/22/18 1201      PT SHORT TERM GOAL #1   Title  be independent in initial HEP appropriate for lumbar fusion    Status  Achieved      PT SHORT TERM GOAL #2   Title  The patient will report a 30% improvement in back and LE pain and symptoms with usual ADLs    Status  Achieved      PT SHORT TERM GOAL #3   Title  The patient will have improved bil hip flexor lengths to +5 degrees bilaterally needed for ease with walking longer distances > 1 mile    Status  Achieved        PT Long Term Goals - 11/22/18 1202      PT LONG TERM GOAL #1   Title  be independent in advanced HEP and return to gym program     Time  6    Period  Weeks    Status  On-going    Target Date  01/03/19      PT LONG TERM GOAL #2   Title  reduce FOTO to < or = to 46% limitation    Status  Achieved      PT LONG TERM GOAL #3   Title  The patient will have at  least 75% of lumbar flexion and extension ROM needed for future return to golf    Time  6    Period  Weeks    Status  On-going      PT LONG TERM GOAL #4   Title  The patient will have at least 4 to 4+/5 strength in bilateral LEs needed for climbing stairs with an object and getting up off the floor    Time  8    Period  Weeks    Status  On-going      PT LONG TERM GOAL #5   Title  The patient will have 4+/5 core strength needed for lifting/carrying light to medium objects    Time  8    Period  Weeks    Status  On-going      Additional Long Term Goals   Additional Long Term Goals  Yes      PT LONG TERM GOAL #6   Title  The patient will have trunk and hip extensor strength needed to stand upright and walk medium distances without walking poles    Time  6    Period  Weeks    Status  New            Plan - 11/29/18 1749    Clinical Impression Statement  Pt presents today feeling "pretty good." Treatment focused primarily on core stabilizatio and pt requested Lt hip strenthening. Pt had no pain or hip popping with the exercises. Keeping pt out of anterior pelvic tilt with most exercises "feels good" to the back per pt report.    Personal Factors and Comorbidities  Age;Comorbidity 1;Comorbidity 2;Comorbidity 3+;Time since onset of injury/illness/exacerbation    Comorbidities  3 back surgeries; chronic history;  sleep apnea; multi joint degenerative changes;  shoulder surgery     Examination-Activity Limitations  Stairs;Lift;Squat;Bend;Locomotion Level    Examination-Participation  Restrictions  Church;Community Activity;Other    Stability/Clinical Decision Making  Evolving/Moderate complexity    Rehab Potential  Good    PT Frequency  2x / week    PT Duration  8 weeks    PT Treatment/Interventions  ADLs/Self Care Home Management;Cryotherapy;Electrical Stimulation;Moist Heat;Functional mobility training;Therapeutic activities;Therapeutic exercise;Patient/family education;Neuromuscular  re-education;Manual techniques;Taping    PT Next Visit Plan  core lumbo/pelvic and hip strengthening;  KX;  hip mobility gentle; aerobic ex;  ankle/foot intrinsic strengthening    PT Home Exercise Plan   Access Code: [email or web address]     Consulted and Agree with Plan of Care  Patient       Patient will benefit from skilled therapeutic intervention in order to improve the following deficits and impairments:  Pain, Difficulty walking, Decreased strength, Impaired perceived functional ability, Decreased activity tolerance, Decreased endurance, Decreased range of motion  Visit Diagnosis: 1. Muscle weakness (generalized)   2. Chronic left-sided low back pain with left-sided sciatica   3. Acute pain of right shoulder   4. Stiffness of right shoulder, not elsewhere classified        Problem List There are no active problems to display for this patient.   Izaya Netherton,PTA 11/29/2018, 9:31 AM  [facility] [address] [place], [place], [address] Phone: [phone]   Fax:  [phone]  Name: MANLY NESTLE MRN: [id number] Date of Birth: Dec 20, 1950

## 2018-12-02 ENCOUNTER — Ambulatory Visit: Payer: PPO | Admitting: Physical Therapy

## 2018-12-02 ENCOUNTER — Other Ambulatory Visit: Payer: Self-pay

## 2018-12-02 ENCOUNTER — Encounter: Payer: Self-pay | Admitting: Physical Therapy

## 2018-12-02 DIAGNOSIS — G8929 Other chronic pain: Secondary | ICD-10-CM

## 2018-12-02 DIAGNOSIS — M6281 Muscle weakness (generalized): Secondary | ICD-10-CM

## 2018-12-02 DIAGNOSIS — M5442 Lumbago with sciatica, left side: Secondary | ICD-10-CM

## 2018-12-02 NOTE — Therapy (Signed)
[facility] [address], [address], [place], [address] Phone: [phone]   Fax:  [phone]  Physical Therapy Treatment  Patient Details  Name: Kevin Griffith MRN: [id number] Date of Birth: 09/21/1950 Referring Provider (PT): Dr. Eustace Moore  Progress Note Reporting Period 10/27/18 to 12/02/18  See note below for Objective Data and Assessment of Progress/Goals.      Encounter Date: 12/02/2018  PT End of Session - 12/02/18 0852    Visit Number  20    Date for PT Re-Evaluation  01/03/19    Authorization Type  Health Team     PT Start Time  [phone]    PT Stop Time  [phone]    PT Time Calculation (min)  42 min    Activity Tolerance  Patient tolerated treatment well       Past Medical History:  Diagnosis Date  . Asymptomatic microscopic hematuria   . Back pain, chronic   . Cervical stenosis of spine   . Colon polyps   . DDD (degenerative disc disease), cervical   . DDD (degenerative disc disease), lumbar   . Drug rash   . ED (erectile dysfunction)   . Hyperbilirubinemia   . Hypertriglyceridemia   . IFG (impaired fasting glucose)   . Insomnia disorder   . Lactose intolerance   . Mood change   . MRSA (methicillin resistant Staphylococcus aureus)   . Palpitations   . Paroxysmal A-fib ([place])   . Skin problem   . Sleep apnea, obstructive   . Vitamin D deficiency     Past Surgical History:  Procedure Laterality Date  . APPENDECTOMY  1970  . [place], 07-2001  . HEMIDISCECECTOMY      There were no vitals filed for this visit.  Subjective Assessment - 12/02/18 0817    Subjective  I had a weird night.  The storm woke me up and I couldn't go back to sleep.  I felt like electricity was running through my feet.  The ligaments and muscles on the side of my hip are noticeable.    Pertinent History  sleep apnea; leg length discrepancy; right shoulder surgery 2018 "working great" ;  PLIF L4-5 08/09/18     Patient Stated Goals  get hip flexors, gluteals built back up and leg stronger while gyms are closed ;  build up lats; build up support for spine; do dead lifts; get back to gym; dead lifts     Currently in Pain?  Yes    Pain Score  3     Pain Location  Hip    Pain Orientation  Left    Pain Type  Surgical pain         [facility] PT Assessment - 12/02/18 0001      Observation/Other Assessments   Focus on Therapeutic Outcomes (FOTO)   37% limitation       AROM   Overall AROM Comments  hip external rotation 40 d    Lumbar Flexion  40    Lumbar Extension  10    Lumbar - Right Side Bend  25    Lumbar - Left Side Bend  25      Strength   Right Hip Flexion  4+/5    Right Hip Extension  4+/5    Right Hip ABduction  4/5    Left Hip Flexion  4/5    Left Hip Extension  4/5    Left Hip ABduction  4-/5  Lumbar Flexion  4-/5    Lumbar Extension  4-/5                   [facility] Adult PT Treatment/Exercise - 12/02/18 0001      Lumbar Exercises: Stretches   Piriformis Stretch  Left;30 seconds    Other Lumbar Stretch Exercise  thoracic extension in sitting and supine    Other Lumbar Stretch Exercise  right KTC with left leg straight on table to gentle stretch hip flexors       Lumbar Exercises: Aerobic   Nustep  L5 13 min LEs only      Lumbar Exercises: Standing   Other Standing Lumbar Exercises  35# retro stepping 15x staggered standing 20x each     Other Standing Lumbar Exercises  35# standing lat pull downs 30x staggered standing       Lumbar Exercises: Seated   Sit to Stand  20 reps    Sit to Stand Limitations  10# weight hold      Lumbar Exercises: Supine   Clam  20 reps    Clam Limitations  blue band double and single    with abdominal draw in              PT Short Term Goals - 11/22/18 1201      PT SHORT TERM GOAL #1   Title  be independent in initial HEP appropriate for lumbar fusion    Status  Achieved      PT SHORT TERM GOAL #2   Title  The  patient will report a 30% improvement in back and LE pain and symptoms with usual ADLs    Status  Achieved      PT SHORT TERM GOAL #3   Title  The patient will have improved bil hip flexor lengths to +5 degrees bilaterally needed for ease with walking longer distances > 1 mile    Status  Achieved        PT Long Term Goals - 12/02/18 1733      PT LONG TERM GOAL #1   Title  be independent in advanced HEP and return to gym program     Time  6    Status  On-going      PT LONG TERM GOAL #2   Title  reduce FOTO to < or = to 46% limitation    Status  Achieved      PT LONG TERM GOAL #3   Title  The patient will have at least 75% of lumbar flexion and extension ROM needed for future return to golf    Time  6    Period  Weeks    Status  On-going      PT LONG TERM GOAL #4   Title  The patient will have at least 4 to 4+/5 strength in bilateral LEs needed for climbing stairs with an object and getting up off the floor    Time  8    Period  Weeks    Status  On-going      PT LONG TERM GOAL #5   Title  The patient will have 4+/5 core strength needed for lifting/carrying light to medium objects    Time  8    Period  Weeks    Status  On-going      PT LONG TERM GOAL #6   Title  The patient will have trunk and hip extensor strength needed to stand upright and walk medium distances  without walking poles    Time  6    Period  Weeks    Status  On-going            Plan - 12/02/18 7048    Clinical Impression Statement  The patient reports increased pain in hip flexor region recently most likely from overuse/compensatory strategy for left hip extensor weakness.   Treatment focus on gluteal medius and maximus strengthening and gentle left hip flexor stretching.  Therapist closely monitoring response and modifying as needed.  Patient is progressing well and should meet remaining rehab goals in the next 3 weeks.  Progress has been slowed by comorbidities including hip OA and previous back  surgeries.    Comorbidities  3 back surgeries; chronic history;  sleep apnea; multi joint degenerative changes;  shoulder surgery     Examination-Participation Restrictions  Church;Community Activity;Other    Stability/Clinical Decision Making  Evolving/Moderate complexity    Rehab Potential  Good    PT Frequency  2x / week    PT Duration  8 weeks    PT Treatment/Interventions  ADLs/Self Care Home Management;Cryotherapy;Electrical Stimulation;Moist Heat;Functional mobility training;Therapeutic activities;Therapeutic exercise;Patient/family education;Neuromuscular re-education;Manual techniques;Taping    PT Next Visit Plan  core lumbo/pelvic and hip strengthening;  KX;  gentle hip flexor stretch    PT Home Exercise Plan   Access Code: [email or web address]        Patient will benefit from skilled therapeutic intervention in order to improve the following deficits and impairments:  Pain, Difficulty walking, Decreased strength, Impaired perceived functional ability, Decreased activity tolerance, Decreased endurance, Decreased range of motion  Visit Diagnosis: 1. Muscle weakness (generalized)   2. Chronic left-sided low back pain with left-sided sciatica        Problem List There are no active problems to display for this patient.  Ruben Im, PT 12/02/18 5:35 PM Phone: [phone] Fax: [phone] Alvera Singh 12/02/2018, 5:35 PM  [facility] [address] [place], [place], [address] Phone: [phone]   Fax:  [phone]  Name: LADAMIEN RAMMEL MRN: [id number] Date of Birth: 11-20-1950

## 2018-12-07 ENCOUNTER — Ambulatory Visit: Payer: PPO | Admitting: Physical Therapy

## 2018-12-07 ENCOUNTER — Encounter: Payer: Self-pay | Admitting: Physical Therapy

## 2018-12-07 ENCOUNTER — Other Ambulatory Visit: Payer: Self-pay

## 2018-12-07 DIAGNOSIS — G8929 Other chronic pain: Secondary | ICD-10-CM

## 2018-12-07 DIAGNOSIS — M6281 Muscle weakness (generalized): Secondary | ICD-10-CM | POA: Diagnosis not present

## 2018-12-07 NOTE — Therapy (Signed)
[facility] [address], [place], [place], [address] Phone: [phone]   Fax:  [phone]  Physical Therapy Treatment  Patient Details  Name: Kevin Griffith MRN: [id number] Date of Birth: 01-02-1951 Referring Provider (PT): Dr. Eustace Moore   Encounter Date: 12/07/2018  PT End of Session - 12/07/18 1212    Visit Number  21    Date for PT Re-Evaluation  01/03/19    Authorization Type  Health Team     PT Start Time  1212    PT Stop Time  1300    PT Time Calculation (min)  48 min    Activity Tolerance  Patient tolerated treatment well       Past Medical History:  Diagnosis Date  . Asymptomatic microscopic hematuria   . Back pain, chronic   . Cervical stenosis of spine   . Colon polyps   . DDD (degenerative disc disease), cervical   . DDD (degenerative disc disease), lumbar   . Drug rash   . ED (erectile dysfunction)   . Hyperbilirubinemia   . Hypertriglyceridemia   . IFG (impaired fasting glucose)   . Insomnia disorder   . Lactose intolerance   . Mood change   . MRSA (methicillin resistant Staphylococcus aureus)   . Palpitations   . Paroxysmal A-fib ([place])   . Skin problem   . Sleep apnea, obstructive   . Vitamin D deficiency     Past Surgical History:  Procedure Laterality Date  . APPENDECTOMY  1970  . [place], 07-2001  . HEMIDISCECECTOMY      There were no vitals filed for this visit.  Subjective Assessment - 12/07/18 1211    Subjective  I had an episode on Sunday.  I slept in the recliner with a cushion vertically along my spine for 6 hours watching golf.  Both of my legs went numb.  I could hardly walk afterwards.  Those places next to my spine haven't filled in.  I shouldn't sit for more than 30 minutes at a time.  It was bothering me this morning but I'm better now after stretching and a walk.  Another hip injection next week.  The back bothers me with going up steps.    Pertinent  History  sleep apnea; leg length discrepancy; right shoulder surgery 2018 "working great" ;  PLIF L4-5 08/09/18    Patient Stated Goals  get hip flexors, gluteals built back up and leg stronger while gyms are closed ;  build up lats; build up support for spine; do dead lifts; get back to gym; dead lifts     Currently in Pain?  Yes    Pain Score  2     Pain Location  Back                       [facility] Adult PT Treatment/Exercise - 12/07/18 0001      Therapeutic Activites    ADL's  dead lifting 30# bar 2x 10 to upper shin level       Lumbar Exercises: Aerobic   Nustep  L6 10 min LEs only       Lumbar Exercises: Standing   Other Standing Lumbar Exercises  30# landmines staggered standing 2x 10 right/left     Other Standing Lumbar Exercises  35# backwards and forwards walking 10x each       Lumbar Exercises: Supine   Clam  20 reps  Clam Limitations  blue band double and single    with abdominal draw in     Lumbar Exercises: Prone   Other Prone Lumbar Exercises  trunk extension with therapist holding LEs (Sorenson test)  10x                PT Short Term Goals - 11/22/18 1201      PT SHORT TERM GOAL #1   Title  be independent in initial HEP appropriate for lumbar fusion    Status  Achieved      PT SHORT TERM GOAL #2   Title  The patient will report a 30% improvement in back and LE pain and symptoms with usual ADLs    Status  Achieved      PT SHORT TERM GOAL #3   Title  The patient will have improved bil hip flexor lengths to +5 degrees bilaterally needed for ease with walking longer distances > 1 mile    Status  Achieved        PT Long Term Goals - 12/02/18 1733      PT LONG TERM GOAL #1   Title  be independent in advanced HEP and return to gym program     Time  6    Status  On-going      PT LONG TERM GOAL #2   Title  reduce FOTO to < or = to 46% limitation    Status  Achieved      PT LONG TERM GOAL #3   Title  The patient will have at least  75% of lumbar flexion and extension ROM needed for future return to golf    Time  6    Period  Weeks    Status  On-going      PT LONG TERM GOAL #4   Title  The patient will have at least 4 to 4+/5 strength in bilateral LEs needed for climbing stairs with an object and getting up off the floor    Time  8    Period  Weeks    Status  On-going      PT LONG TERM GOAL #5   Title  The patient will have 4+/5 core strength needed for lifting/carrying light to medium objects    Time  8    Period  Weeks    Status  On-going      PT LONG TERM GOAL #6   Title  The patient will have trunk and hip extensor strength needed to stand upright and walk medium distances without walking poles    Time  6    Period  Weeks    Status  On-going            Plan - 12/07/18 2158    Clinical Impression Statement  Despite the patient's report of a painful episode over the weekend he requests to do dead lifting today and do trunk extension in prone hanging off mat table (biering sorenson test position).  Therapist providing close supervision and verbally cueing for proper technique.  He reports these exercises are challenging and he "can feel them but in a good way."  He  also need cues to stop the exercise at the point of muscle fatigue rather than pushing through with poor form as a way to compensate. He continues to make progress overall but he has had several ups and downs with periodic flare ups.    Comorbidities  3 back surgeries; chronic history;  sleep apnea; multi  joint degenerative changes;  shoulder surgery     Examination-Activity Limitations  Stairs;Lift;Squat;Bend;Locomotion Level    Examination-Participation Restrictions  Church;Community Activity;Other    Stability/Clinical Decision Making  Evolving/Moderate complexity    Rehab Potential  Good    PT Frequency  2x / week    PT Treatment/Interventions  ADLs/Self Care Home Management;Cryotherapy;Electrical Stimulation;Moist Heat;Functional mobility  training;Therapeutic activities;Therapeutic exercise;Patient/family education;Neuromuscular re-education;Manual techniques;Taping    PT Next Visit Plan  core lumbo/pelvic and hip strengthening;  dead lifting with bar;  KX    PT Home Exercise Plan   Access Code: [email or web address]        Patient will benefit from skilled therapeutic intervention in order to improve the following deficits and impairments:  Pain, Difficulty walking, Decreased strength, Impaired perceived functional ability, Decreased activity tolerance, Decreased endurance, Decreased range of motion  Visit Diagnosis: 1. Muscle weakness (generalized)   2. Chronic left-sided low back pain with left-sided sciatica        Problem List There are no active problems to display for this patient.  Ruben Im, PT 12/07/18 10:08 PM Phone: [phone] Fax: [phone] Alvera Singh 12/07/2018, 10:08 PM  [facility] [address], [place], [place], [address] Phone: [phone]   Fax:  [phone]  Name: SARGENT MANKEY MRN: [id number] Date of Birth: 1951/02/24

## 2018-12-10 ENCOUNTER — Other Ambulatory Visit: Payer: Self-pay

## 2018-12-10 ENCOUNTER — Ambulatory Visit: Payer: PPO | Admitting: Physical Therapy

## 2018-12-10 ENCOUNTER — Encounter: Payer: Self-pay | Admitting: Physical Therapy

## 2018-12-10 DIAGNOSIS — M25511 Pain in right shoulder: Secondary | ICD-10-CM

## 2018-12-10 DIAGNOSIS — M6281 Muscle weakness (generalized): Secondary | ICD-10-CM

## 2018-12-10 DIAGNOSIS — G8929 Other chronic pain: Secondary | ICD-10-CM

## 2018-12-10 DIAGNOSIS — M25611 Stiffness of right shoulder, not elsewhere classified: Secondary | ICD-10-CM

## 2018-12-10 NOTE — Therapy (Signed)
[facility] [address] [place], [place], [address] Phone: [phone]   Fax:  [phone]  Physical Therapy Treatment  Patient Details  Name: Kevin Griffith MRN: [id number] Date of Birth: Jul 05, 1950 Referring Provider (PT): Dr. Eustace Moore   Encounter Date: 12/10/2018  PT End of Session - 12/10/18 0758    Visit Number  22    Date for PT Re-Evaluation  01/03/19    Authorization Type  Health Team     PT Start Time  0756    PT Stop Time  0837    PT Time Calculation (min)  41 min    Activity Tolerance  Patient tolerated treatment well    Behavior During Therapy  [facility] for tasks assessed/performed       Past Medical History:  Diagnosis Date  . Asymptomatic microscopic hematuria   . Back pain, chronic   . Cervical stenosis of spine   . Colon polyps   . DDD (degenerative disc disease), cervical   . DDD (degenerative disc disease), lumbar   . Drug rash   . ED (erectile dysfunction)   . Hyperbilirubinemia   . Hypertriglyceridemia   . IFG (impaired fasting glucose)   . Insomnia disorder   . Lactose intolerance   . Mood change   . MRSA (methicillin resistant Staphylococcus aureus)   . Palpitations   . Paroxysmal A-fib ([place])   . Skin problem   . Sleep apnea, obstructive   . Vitamin D deficiency     Past Surgical History:  Procedure Laterality Date  . APPENDECTOMY  1970  . [place], 07-2001  . HEMIDISCECECTOMY      There were no vitals filed for this visit.  Subjective Assessment - 12/10/18 0759    Subjective  I think I had a break through doing my exercises, my nerve roots may have "broken through." My hip is sore this morning.    Pertinent History  sleep apnea; leg length discrepancy; right shoulder surgery 2018 "working great" ;  PLIF L4-5 08/09/18    Currently in Pain?  Yes    Pain Score  3     Pain Location  Hip    Pain Orientation  Left    Pain Descriptors / Indicators  Sore    Multiple  Pain Sites  No                       [facility] Adult PT Treatment/Exercise - 12/10/18 0001      Lumbar Exercises: Stretches   Piriformis Stretch  Left;2 reps;30 seconds    Other Lumbar Stretch Exercise  right KTC with left leg straight on table to gentle stretch hip flexors  3x 30 sec   Pt stretched both sides     Lumbar Exercises: Aerobic   Nustep  L4 x 10 min LE only with discussion on current status      Lumbar Exercises: Machines for Strengthening   Other Lumbar Machine Exercise  lat pull down 35# 15x 2 VC to keep shoulders down      Lumbar Exercises: Standing   Shoulder Adduction Limitations  SLS LT with floor slider RT: add/abd RTLE 10x     Other Standing Lumbar Exercises  30# deadlifts from mat table: 30# bar 15x, mini bend in the knee    Other Standing Lumbar Exercises  35# backwards and forwards walking 10x each       Lumbar Exercises: Seated   Sit  to Stand  --   2x10   Sit to Stand Limitations  10# weight hold   seated on black pad              PT Short Term Goals - 11/22/18 1201      PT SHORT TERM GOAL #1   Title  be independent in initial HEP appropriate for lumbar fusion    Status  Achieved      PT SHORT TERM GOAL #2   Title  The patient will report a 30% improvement in back and LE pain and symptoms with usual ADLs    Status  Achieved      PT SHORT TERM GOAL #3   Title  The patient will have improved bil hip flexor lengths to +5 degrees bilaterally needed for ease with walking longer distances > 1 mile    Status  Achieved        PT Long Term Goals - 12/02/18 1733      PT LONG TERM GOAL #1   Title  be independent in advanced HEP and return to gym program     Time  6    Status  On-going      PT LONG TERM GOAL #2   Title  reduce FOTO to < or = to 46% limitation    Status  Achieved      PT LONG TERM GOAL #3   Title  The patient will have at least 75% of lumbar flexion and extension ROM needed for future return to golf    Time  6     Period  Weeks    Status  On-going      PT LONG TERM GOAL #4   Title  The patient will have at least 4 to 4+/5 strength in bilateral LEs needed for climbing stairs with an object and getting up off the floor    Time  8    Period  Weeks    Status  On-going      PT LONG TERM GOAL #5   Title  The patient will have 4+/5 core strength needed for lifting/carrying light to medium objects    Time  8    Period  Weeks    Status  On-going      PT LONG TERM GOAL #6   Title  The patient will have trunk and hip extensor strength needed to stand upright and walk medium distances without walking poles    Time  6    Period  Weeks    Status  On-going            Plan - 12/10/18 0758    Clinical Impression Statement  Pt presents with a sore/tight LT hip today. After stretching it, it did not provide any limitations for the remainder of the treatment. Pt was mainly fatigued throughout the session and declined some of the higher level back strengthening exercises ( trunk extensions).    Personal Factors and Comorbidities  Age;Comorbidity 1;Comorbidity 2;Comorbidity 3+;Time since onset of injury/illness/exacerbation    Comorbidities  3 back surgeries; chronic history;  sleep apnea; multi joint degenerative changes;  shoulder surgery     Examination-Activity Limitations  Stairs;Lift;Squat;Bend;Locomotion Level    Examination-Participation Restrictions  Church;Community Activity;Other    Stability/Clinical Decision Making  Evolving/Moderate complexity    Rehab Potential  Good    PT Frequency  2x / week    PT Duration  8 weeks    PT Treatment/Interventions  ADLs/Self Care Home  Management;Cryotherapy;Electrical Stimulation;Moist Heat;Functional mobility training;Therapeutic activities;Therapeutic exercise;Patient/family education;Neuromuscular re-education;Manual techniques;Taping    PT Next Visit Plan  core lumbo/pelvic and hip strengthening;  dead lifting with bar;  KX    PT Home Exercise Plan    Access Code: [email or web address]     Consulted and Agree with Plan of Care  Patient       Patient will benefit from skilled therapeutic intervention in order to improve the following deficits and impairments:  Pain, Difficulty walking, Decreased strength, Impaired perceived functional ability, Decreased activity tolerance, Decreased endurance, Decreased range of motion  Visit Diagnosis: 1. Muscle weakness (generalized)   2. Chronic left-sided low back pain with left-sided sciatica   3. Acute pain of right shoulder   4. Stiffness of right shoulder, not elsewhere classified        Problem List There are no active problems to display for this patient.   ,, PTA 12/10/2018, 8:44 AM  [facility] [address] [place], [place], [address] Phone: [phone]   Fax:  [phone]  Name: CAEL WORTH MRN: [id number] Date of Birth: Sep 26, 1950

## 2018-12-14 ENCOUNTER — Encounter: Payer: Self-pay | Admitting: Physical Therapy

## 2018-12-14 ENCOUNTER — Other Ambulatory Visit: Payer: Self-pay

## 2018-12-14 ENCOUNTER — Ambulatory Visit: Payer: PPO | Admitting: Physical Therapy

## 2018-12-14 DIAGNOSIS — M6281 Muscle weakness (generalized): Secondary | ICD-10-CM | POA: Diagnosis not present

## 2018-12-14 DIAGNOSIS — M5442 Lumbago with sciatica, left side: Secondary | ICD-10-CM

## 2018-12-14 DIAGNOSIS — G8929 Other chronic pain: Secondary | ICD-10-CM

## 2018-12-14 NOTE — Therapy (Signed)
[facility] [address], [place], [place], [address] Phone: [phone]   Fax:  [phone]  Physical Therapy Treatment  Patient Details  Name: Kevin Griffith MRN: [id number] Date of Birth: Jul 09, 1950 Referring Provider (PT): Dr. Eustace Moore   Encounter Date: 12/14/2018  PT End of Session - 12/14/18 1002    Visit Number  23    Date for PT Re-Evaluation  01/03/19    Authorization Type  Health Team     PT Start Time  [phone]    PT Stop Time  1035    PT Time Calculation (min)  42 min    Activity Tolerance  Patient tolerated treatment well       Past Medical History:  Diagnosis Date  . Asymptomatic microscopic hematuria   . Back pain, chronic   . Cervical stenosis of spine   . Colon polyps   . DDD (degenerative disc disease), cervical   . DDD (degenerative disc disease), lumbar   . Drug rash   . ED (erectile dysfunction)   . Hyperbilirubinemia   . Hypertriglyceridemia   . IFG (impaired fasting glucose)   . Insomnia disorder   . Lactose intolerance   . Mood change   . MRSA (methicillin resistant Staphylococcus aureus)   . Palpitations   . Paroxysmal A-fib ([place])   . Skin problem   . Sleep apnea, obstructive   . Vitamin D deficiency     Past Surgical History:  Procedure Laterality Date  . APPENDECTOMY  1970  . [place], 07-2001  . HEMIDISCECECTOMY      There were no vitals filed for this visit.  Subjective Assessment - 12/14/18 0954    Subjective  It's going to be a good week.  The wife left for [place].  I was able to help her carry her luggage.  It's up and down.  That nerve root is moving around.  I ordered a hyperextension bench for home.  My hip loosens up with exercises.   I took some Alka Seltzers this morning for my hip.  I took an easy day yesterday but still did  the equivalent of 2 1/2 miles.    Pertinent History  sleep apnea; leg length discrepancy; right shoulder surgery 2018  "working great" ;  PLIF L4-5 08/09/18;  MD 8/20    Patient Stated Goals  get hip flexors, gluteals built back up and leg stronger while gyms are closed ;  build up lats; build up support for spine; do dead lifts; get back to gym; dead lifts     Currently in Pain?  Yes    Pain Score  3     Pain Location  Hip    Pain Orientation  Left    Pain Type  Surgical pain                       [facility] Adult PT Treatment/Exercise - 12/14/18 0001      Therapeutic Activites    ADL's  dead lifting 25# to knee level  15x; walking, standing; lifting with bent knees       Lumbar Exercises: Stretches   Piriformis Stretch  Left;2 reps;30 seconds    Other Lumbar Stretch Exercise  KTC right /left       Lumbar Exercises: Aerobic   Nustep  L6 x 10 min LE only with discussion on current status      Lumbar Exercises: Standing   Shoulder  Adduction Limitations  SLS with other foot flat against wall dead lift forward 5x right/left     Other Standing Lumbar Exercises  standing on BOSU with small plyo ball chops    Other Standing Lumbar Exercises  SLS with one foot on BOSY with plyo ball chops       Lumbar Exercises: Supine   Other Supine Lumbar Exercises  small oscillation crunches 8x    Other Supine Lumbar Exercises  LE lifts small range 10x       Lumbar Exercises: Sidelying   Other Sidelying Lumbar Exercises  side planks 5x right/left       Lumbar Exercises: Quadruped   Other Quadruped Lumbar Exercises  small bent knee extensions 10x right/left 2 sets                PT Short Term Goals - 11/22/18 1201      PT SHORT TERM GOAL #1   Title  be independent in initial HEP appropriate for lumbar fusion    Status  Achieved      PT SHORT TERM GOAL #2   Title  The patient will report a 30% improvement in back and LE pain and symptoms with usual ADLs    Status  Achieved      PT SHORT TERM GOAL #3   Title  The patient will have improved bil hip flexor lengths to +5 degrees bilaterally  needed for ease with walking longer distances > 1 mile    Status  Achieved        PT Long Term Goals - 12/02/18 1733      PT LONG TERM GOAL #1   Title  be independent in advanced HEP and return to gym program     Time  6    Status  On-going      PT LONG TERM GOAL #2   Title  reduce FOTO to < or = to 46% limitation    Status  Achieved      PT LONG TERM GOAL #3   Title  The patient will have at least 75% of lumbar flexion and extension ROM needed for future return to golf    Time  6    Period  Weeks    Status  On-going      PT LONG TERM GOAL #4   Title  The patient will have at least 4 to 4+/5 strength in bilateral LEs needed for climbing stairs with an object and getting up off the floor    Time  8    Period  Weeks    Status  On-going      PT LONG TERM GOAL #5   Title  The patient will have 4+/5 core strength needed for lifting/carrying light to medium objects    Time  8    Period  Weeks    Status  On-going      PT LONG TERM GOAL #6   Title  The patient will have trunk and hip extensor strength needed to stand upright and walk medium distances without walking poles    Time  6    Period  Weeks    Status  On-going            Plan - 12/14/18 1003    Clinical Impression Statement  The patient continues to have intermittent left hip pain, left low back pain and numbness in feet but he continues to be willing to progress intensity of lumbo/pelvic and hip strengthening ex's.  He will often do more repetitions than requested or add more resistance at times.  Hip OA is a considerable limiting factor with single leg activities.  Verbal cues for hip hinge form with dead lifting needed.  Left LE muscle atrophy noted in quads and HS.  Therapist closely monitoring response and cues for moderation of activity to avoid "overdoing it"    Personal Factors and Comorbidities  Age;Comorbidity 1;Comorbidity 2;Comorbidity 3+;Time since onset of injury/illness/exacerbation    Comorbidities   3 back surgeries; chronic history;  sleep apnea; multi joint degenerative changes;  shoulder surgery     Examination-Participation Restrictions  Church;Community Activity;Other    Rehab Potential  Good    PT Frequency  2x / week    PT Duration  8 weeks    PT Treatment/Interventions  ADLs/Self Care Home Management;Cryotherapy;Electrical Stimulation;Moist Heat;Functional mobility training;Therapeutic activities;Therapeutic exercise;Patient/family education;Neuromuscular re-education;Manual techniques;Taping    PT Next Visit Plan  see how MD appt went;  core lumbo/pelvic and hip strengthening;  dead lifting with bar;  KX    PT Home Exercise Plan   Access Code: [email or web address]        Patient will benefit from skilled therapeutic intervention in order to improve the following deficits and impairments:  Pain, Difficulty walking, Decreased strength, Impaired perceived functional ability, Decreased activity tolerance, Decreased endurance, Decreased range of motion  Visit Diagnosis: 1. Muscle weakness (generalized)   2. Chronic left-sided low back pain with left-sided sciatica        Problem List There are no active problems to display for this patient.  Ruben Im, PT 12/14/18 10:55 AM Phone: [phone] Fax: [phone] Kevin Griffith 12/14/2018, 10:55 AM  [facility] [address], [place], [place], [address] Phone: [phone]   Fax:  [phone]  Name: Kevin Griffith MRN: [id number] Date of Birth: Jan 22, 1951

## 2018-12-17 ENCOUNTER — Encounter: Payer: PPO | Admitting: Physical Therapy

## 2018-12-21 ENCOUNTER — Ambulatory Visit: Payer: PPO | Admitting: Physical Therapy

## 2018-12-21 ENCOUNTER — Encounter: Payer: Self-pay | Admitting: Physical Therapy

## 2018-12-21 ENCOUNTER — Other Ambulatory Visit: Payer: Self-pay

## 2018-12-21 DIAGNOSIS — M6281 Muscle weakness (generalized): Secondary | ICD-10-CM

## 2018-12-21 DIAGNOSIS — G8929 Other chronic pain: Secondary | ICD-10-CM

## 2018-12-21 NOTE — Therapy (Signed)
[facility] [address], [place], [place], [address] Phone: [phone]   Fax:  [phone]  Physical Therapy Treatment  Patient Details  Name: Kevin Griffith MRN: [id number] Date of Birth: 08-14-1950 Referring Provider (PT): Dr. Eustace Moore   Encounter Date: 12/21/2018  PT End of Session - 12/21/18 0821    Visit Number  24    Date for PT Re-Evaluation  01/03/19    Authorization Type  Health Team     PT Start Time  0810    PT Stop Time  0855    PT Time Calculation (min)  45 min    Activity Tolerance  Patient tolerated treatment well       Past Medical History:  Diagnosis Date  . Asymptomatic microscopic hematuria   . Back pain, chronic   . Cervical stenosis of spine   . Colon polyps   . DDD (degenerative disc disease), cervical   . DDD (degenerative disc disease), lumbar   . Drug rash   . ED (erectile dysfunction)   . Hyperbilirubinemia   . Hypertriglyceridemia   . IFG (impaired fasting glucose)   . Insomnia disorder   . Lactose intolerance   . Mood change   . MRSA (methicillin resistant Staphylococcus aureus)   . Palpitations   . Paroxysmal A-fib ([place])   . Skin problem   . Sleep apnea, obstructive   . Vitamin D deficiency     Past Surgical History:  Procedure Laterality Date  . APPENDECTOMY  1970  . [place], 07-2001  . HEMIDISCECECTOMY      There were no vitals filed for this visit.  Subjective Assessment - 12/21/18 0819    Subjective  Had another hip injection on Thursday and so I've been taking it easy since then.   I'll have one more hip injection in September.    I've increased my supplement and my depression is better. My foot is starting to wake up.  I bought a vibrating board for home and my back extension table will come on Friday.    Pertinent History  sleep apnea; leg length discrepancy; right shoulder surgery 2018 "working great" ;  PLIF L4-5 08/09/18;  MD on Thursday     Patient Stated Goals  get hip flexors, gluteals built back up and leg stronger while gyms are closed ;  build up lats; build up support for spine; do dead lifts; get back to gym; dead lifts     Currently in Pain?  Yes    Pain Score  1     Pain Location  Back    Pain Orientation  Left                       [facility] Adult PT Treatment/Exercise - 12/21/18 0001      Therapeutic Activites    ADL's  dead lifting 25# to knee level  120x; walking, standing; lifting with bent knees       Lumbar Exercises: Aerobic   Nustep  L5-L7 12 min    while discussing progress and status      Lumbar Exercises: Machines for Strengthening   Other Lumbar Machine Exercise  lat pull down 55# 15x2      Lumbar Exercises: Supine   Bridge  5 reps    Straight Leg Raise  10 reps;Other (comment)    Straight Leg Raises Limitations  30x right/left     Other Supine Lumbar Exercises  hip abduction pulses 25x right/left     Other Supine Lumbar Exercises  mini crunches and obliques right/left 25x each       Lumbar Exercises: Sidelying   Other Sidelying Lumbar Exercises  side planks 1x right/left 30 sec holds       Lumbar Exercises: Prone   Other Prone Lumbar Exercises  push ups on bench       Lumbar Exercises: Quadruped   [place]  10 reps    Plank  modified plank 2x;  full plank 30 sec     Other Quadruped Lumbar Exercises  small bent knee extensions 10x right/left 2 sets                PT Short Term Goals - 11/22/18 1201      PT SHORT TERM GOAL #1   Title  be independent in initial HEP appropriate for lumbar fusion    Status  Achieved      PT SHORT TERM GOAL #2   Title  The patient will report a 30% improvement in back and LE pain and symptoms with usual ADLs    Status  Achieved      PT SHORT TERM GOAL #3   Title  The patient will have improved bil hip flexor lengths to +5 degrees bilaterally needed for ease with walking longer distances > 1 mile    Status  Achieved         PT Long Term Goals - 12/02/18 1733      PT LONG TERM GOAL #1   Title  be independent in advanced HEP and return to gym program     Time  6    Status  On-going      PT LONG TERM GOAL #2   Title  reduce FOTO to < or = to 46% limitation    Status  Achieved      PT LONG TERM GOAL #3   Title  The patient will have at least 75% of lumbar flexion and extension ROM needed for future return to golf    Time  6    Period  Weeks    Status  On-going      PT LONG TERM GOAL #4   Title  The patient will have at least 4 to 4+/5 strength in bilateral LEs needed for climbing stairs with an object and getting up off the floor    Time  8    Period  Weeks    Status  On-going      PT LONG TERM GOAL #5   Title  The patient will have 4+/5 core strength needed for lifting/carrying light to medium objects    Time  8    Period  Weeks    Status  On-going      PT LONG TERM GOAL #6   Title  The patient will have trunk and hip extensor strength needed to stand upright and walk medium distances without walking poles    Time  6    Period  Weeks    Status  On-going            Plan - 12/21/18 1006    Clinical Impression Statement  The patient demonstrates good carryover with lifting using hip hinge technique.  He is able to perform a progression to  more intermediate and low-advanced level ex's with minimal hip discomfort and pain relief of initial left LBP.  He needs minimal cues to perform his HEP and recommendation  not to overdo it and progress himself to quickly on resistance with his home equipment.  Anticipate readiness next visit for discharge from PT.    Personal Factors and Comorbidities  Age;Comorbidity 1;Comorbidity 2;Comorbidity 3+;Time since onset of injury/illness/exacerbation    Comorbidities  3 back surgeries; chronic history;  sleep apnea; multi joint degenerative changes;  shoulder surgery     Examination-Activity Limitations  Stairs;Lift;Squat;Bend;Locomotion Level     Examination-Participation Restrictions  Church;Community Activity;Other    Rehab Potential  Good    PT Frequency  2x / week    PT Duration  8 weeks    PT Treatment/Interventions  ADLs/Self Care Home Management;Cryotherapy;Electrical Stimulation;Moist Heat;Functional mobility training;Therapeutic activities;Therapeutic exercise;Patient/family education;Neuromuscular re-education;Manual techniques;Taping    PT Next Visit Plan  see how MD appt with Dr. Ronnald Ramp went;  anticipate discharge from PT next visit;  check remaining goals and FOTO;  core lumbo/pelvic and hip strengthening;  dead lifting with bar;  KX    PT Home Exercise Plan   Access Code: [email or web address]        Patient will benefit from skilled therapeutic intervention in order to improve the following deficits and impairments:  Pain, Difficulty walking, Decreased strength, Impaired perceived functional ability, Decreased activity tolerance, Decreased endurance, Decreased range of motion  Visit Diagnosis: Muscle weakness (generalized)  Chronic left-sided low back pain with left-sided sciatica     Problem List There are no active problems to display for this patient.  Ruben Im, PT 12/21/18 10:13 AM Phone: [phone] Fax: [phone] Alvera Singh 12/21/2018, 10:13 AM  [facility] [address], [place], [place], [address] Phone: [phone]   Fax:  [phone]  Name: Kevin Griffith MRN: [id number] Date of Birth: 10-25-1950

## 2018-12-23 DIAGNOSIS — M5416 Radiculopathy, lumbar region: Secondary | ICD-10-CM | POA: Diagnosis not present

## 2018-12-24 ENCOUNTER — Other Ambulatory Visit: Payer: Self-pay

## 2018-12-24 ENCOUNTER — Ambulatory Visit: Payer: PPO | Admitting: Physical Therapy

## 2018-12-24 ENCOUNTER — Encounter: Payer: Self-pay | Admitting: Physical Therapy

## 2018-12-24 DIAGNOSIS — G8929 Other chronic pain: Secondary | ICD-10-CM

## 2018-12-24 DIAGNOSIS — M5442 Lumbago with sciatica, left side: Secondary | ICD-10-CM

## 2018-12-24 DIAGNOSIS — M6281 Muscle weakness (generalized): Secondary | ICD-10-CM

## 2018-12-24 NOTE — Therapy (Signed)
[facility] [address] [place], [place], [address] Phone: [phone]   Fax:  [phone]  Physical Therapy Treatment/Discharge Summary   Patient Details  Name: Kevin Griffith MRN: [id number] Date of Birth: Jul 15, 1950 Referring Provider (PT): Dr. Eustace Moore   Encounter Date: 12/24/2018  PT End of Session - 12/24/18 0838    Visit Number  25    Date for PT Re-Evaluation  01/03/19    Authorization Type  Health Team     PT Start Time  0808    PT Stop Time  0846    PT Time Calculation (min)  38 min    Activity Tolerance  Patient tolerated treatment well       Past Medical History:  Diagnosis Date  . Asymptomatic microscopic hematuria   . Back pain, chronic   . Cervical stenosis of spine   . Colon polyps   . DDD (degenerative disc disease), cervical   . DDD (degenerative disc disease), lumbar   . Drug rash   . ED (erectile dysfunction)   . Hyperbilirubinemia   . Hypertriglyceridemia   . IFG (impaired fasting glucose)   . Insomnia disorder   . Lactose intolerance   . Mood change   . MRSA (methicillin resistant Staphylococcus aureus)   . Palpitations   . Paroxysmal A-fib ([place])   . Skin problem   . Sleep apnea, obstructive   . Vitamin D deficiency     Past Surgical History:  Procedure Laterality Date  . APPENDECTOMY  1970  . [place], 07-2001  . HEMIDISCECECTOMY      There were no vitals filed for this visit.  Subjective Assessment - 12/24/18 0809    Subjective  The good news is that Dr. Ronnald Ramp said no restrictions, do whatever I wanted.  The bad news is that it's not completely fused yet.  Next MD appt in February.  Soreness in back.  Both feet fairly numb on constant basis but better with activity.    Pertinent History  sleep apnea; leg length discrepancy; right shoulder surgery 2018 "working great" ;  PLIF L4-5 08/09/18;  MD on Thursday    Patient Stated Goals  get hip flexors, gluteals  built back up and leg stronger while gyms are closed ;  build up lats; build up support for spine; do dead lifts; get back to gym; dead lifts     Currently in Pain?  No/denies    Pain Score  0-No pain         [facility] PT Assessment - 12/24/18 0001      Assessment   Medical Diagnosis  PLIF L4-5 fusion    Referring Provider (PT)  Dr. Eustace Moore    Onset Date/Surgical Date  08/09/18      Observation/Other Assessments   Focus on Therapeutic Outcomes (FOTO)   28% limitation       AROM   Lumbar Flexion  40    Lumbar Extension  15    Lumbar - Right Side Bend  30    Lumbar - Left Side Bend  25      Strength   Right Hip Flexion  4+/5    Right Hip Extension  4+/5    Left Hip Flexion  4+/5    Left Hip Extension  4+/5    Left Hip ABduction  4/5    Lumbar Flexion  4/5    Lumbar Extension  4/5  [facility] Adult PT Treatment/Exercise - 12/24/18 0001      Self-Care   Self-Care  ADL's    ADL's  discussed walking program including poles to maintain upright posture       Lumbar Exercises: Aerobic   Recumbent Bike  4 min   pt previously unable to do bike b/c of hip    Nustep  L5-L7 12 min    while discussing progress and status      Lumbar Exercises: Seated   Sit to Stand  5 reps    Sit to Stand Limitations  assessment for ability without UEs    Other Seated Lumbar Exercises  comprehensive review of HEP to discuss graded ex and activity to avoid overdoing it and exacerbations "don't burn the popcorn" analogy      Lumbar Exercises: Supine   Ab Set  5 reps    AB Set Limitations  called his attention to transverse abdominus activation before leg lifting       Lumbar Exercises: Sidelying   Hip Abduction  Left;5 reps               PT Short Term Goals - 12/24/18 1208      PT SHORT TERM GOAL #1   Title  be independent in initial HEP appropriate for lumbar fusion    Status  Achieved      PT SHORT TERM GOAL #2   Title  The patient will report a 30%  improvement in back and LE pain and symptoms with usual ADLs    Status  Achieved      PT SHORT TERM GOAL #3   Title  The patient will have improved bil hip flexor lengths to +5 degrees bilaterally needed for ease with walking longer distances > 1 mile    Status  Achieved        PT Long Term Goals - 12/24/18 4580      PT LONG TERM GOAL #1   Title  be independent in advanced HEP and return to gym program     Status  Achieved      PT LONG TERM GOAL #2   Title  reduce FOTO to < or = to 46% limitation    Status  Achieved      PT LONG TERM GOAL #3   Title  The patient will have at least 75% of lumbar flexion and extension ROM needed for future return to golf    Status  Partially Met      PT LONG TERM GOAL #4   Title  The patient will have at least 4 to 4+/5 strength in bilateral LEs needed for climbing stairs with an object and getting up off the floor    Status  Achieved      PT LONG TERM GOAL #5   Title  The patient will have 4+/5 core strength needed for lifting/carrying light to medium objects    Status  Partially Met      PT LONG TERM GOAL #6   Title  The patient will have trunk and hip extensor strength needed to stand upright and walk medium distances without walking poles    Status  Achieved            Plan - 12/24/18 0839    Clinical Impression Statement  Patient reports he is 55% better since start of care.  He has made excellent progress with return to function and is  highly compliant with his exercise program.  Extensive  education on graded activity and exercise progression as patient has the tendency to "overdo" and exacerbate symptoms.  He demonstrates good technique with lifting medium weights using hip hinge method. He has met the majority of rehab goals.   He is walking several miles on a regular basis.   Recommend discharge from PT at this time.    Comorbidities  3 back surgeries; chronic history;  sleep apnea; multi joint degenerative changes;  shoulder  surgery     PT Frequency  2x / week    PT Duration  8 weeks    PT Treatment/Interventions  ADLs/Self Care Home Management;Cryotherapy;Electrical Stimulation;Moist Heat;Functional mobility training;Therapeutic activities;Therapeutic exercise;Patient/family education;Neuromuscular re-education;Manual techniques;Taping    PT Home Exercise Plan   Access Code: [email or web address]        Patient will benefit from skilled therapeutic intervention in order to improve the following deficits and impairments:  Pain, Difficulty walking, Decreased strength, Impaired perceived functional ability, Decreased activity tolerance, Decreased endurance, Decreased range of motion  Visit Diagnosis: Muscle weakness (generalized)  Chronic left-sided low back pain with left-sided sciatica  PHYSICAL THERAPY DISCHARGE SUMMARY  Visits from Start of Care: 25  Current functional level related to goals / functional outcomes: See clinical impressions above   Remaining deficits: As above   Education / Equipment: HEP Plan: Patient agrees to discharge.  Patient goals were met. Patient is being discharged due to meeting the stated rehab goals.  ?????       Problem List There are no active problems to display for this patient.  Ruben Im, PT 12/24/18 12:10 PM Phone: [phone] Fax: [phone] Alvera Singh 12/24/2018, 12:08 PM  [facility] [address] [place], [place], [address] Phone: [phone]   Fax:  [phone]  Name: RIDER ERMIS MRN: [id number] Date of Birth: February 20, 1951

## 2019-05-09 DIAGNOSIS — H2513 Age-related nuclear cataract, bilateral: Secondary | ICD-10-CM | POA: Diagnosis not present

## 2019-05-09 DIAGNOSIS — H5203 Hypermetropia, bilateral: Secondary | ICD-10-CM | POA: Diagnosis not present

## 2019-05-09 DIAGNOSIS — H524 Presbyopia: Secondary | ICD-10-CM | POA: Diagnosis not present

## 2019-05-09 DIAGNOSIS — H43812 Vitreous degeneration, left eye: Secondary | ICD-10-CM | POA: Diagnosis not present

## 2019-06-23 DIAGNOSIS — M5416 Radiculopathy, lumbar region: Secondary | ICD-10-CM | POA: Diagnosis not present

## 2019-08-03 DIAGNOSIS — S9032XA Contusion of left foot, initial encounter: Secondary | ICD-10-CM | POA: Diagnosis not present

## 2019-08-03 DIAGNOSIS — M79672 Pain in left foot: Secondary | ICD-10-CM | POA: Diagnosis not present

## 2019-08-25 ENCOUNTER — Other Ambulatory Visit: Payer: Self-pay

## 2019-08-25 ENCOUNTER — Ambulatory Visit (AMBULATORY_SURGERY_CENTER): Payer: Self-pay

## 2019-08-25 VITALS — Temp 97.5°F | Ht 71.5 in | Wt 226.2 lb

## 2019-08-25 DIAGNOSIS — Z860100 Personal history of colon polyps, unspecified: Secondary | ICD-10-CM

## 2019-08-25 NOTE — Progress Notes (Signed)
No allergies to soy or egg Pt is not on blood thinners or diet pills Denies issues with sedation/intubation Denies atrial flutter/fib Denies constipation   Emmi instructions given to pt  Pt is aware of Covid safety and care partner requirements.  

## 2019-09-01 ENCOUNTER — Other Ambulatory Visit: Payer: Self-pay

## 2019-09-01 ENCOUNTER — Ambulatory Visit (AMBULATORY_SURGERY_CENTER): Payer: PPO | Admitting: Gastroenterology

## 2019-09-01 ENCOUNTER — Encounter: Payer: Self-pay | Admitting: Gastroenterology

## 2019-09-01 VITALS — BP 143/90 | HR 62 | Temp 96.8°F | Resp 13 | Ht 71.0 in | Wt 226.0 lb

## 2019-09-01 DIAGNOSIS — Z860100 Personal history of colon polyps, unspecified: Secondary | ICD-10-CM

## 2019-09-01 DIAGNOSIS — G4733 Obstructive sleep apnea (adult) (pediatric): Secondary | ICD-10-CM | POA: Diagnosis not present

## 2019-09-01 DIAGNOSIS — I4891 Unspecified atrial fibrillation: Secondary | ICD-10-CM | POA: Diagnosis not present

## 2019-09-01 MED ORDER — SODIUM CHLORIDE 0.9 % IV SOLN: 500.0000 mL | Freq: Once | INTRAVENOUS | Status: DC

## 2019-09-01 NOTE — Progress Notes (Signed)
Report given to PACU, vss 

## 2019-09-01 NOTE — Op Note (Signed)
[name] Patient Name: Kevin Griffith Procedure Date: 09/01/2019 7:35 AM MRN: [id number] Endoscopist: Mallie Mussel L. Loletha Carrow , MD Age: 69 Referring MD:  Date of Birth: [birth year] Gender: Male Account #: [id number] Procedure:                Colonoscopy Indications:              Increased risk colon cancer surveillance: Personal                            history of sessile serrated colon polyp (10 mm or                            greater in size) - cecal and TC SSP ; 07/2016 Medicines:                Monitored Anesthesia Care Procedure:                Pre-Anesthesia Assessment:                           - Prior to the procedure, a History and Physical                            was performed, and patient medications and                            allergies were reviewed. The patient's tolerance of                            previous anesthesia was also reviewed. The risks                            and benefits of the procedure and the sedation                            options and risks were discussed with the patient.                            All questions were answered, and informed consent                            was obtained. Prior Anticoagulants: The patient has                            taken no previous anticoagulant or antiplatelet                            agents. ASA Grade Assessment: II - A patient with                            mild systemic disease. After reviewing the risks                            and benefits, the patient was deemed in  satisfactory condition to undergo the procedure.                           After obtaining informed consent, the colonoscope                            was passed under direct vision. Throughout the                            procedure, the patient's blood pressure, pulse, and                            oxygen saturations were monitored continuously. The                            Colonoscope was  introduced through the anus and                            advanced to the the terminal ileum, with                            identification of the appendiceal orifice and IC                            valve. The colonoscopy was performed without                            difficulty. The patient tolerated the procedure                            well. The quality of the bowel preparation was                            excellent. The terminal ileum, ileocecal valve,                            appendiceal orifice, and rectum were photographed.                            The bowel preparation used was Miralax. Scope In: 8:12:02 AM Scope Out: 8:25:12 AM Scope Withdrawal Time: 0 hours 8 minutes 1 second  Total Procedure Duration: 0 hours 13 minutes 10 seconds  Findings:                 The perianal and digital rectal examinations were                            normal.                           Multiple diverticula were found in the left colon.                           The exam was otherwise without abnormality on  direct and retroflexion views. Complications:            No immediate complications. Estimated Blood Loss:     Estimated blood loss: none. Impression:               - Diverticulosis in the left colon.                           - The examination was otherwise normal on direct                            and retroflexion views.                           - No specimens collected. Recommendation:           - Patient has a contact number available for                            emergencies. The signs and symptoms of potential                            delayed complications were discussed with the                            patient. Return to normal activities tomorrow.                            Written discharge instructions were provided to the                            patient.                           - Resume previous diet.                            - Continue present medications.                           - Repeat colonoscopy in 5 years for surveillance if                            overall health allows (recall 5 years and evaluate                            patient). [name] L. Loletha Carrow, MD 09/01/2019 8:34:19 AM This report has been signed electronically.

## 2019-09-01 NOTE — Patient Instructions (Signed)
Handout  On diverticulosis given to you today    YOU HAD AN ENDOSCOPIC PROCEDURE TODAY AT [facility]:   Refer to the procedure report that was given to you for any specific questions about what was found during the examination.  If the procedure report does not answer your questions, please call your gastroenterologist to clarify.  If you requested that your care partner not be given the details of your procedure findings, then the procedure report has been included in a sealed envelope for you to review at your convenience later.  YOU SHOULD EXPECT: Some feelings of bloating in the abdomen. Passage of more gas than usual.  Walking can help get rid of the air that was put into your GI tract during the procedure and reduce the bloating. If you had a lower endoscopy (such as a colonoscopy or flexible sigmoidoscopy) you may notice spotting of blood in your stool or on the toilet paper. If you underwent a bowel prep for your procedure, you may not have a normal bowel movement for a few days.  Please Note:  You might notice some irritation and congestion in your nose or some drainage.  This is from the oxygen used during your procedure.  There is no need for concern and it should clear up in a day or so.  SYMPTOMS TO REPORT IMMEDIATELY:   Following lower endoscopy (colonoscopy or flexible sigmoidoscopy):  Excessive amounts of blood in the stool  Significant tenderness or worsening of abdominal pains  Swelling of the abdomen that is new, acute  Fever of 100F or higher  For urgent or emergent issues, a gastroenterologist can be reached at any hour by calling [phone]. Do not use MyChart messaging for urgent concerns.    DIET:  We do recommend a small meal at first, but then you may proceed to your regular diet.  Drink plenty of fluids but you should avoid alcoholic beverages for 24 hours.  ACTIVITY:  You should plan to take it easy for the rest of today and you should NOT  DRIVE or use heavy machinery until tomorrow (because of the sedation medicines used during the test).    FOLLOW UP: Our staff will call the number listed on your records 48-72 hours following your procedure to check on you and address any questions or concerns that you may have regarding the information given to you following your procedure. If we do not reach you, we will leave a message.  We will attempt to reach you two times.  During this call, we will ask if you have developed any symptoms of COVID 19. If you develop any symptoms (ie: fever, flu-like symptoms, shortness of breath, cough etc.) before then, please call [phone].  If you test positive for Covid 19 in the 2 weeks post procedure, please call and report this information to [place].     SIGNATURES/CONFIDENTIALITY: You and/or your care partner have signed paperwork which will be entered into your electronic medical record.  These signatures attest to the fact that that the information above on your After Visit Summary has been reviewed and is understood.  Full responsibility of the confidentiality of this discharge information lies with you and/or your care-partner.

## 2019-09-05 ENCOUNTER — Telehealth: Payer: Self-pay | Admitting: *Deleted

## 2019-09-05 NOTE — Telephone Encounter (Signed)
  Follow up Call-  Call back number 09/01/2019  Post procedure Call Back phone  # [phone]  Permission to leave phone message Yes  Some recent data might be hidden     Patient questions:  Do you have a fever, pain , or abdominal swelling? No. Pain Score  0 *  Have you tolerated food without any problems? Yes.    Have you been able to return to your normal activities? Yes.    Do you have any questions about your discharge instructions: Diet   No. Medications  No. Follow up visit  No.  Do you have questions or concerns about your Care? No.  Actions: * If pain score is 4 or above: No action needed, pain <4.  1. Have you developed a fever since your procedure? no  2.   Have you had an respiratory symptoms (SOB or cough) since your procedure? no  3.   Have you tested positive for COVID 19 since your procedure no  4.   Have you had any family members/close contacts diagnosed with the COVID 19 since your procedure?  no   If yes to any of these questions please route to Joylene John, RN and Erenest Rasher, RN

## 2019-09-20 DIAGNOSIS — L723 Sebaceous cyst: Secondary | ICD-10-CM | POA: Diagnosis not present

## 2019-09-27 DIAGNOSIS — S61219A Laceration without foreign body of unspecified finger without damage to nail, initial encounter: Secondary | ICD-10-CM | POA: Diagnosis not present

## 2019-09-30 DIAGNOSIS — R7301 Impaired fasting glucose: Secondary | ICD-10-CM | POA: Diagnosis not present

## 2019-09-30 DIAGNOSIS — E559 Vitamin D deficiency, unspecified: Secondary | ICD-10-CM | POA: Diagnosis not present

## 2019-09-30 DIAGNOSIS — E781 Pure hyperglyceridemia: Secondary | ICD-10-CM | POA: Diagnosis not present

## 2019-10-03 DIAGNOSIS — M5030 Other cervical disc degeneration, unspecified cervical region: Secondary | ICD-10-CM | POA: Diagnosis not present

## 2019-10-03 DIAGNOSIS — E781 Pure hyperglyceridemia: Secondary | ICD-10-CM | POA: Diagnosis not present

## 2019-10-03 DIAGNOSIS — I700 Atherosclerosis of aorta: Secondary | ICD-10-CM | POA: Diagnosis not present

## 2019-10-03 DIAGNOSIS — Z1331 Encounter for screening for depression: Secondary | ICD-10-CM | POA: Diagnosis not present

## 2019-10-03 DIAGNOSIS — Z0000 Encounter for general adult medical examination without abnormal findings: Secondary | ICD-10-CM | POA: Diagnosis not present

## 2019-10-03 DIAGNOSIS — R7301 Impaired fasting glucose: Secondary | ICD-10-CM | POA: Diagnosis not present

## 2019-10-03 DIAGNOSIS — N529 Male erectile dysfunction, unspecified: Secondary | ICD-10-CM | POA: Diagnosis not present

## 2019-10-03 DIAGNOSIS — R3121 Asymptomatic microscopic hematuria: Secondary | ICD-10-CM | POA: Diagnosis not present

## 2019-10-03 DIAGNOSIS — D126 Benign neoplasm of colon, unspecified: Secondary | ICD-10-CM | POA: Diagnosis not present

## 2019-10-03 DIAGNOSIS — I480 Paroxysmal atrial fibrillation: Secondary | ICD-10-CM | POA: Diagnosis not present

## 2020-05-16 DIAGNOSIS — H5203 Hypermetropia, bilateral: Secondary | ICD-10-CM | POA: Diagnosis not present

## 2020-05-16 DIAGNOSIS — H43813 Vitreous degeneration, bilateral: Secondary | ICD-10-CM | POA: Diagnosis not present

## 2020-05-16 DIAGNOSIS — H2513 Age-related nuclear cataract, bilateral: Secondary | ICD-10-CM | POA: Diagnosis not present

## 2020-05-16 DIAGNOSIS — H524 Presbyopia: Secondary | ICD-10-CM | POA: Diagnosis not present

## 2020-07-26 ENCOUNTER — Other Ambulatory Visit (HOSPITAL_BASED_OUTPATIENT_CLINIC_OR_DEPARTMENT_OTHER): Payer: Self-pay

## 2020-10-15 DIAGNOSIS — R7301 Impaired fasting glucose: Secondary | ICD-10-CM | POA: Diagnosis not present

## 2020-10-15 DIAGNOSIS — E559 Vitamin D deficiency, unspecified: Secondary | ICD-10-CM | POA: Diagnosis not present

## 2020-10-15 DIAGNOSIS — Z0000 Encounter for general adult medical examination without abnormal findings: Secondary | ICD-10-CM | POA: Diagnosis not present

## 2020-10-15 DIAGNOSIS — Z125 Encounter for screening for malignant neoplasm of prostate: Secondary | ICD-10-CM | POA: Diagnosis not present

## 2020-10-15 DIAGNOSIS — E781 Pure hyperglyceridemia: Secondary | ICD-10-CM | POA: Diagnosis not present

## 2020-10-22 DIAGNOSIS — R030 Elevated blood-pressure reading, without diagnosis of hypertension: Secondary | ICD-10-CM | POA: Diagnosis not present

## 2020-10-22 DIAGNOSIS — R946 Abnormal results of thyroid function studies: Secondary | ICD-10-CM | POA: Diagnosis not present

## 2020-10-22 DIAGNOSIS — I480 Paroxysmal atrial fibrillation: Secondary | ICD-10-CM | POA: Diagnosis not present

## 2020-10-22 DIAGNOSIS — K5790 Diverticulosis of intestine, part unspecified, without perforation or abscess without bleeding: Secondary | ICD-10-CM | POA: Diagnosis not present

## 2020-10-22 DIAGNOSIS — R3121 Asymptomatic microscopic hematuria: Secondary | ICD-10-CM | POA: Diagnosis not present

## 2020-10-22 DIAGNOSIS — E559 Vitamin D deficiency, unspecified: Secondary | ICD-10-CM | POA: Diagnosis not present

## 2020-10-22 DIAGNOSIS — Z0000 Encounter for general adult medical examination without abnormal findings: Secondary | ICD-10-CM | POA: Diagnosis not present

## 2020-10-22 DIAGNOSIS — I700 Atherosclerosis of aorta: Secondary | ICD-10-CM | POA: Diagnosis not present

## 2020-10-22 DIAGNOSIS — R82998 Other abnormal findings in urine: Secondary | ICD-10-CM | POA: Diagnosis not present

## 2020-10-22 DIAGNOSIS — E781 Pure hyperglyceridemia: Secondary | ICD-10-CM | POA: Diagnosis not present

## 2020-10-22 DIAGNOSIS — Z1212 Encounter for screening for malignant neoplasm of rectum: Secondary | ICD-10-CM | POA: Diagnosis not present

## 2020-10-22 DIAGNOSIS — N529 Male erectile dysfunction, unspecified: Secondary | ICD-10-CM | POA: Diagnosis not present

## 2020-10-22 DIAGNOSIS — K9041 Non-celiac gluten sensitivity: Secondary | ICD-10-CM | POA: Diagnosis not present

## 2020-10-22 DIAGNOSIS — R7301 Impaired fasting glucose: Secondary | ICD-10-CM | POA: Diagnosis not present

## 2021-01-08 IMAGING — RF LUMBAR SPINE - 2-3 VIEW
1 series · 3 of 3 positions shown · non-contrast
Comparison: None

FLUOROSCOPY TIME:  1 minutes 5 seconds

CLINICAL DATA: Posterior lumbar fusion at L4-5

EXAM:
DG C-ARM 61-120 MIN; LUMBAR SPINE - 2-3 VIEW

[Series 1: run · 3 of 3 slices shown]
[im 1/3]
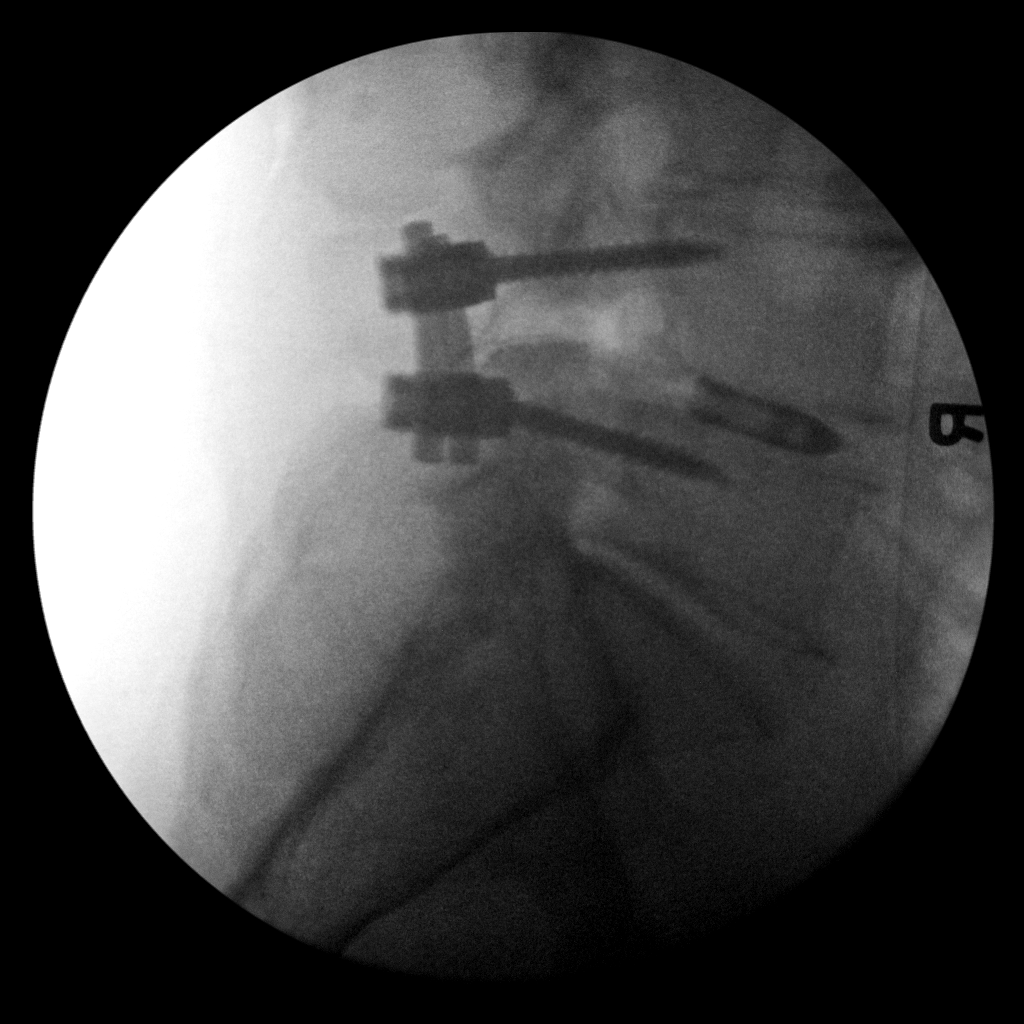
[im 2/3]
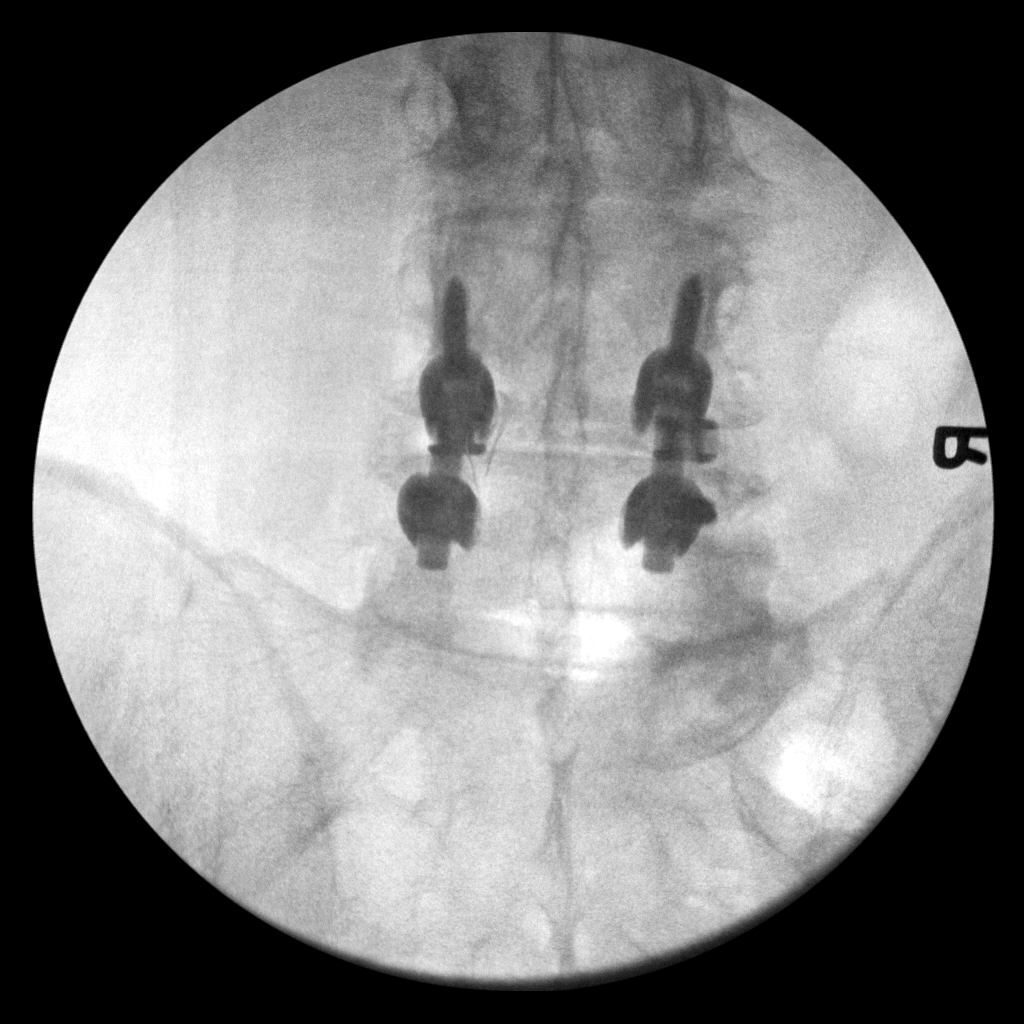
[im 3/3]
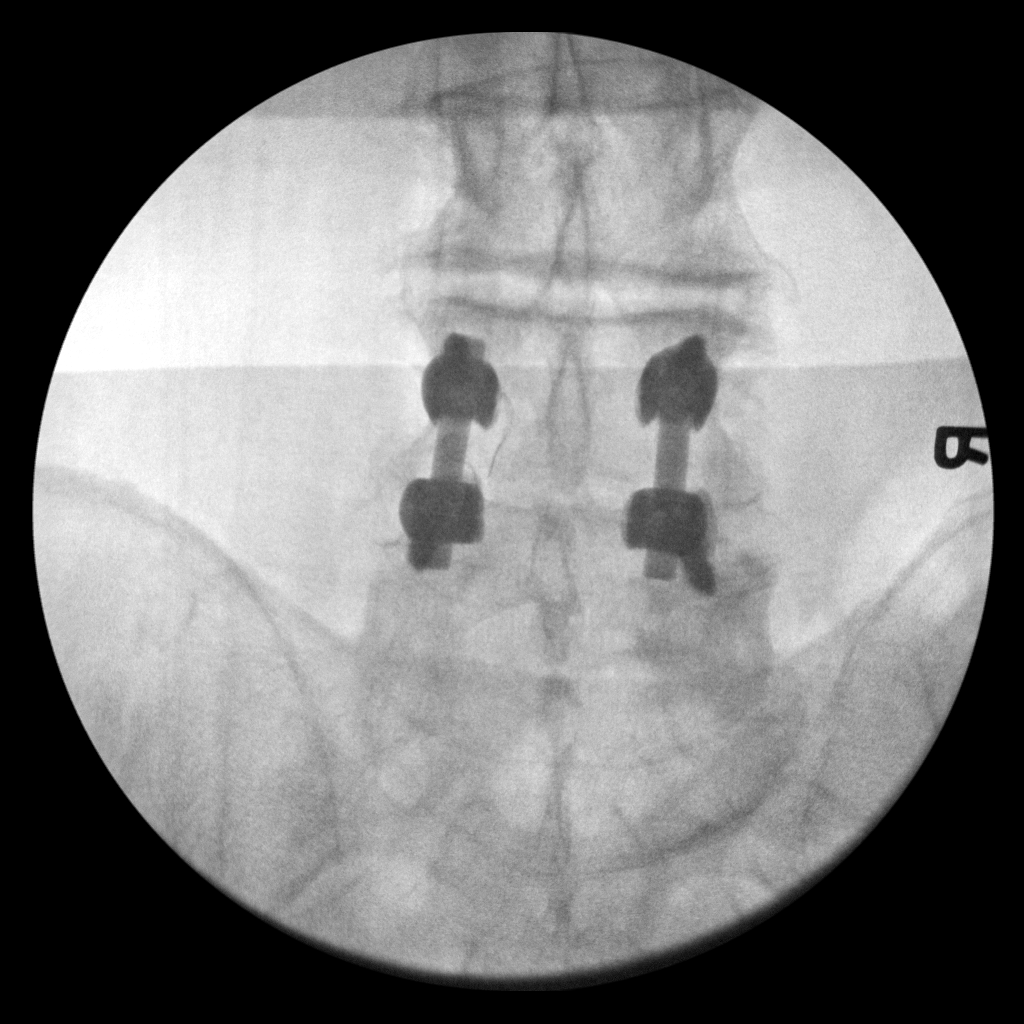

[3 of 3 positions shown; findings below may reference images not displayed]

FINDINGS: Three intraoperative fluoroscopic spot images. Posterior lumbar
interbody fusion at L4-5. Degenerative disease with disc height loss
at L3-4 and L5-S1.
IMPRESSION: Intraoperative localization.

## 2021-02-11 DIAGNOSIS — E781 Pure hyperglyceridemia: Secondary | ICD-10-CM | POA: Diagnosis not present

## 2021-02-11 DIAGNOSIS — R946 Abnormal results of thyroid function studies: Secondary | ICD-10-CM | POA: Diagnosis not present

## 2021-09-09 DIAGNOSIS — H2513 Age-related nuclear cataract, bilateral: Secondary | ICD-10-CM | POA: Diagnosis not present

## 2021-09-09 DIAGNOSIS — H1789 Other corneal scars and opacities: Secondary | ICD-10-CM | POA: Diagnosis not present

## 2021-09-09 DIAGNOSIS — H5203 Hypermetropia, bilateral: Secondary | ICD-10-CM | POA: Diagnosis not present

## 2021-09-09 DIAGNOSIS — H43813 Vitreous degeneration, bilateral: Secondary | ICD-10-CM | POA: Diagnosis not present

## 2021-11-27 DIAGNOSIS — R7989 Other specified abnormal findings of blood chemistry: Secondary | ICD-10-CM | POA: Diagnosis not present

## 2021-11-27 DIAGNOSIS — E559 Vitamin D deficiency, unspecified: Secondary | ICD-10-CM | POA: Diagnosis not present

## 2021-11-27 DIAGNOSIS — Z125 Encounter for screening for malignant neoplasm of prostate: Secondary | ICD-10-CM | POA: Diagnosis not present

## 2021-11-27 DIAGNOSIS — E781 Pure hyperglyceridemia: Secondary | ICD-10-CM | POA: Diagnosis not present

## 2021-11-27 DIAGNOSIS — R7301 Impaired fasting glucose: Secondary | ICD-10-CM | POA: Diagnosis not present

## 2021-11-29 DIAGNOSIS — Z0000 Encounter for general adult medical examination without abnormal findings: Secondary | ICD-10-CM | POA: Diagnosis not present

## 2021-12-04 ENCOUNTER — Other Ambulatory Visit: Payer: Self-pay | Admitting: Internal Medicine

## 2021-12-04 DIAGNOSIS — Z1331 Encounter for screening for depression: Secondary | ICD-10-CM | POA: Diagnosis not present

## 2021-12-04 DIAGNOSIS — E785 Hyperlipidemia, unspecified: Secondary | ICD-10-CM

## 2021-12-04 DIAGNOSIS — Z0000 Encounter for general adult medical examination without abnormal findings: Secondary | ICD-10-CM | POA: Diagnosis not present

## 2021-12-04 DIAGNOSIS — R7301 Impaired fasting glucose: Secondary | ICD-10-CM | POA: Diagnosis not present

## 2021-12-04 DIAGNOSIS — R82998 Other abnormal findings in urine: Secondary | ICD-10-CM | POA: Diagnosis not present

## 2021-12-04 DIAGNOSIS — R030 Elevated blood-pressure reading, without diagnosis of hypertension: Secondary | ICD-10-CM | POA: Diagnosis not present

## 2021-12-04 DIAGNOSIS — R7989 Other specified abnormal findings of blood chemistry: Secondary | ICD-10-CM | POA: Diagnosis not present

## 2021-12-04 DIAGNOSIS — I700 Atherosclerosis of aorta: Secondary | ICD-10-CM | POA: Diagnosis not present

## 2021-12-04 DIAGNOSIS — Z1339 Encounter for screening examination for other mental health and behavioral disorders: Secondary | ICD-10-CM | POA: Diagnosis not present

## 2021-12-04 DIAGNOSIS — I480 Paroxysmal atrial fibrillation: Secondary | ICD-10-CM | POA: Diagnosis not present

## 2021-12-04 DIAGNOSIS — K9041 Non-celiac gluten sensitivity: Secondary | ICD-10-CM | POA: Diagnosis not present

## 2021-12-16 ENCOUNTER — Ambulatory Visit
Admission: RE | Admit: 2021-12-16 | Discharge: 2021-12-16 | Disposition: A | Discharge status: Home / Self Care | Payer: PPO | Source: Ambulatory Visit | Attending: Internal Medicine | Admitting: Internal Medicine

## 2021-12-16 DIAGNOSIS — E7800 Pure hypercholesterolemia, unspecified: Secondary | ICD-10-CM | POA: Diagnosis not present

## 2021-12-16 DIAGNOSIS — E785 Hyperlipidemia, unspecified: Secondary | ICD-10-CM

## 2022-03-24 DIAGNOSIS — R030 Elevated blood-pressure reading, without diagnosis of hypertension: Secondary | ICD-10-CM | POA: Diagnosis not present

## 2022-03-24 DIAGNOSIS — Z7982 Long term (current) use of aspirin: Secondary | ICD-10-CM | POA: Diagnosis not present

## 2022-03-24 DIAGNOSIS — M791 Myalgia, unspecified site: Secondary | ICD-10-CM | POA: Diagnosis not present

## 2022-03-24 DIAGNOSIS — K9041 Non-celiac gluten sensitivity: Secondary | ICD-10-CM | POA: Diagnosis not present

## 2022-03-24 DIAGNOSIS — R7989 Other specified abnormal findings of blood chemistry: Secondary | ICD-10-CM | POA: Diagnosis not present

## 2022-03-24 DIAGNOSIS — I2510 Atherosclerotic heart disease of native coronary artery without angina pectoris: Secondary | ICD-10-CM | POA: Diagnosis not present

## 2022-03-24 DIAGNOSIS — R29898 Other symptoms and signs involving the musculoskeletal system: Secondary | ICD-10-CM | POA: Diagnosis not present

## 2022-03-24 DIAGNOSIS — I480 Paroxysmal atrial fibrillation: Secondary | ICD-10-CM | POA: Diagnosis not present

## 2022-04-07 DIAGNOSIS — M25512 Pain in left shoulder: Secondary | ICD-10-CM | POA: Diagnosis not present

## 2022-04-07 DIAGNOSIS — M542 Cervicalgia: Secondary | ICD-10-CM | POA: Diagnosis not present

## 2022-04-17 DIAGNOSIS — M5412 Radiculopathy, cervical region: Secondary | ICD-10-CM | POA: Diagnosis not present

## 2022-04-29 DIAGNOSIS — M4802 Spinal stenosis, cervical region: Secondary | ICD-10-CM | POA: Diagnosis not present

## 2022-04-29 DIAGNOSIS — R200 Anesthesia of skin: Secondary | ICD-10-CM | POA: Diagnosis not present

## 2022-04-29 DIAGNOSIS — M47812 Spondylosis without myelopathy or radiculopathy, cervical region: Secondary | ICD-10-CM | POA: Diagnosis not present

## 2022-04-29 DIAGNOSIS — M5412 Radiculopathy, cervical region: Secondary | ICD-10-CM | POA: Diagnosis not present

## 2022-04-29 DIAGNOSIS — M542 Cervicalgia: Secondary | ICD-10-CM | POA: Diagnosis not present

## 2022-05-01 DIAGNOSIS — R200 Anesthesia of skin: Secondary | ICD-10-CM | POA: Diagnosis not present

## 2022-05-01 DIAGNOSIS — R202 Paresthesia of skin: Secondary | ICD-10-CM | POA: Diagnosis not present

## 2022-05-01 DIAGNOSIS — Z6829 Body mass index (BMI) 29.0-29.9, adult: Secondary | ICD-10-CM | POA: Diagnosis not present

## 2022-06-02 DIAGNOSIS — G5603 Carpal tunnel syndrome, bilateral upper limbs: Secondary | ICD-10-CM | POA: Diagnosis not present

## 2022-06-05 DIAGNOSIS — G5603 Carpal tunnel syndrome, bilateral upper limbs: Secondary | ICD-10-CM | POA: Diagnosis not present

## 2022-06-05 DIAGNOSIS — M4802 Spinal stenosis, cervical region: Secondary | ICD-10-CM | POA: Diagnosis not present

## 2022-06-05 DIAGNOSIS — Z6829 Body mass index (BMI) 29.0-29.9, adult: Secondary | ICD-10-CM | POA: Diagnosis not present

## 2022-06-05 DIAGNOSIS — M5412 Radiculopathy, cervical region: Secondary | ICD-10-CM | POA: Diagnosis not present

## 2022-06-26 NOTE — Progress Notes (Signed)
Surgical Instructions    Your procedure is scheduled on Wednesday, July 02, 2022.  Report to [facility] Main Entrance "A" at 6:30 A.M., then check in with the Admitting office.  Call this number if you have problems the morning of surgery:  [phone]   If you have any questions prior to your surgery date call [phone]: Open Monday-Friday 8am-4pm If you experience any cold or flu symptoms such as cough, fever, chills, shortness of breath, etc. between now and your scheduled surgery, please notify us at the above number     Remember:  Do not eat after midnight the night before your surgery  You may drink clear liquids until 5:30 the morning of your surgery.   Clear liquids allowed are: Water, Non-Citrus Juices (without pulp), Carbonated Beverages, Clear Tea, Black Coffee ONLY (NO MILK, CREAM OR POWDERED CREAMER of any kind), and Gatorade   Take these medicines As needed  the morning of surgery with A SIP OF WATER:  traMADol (ULTRAM)    As of today, STOP taking any Aspirin (unless otherwise instructed by your surgeon) Aleve, Naproxen, Ibuprofen, Motrin, Advil, Goody's, BC's, all herbal medications, fish oil, and all vitamins.           Do not wear jewelry  Do not wear lotions, powders, cologne or deodorant. Do not shave 48 hours prior to surgery.   Do not bring valuables to the hospital.  [facility] is not responsible for any belongings or valuables.    Do NOT Smoke (Tobacco/Vaping)  24 hours prior to your procedure  If you use a CPAP at night, you may bring your mask for your overnight stay.   Contacts, glasses, hearing aids, dentures or partials may not be worn into surgery, please bring cases for these belongings   For patients admitted to the hospital, discharge time will be determined by your treatment team.   Patients discharged the day of surgery will not be allowed to drive home, and someone needs to stay with them for 24 hours.   SURGICAL WAITING ROOM  VISITATION Patients having surgery or a procedure may have no more than 2 support people in the waiting area - these visitors may rotate.   Children under the age of 14 must have an adult with them who is not the patient. If the patient needs to stay at the hospital during part of their recovery, the visitor guidelines for inpatient rooms apply. Pre-op nurse will coordinate an appropriate time for 1 support person to accompany patient in pre-op.  This support person may not rotate.   Please refer to [email or web address] for the visitor guidelines for Inpatients (after your surgery is over and you are in a regular room).    Special instructions:    Oral Hygiene is also important to reduce your risk of infection.  Remember - BRUSH YOUR TEETH THE MORNING OF SURGERY WITH YOUR REGULAR TOOTHPASTE   [place] [place] [facility]- Preparing For Surgery  Before surgery, you can play an important role. Because skin is not sterile, your skin needs to be as free of germs as possible. You can reduce the number of germs on your skin by washing with CHG (chlorahexidine gluconate) Soap before surgery.  CHG is an antiseptic cleaner which kills germs and bonds with the skin to continue killing germs even after washing.     Please do not use if you have an allergy to CHG or antibacterial soaps. If your skin becomes reddened/irritated stop using the CHG.  Do not shave (  including legs and underarms) for at least 48 hours prior to first CHG shower. It is OK to shave your face.  Please follow these instructions carefully.     Shower the NIGHT BEFORE SURGERY and the MORNING OF SURGERY with CHG Soap.   If you chose to wash your hair, wash your hair first as usual with your normal shampoo. After you shampoo, rinse your hair and body thoroughly to remove the shampoo.  Then [employer] and genitals (private parts) with your normal soap and rinse thoroughly to remove soap.  After that Use  CHG Soap as you would any other liquid soap. You can apply CHG directly to the skin and wash gently with a scrungie or a clean washcloth.   Apply the CHG Soap to your body ONLY FROM THE NECK DOWN.  Do not use on open wounds or open sores. Avoid contact with your eyes, ears, mouth and genitals (private parts). Wash Face and genitals (private parts)  with your normal soap.   Wash thoroughly, paying special attention to the area where your surgery will be performed.  Thoroughly rinse your body with warm water from the neck down.  DO NOT shower/wash with your normal soap after using and rinsing off the CHG Soap.  Pat yourself dry with a CLEAN TOWEL.  Wear CLEAN PAJAMAS to bed the night before surgery  Place CLEAN SHEETS on your bed the night before your surgery  DO NOT SLEEP WITH PETS.   Day of Surgery:  Take a shower with CHG soap. Wear Clean/Comfortable clothing the morning of surgery Do not apply any deodorants/lotions.   Remember to brush your teeth WITH YOUR REGULAR TOOTHPASTE.    If you received a COVID test during your pre-op visit, it is requested that you wear a mask when out in public, stay away from anyone that may not be feeling well, and notify your surgeon if you develop symptoms. If you have been in contact with anyone that has tested positive in the last 10 days, please notify your surgeon.    Please read over the following fact sheets that you were given.

## 2022-06-27 ENCOUNTER — Encounter (HOSPITAL_COMMUNITY): Payer: Self-pay

## 2022-06-27 ENCOUNTER — Encounter (HOSPITAL_COMMUNITY)
Admission: RE | Admit: 2022-06-27 | Discharge: 2022-06-27 | Disposition: A | Discharge status: Home / Self Care | Payer: PPO | Source: Ambulatory Visit | Attending: Neurological Surgery | Admitting: Neurological Surgery

## 2022-06-27 ENCOUNTER — Other Ambulatory Visit: Payer: Self-pay

## 2022-06-27 VITALS — BP 159/86 | HR 92 | Temp 97.6°F | Resp 18 | Ht 72.0 in | Wt 216.7 lb

## 2022-06-27 DIAGNOSIS — Z01818 Encounter for other preprocedural examination: Secondary | ICD-10-CM | POA: Insufficient documentation

## 2022-06-27 DIAGNOSIS — I480 Paroxysmal atrial fibrillation: Secondary | ICD-10-CM | POA: Insufficient documentation

## 2022-06-27 DIAGNOSIS — G4733 Obstructive sleep apnea (adult) (pediatric): Secondary | ICD-10-CM | POA: Diagnosis not present

## 2022-06-27 LAB — BASIC METABOLIC PANEL
Anion gap: 9 (ref 5–15)
BUN: 14 mg/dL (ref 8–23)
CO2: 24 mmol/L (ref 22–32)
Calcium: 9.4 mg/dL (ref 8.9–10.3)
Chloride: 106 mmol/L (ref 98–111)
Creatinine, Ser: 1.09 mg/dL (ref 0.61–1.24)
GFR, Estimated: >60 mL/min (ref >60)
Glucose, Bld: 100 mg/dL — ABNORMAL HIGH (ref 70–99)
Potassium: 4.2 mmol/L (ref 3.5–5.1)
Sodium: 139 mmol/L (ref 135–145)

## 2022-06-27 LAB — SURGICAL PCR SCREEN
MRSA, PCR: NEGATIVE
Staphylococcus aureus: NEGATIVE

## 2022-06-27 LAB — CBC
HCT: 45.4 % (ref 39.0–52.0)
Hemoglobin: 15 g/dL (ref 13.0–17.0)
MCH: 30.1 pg (ref 26.0–34.0)
MCHC: 33 g/dL (ref 30.0–36.0)
MCV: 91 fL (ref 80.0–100.0)
Platelets: 237 10*3/uL (ref 150–400)
RBC: 4.99 MIL/uL (ref 4.22–5.81)
RDW: 12.6 % (ref 11.5–15.5)
WBC: 9.4 10*3/uL (ref 4.0–10.5)
nRBC: 0 % (ref 0.0–0.2)

## 2022-06-27 LAB — TYPE AND SCREEN
ABO/RH(D): A POS
Antibody Screen: NEGATIVE

## 2022-06-27 NOTE — Progress Notes (Signed)
PCP - Dr. Crist Infante Cardiologist - DR. Virl Axe  PPM/ICD - Denies  Chest x-ray - 07/30/2018 EKG - 06/27/22 Stress Test - Denies ECHO - 03/08/18 Cardiac Cath - Denies  Sleep Study - Yes has OSA CPAP - Not using anymore since 2008 not needed  DM - Denies  Blood Thinner Instructions:n/a Aspirin Instructions:n/a  ERAS Protcol -Yes  COVID TEST- N/A  Had fever on Christmas eve that resolved on Christmas then had 2 days of runny nose.   Anesthesia review: Yes  Patient denies shortness of breath, fever, cough and chest pain at PAT appointment   All instructions explained to the patient, with a verbal understanding of the material. Patient agrees to go over the instructions while at home for a better understanding. The opportunity to ask questions was provided.

## 2022-06-30 NOTE — Anesthesia Preprocedure Evaluation (Signed)
Anesthesia Evaluation  Patient identified by MRN, date of birth, ID band Patient awake    Reviewed: Allergy & Precautions, NPO status , Patient's Chart, lab work & pertinent test results  Airway Mallampati: III  TM Distance: >3 FB Neck ROM: Limited    Dental  (+) Dental Advisory Given, Missing   Pulmonary sleep apnea    Pulmonary exam normal breath sounds clear to auscultation       Cardiovascular + dysrhythmias Atrial Fibrillation  Rhythm:Irregular Rate:Normal  Echo 2019 - Left ventricle: The cavity size was normal. Systolic function was    normal. The estimated ejection fraction was in the range of 55%    to 60%. Wall motion was normal; there were no regional wall    motion abnormalities. Left ventricular diastolic function    parameters were normal.  - Left atrium: The atrium was mildly dilated.  - Atrial septum: No defect or patent foramen ovale was identified.     Neuro/Psych History noted. CG    GI/Hepatic negative GI ROS, Neg liver ROS,,,  Endo/Other  negative endocrine ROS    Renal/GU negative Renal ROS     Musculoskeletal  (+) Arthritis ,    Abdominal   Peds  Hematology   Anesthesia Other Findings   Reproductive/Obstetrics                             Anesthesia Physical Anesthesia Plan  ASA: 3  Anesthesia Plan: General   Post-op Pain Management: Tylenol PO (pre-op)*   Induction: Intravenous  PONV Risk Score and Plan: 3 and Ondansetron, Dexamethasone, Midazolam and Treatment may vary due to age or medical condition  Airway Management Planned: Oral ETT and Video Laryngoscope Planned  Additional Equipment: ClearSight  Intra-op Plan:   Post-operative Plan: Extubation in OR  Informed Consent: I have reviewed the patients History and Physical, chart, labs and discussed the procedure including the risks, benefits and alternatives for the proposed anesthesia with the  patient or authorized representative who has indicated his/her understanding and acceptance.     Dental advisory given  Plan Discussed with: CRNA  Anesthesia Plan Comments: (PAT note by Karoline Caldwell, PA-C: Patient was previously seen by audiology for PACs and PVCs as well as an episode of perioperative atrial fibrillation that occurred during/after shoulder surgery in 2018.  Echo 02/2018 showed normal biventricular function, normal valves.  He was last seen by cardiology in March 2021 and was cleared to undergo lumbar fusion surgery.  At that time he was recommended follow-up with cardiology only on an as-needed basis.  History of OSA, not currently using CPAP.  Labs reviewed, WNL.  EKG 06/27/2022: Sinus rhythm with PACs.  Rate 81.  TTE 03/08/2018: - Left ventricle: The cavity size was normal. Systolic function was  normal. The estimated ejection fraction was in the range of 55%  to 60%. Wall motion was normal; there were no regional wall  motion abnormalities. Left ventricular diastolic function  parameters were normal.  - Left atrium: The atrium was mildly dilated.  - Atrial septum: No defect or patent foramen ovale was identified.    )        Anesthesia Quick Evaluation

## 2022-06-30 NOTE — Progress Notes (Signed)
Anesthesia Chart Review:  Patient was previously seen by audiology for PACs and PVCs as well as an episode of perioperative atrial fibrillation that occurred during/after shoulder surgery in 2018.  Echo 02/2018 showed normal biventricular function, normal valves.  He was last seen by cardiology in March 2021 and was cleared to undergo lumbar fusion surgery.  At that time he was recommended follow-up with cardiology only on an as-needed basis.  History of OSA, not currently using CPAP.  Labs reviewed, WNL.  EKG 06/27/2022: Sinus rhythm with PACs.  Rate 81.  TTE 03/08/2018: - Left ventricle: The cavity size was normal. Systolic function was    normal. The estimated ejection fraction was in the range of 55%    to 60%. Wall motion was normal; there were no regional wall    motion abnormalities. Left ventricular diastolic function    parameters were normal.  - Left atrium: The atrium was mildly dilated.  - Atrial septum: No defect or patent foramen ovale was identified.     Wynonia Musty [facility] Short Stay Center/Anesthesiology Phone [phone] 06/30/2022 4:11 PM

## 2022-07-02 ENCOUNTER — Inpatient Hospital Stay (HOSPITAL_COMMUNITY): Payer: PPO

## 2022-07-02 ENCOUNTER — Encounter (HOSPITAL_COMMUNITY): Payer: Self-pay | Admitting: Neurological Surgery

## 2022-07-02 ENCOUNTER — Inpatient Hospital Stay (HOSPITAL_COMMUNITY)
Admission: RE | Admit: 2022-07-02 | Discharge: 2022-07-03 | DRG: 472 | Disposition: A | Discharge status: Home / Self Care | Payer: PPO | Attending: Neurological Surgery | Admitting: Neurological Surgery

## 2022-07-02 ENCOUNTER — Other Ambulatory Visit: Payer: Self-pay

## 2022-07-02 ENCOUNTER — Encounter (HOSPITAL_COMMUNITY)
Admission: RE | Disposition: A | Discharge status: Home / Self Care | Payer: Self-pay | Source: Home / Self Care | Attending: Neurological Surgery

## 2022-07-02 ENCOUNTER — Inpatient Hospital Stay (HOSPITAL_COMMUNITY): Payer: PPO | Admitting: Physician Assistant

## 2022-07-02 ENCOUNTER — Inpatient Hospital Stay (HOSPITAL_COMMUNITY): Payer: PPO | Admitting: Certified Registered"

## 2022-07-02 DIAGNOSIS — M4802 Spinal stenosis, cervical region: Principal | ICD-10-CM | POA: Diagnosis present

## 2022-07-02 DIAGNOSIS — Z981 Arthrodesis status: Secondary | ICD-10-CM | POA: Diagnosis not present

## 2022-07-02 DIAGNOSIS — M5136 Other intervertebral disc degeneration, lumbar region: Secondary | ICD-10-CM | POA: Diagnosis not present

## 2022-07-02 DIAGNOSIS — M4722 Other spondylosis with radiculopathy, cervical region: Secondary | ICD-10-CM | POA: Diagnosis not present

## 2022-07-02 DIAGNOSIS — G5613 Other lesions of median nerve, bilateral upper limbs: Secondary | ICD-10-CM | POA: Diagnosis not present

## 2022-07-02 DIAGNOSIS — I480 Paroxysmal atrial fibrillation: Secondary | ICD-10-CM | POA: Diagnosis present

## 2022-07-02 DIAGNOSIS — Z79899 Other long term (current) drug therapy: Secondary | ICD-10-CM

## 2022-07-02 DIAGNOSIS — G5602 Carpal tunnel syndrome, left upper limb: Secondary | ICD-10-CM

## 2022-07-02 DIAGNOSIS — M2578 Osteophyte, vertebrae: Secondary | ICD-10-CM | POA: Diagnosis not present

## 2022-07-02 DIAGNOSIS — M4712 Other spondylosis with myelopathy, cervical region: Secondary | ICD-10-CM

## 2022-07-02 DIAGNOSIS — G4730 Sleep apnea, unspecified: Secondary | ICD-10-CM

## 2022-07-02 DIAGNOSIS — I4891 Unspecified atrial fibrillation: Secondary | ICD-10-CM | POA: Diagnosis not present

## 2022-07-02 DIAGNOSIS — Z8249 Family history of ischemic heart disease and other diseases of the circulatory system: Secondary | ICD-10-CM

## 2022-07-02 DIAGNOSIS — G5603 Carpal tunnel syndrome, bilateral upper limbs: Secondary | ICD-10-CM | POA: Diagnosis not present

## 2022-07-02 DIAGNOSIS — M5001 Cervical disc disorder with myelopathy,  high cervical region: Secondary | ICD-10-CM | POA: Diagnosis not present

## 2022-07-02 HISTORY — PX: ANTERIOR CERVICAL DECOMP/DISCECTOMY FUSION: SHX1161

## 2022-07-02 HISTORY — PX: CARPAL TUNNEL RELEASE: SHX101

## 2022-07-02 SURGERY — ANTERIOR CERVICAL DECOMPRESSION/DISCECTOMY FUSION 3 LEVELS
Anesthesia: General

## 2022-07-02 MED ORDER — OXYCODONE HCL 5 MG PO TABS
5.0000 mg | ORAL_TABLET | ORAL | Status: DC | PRN
  Administered 2022-07-02 – 2022-07-03 (×4): 5 mg via ORAL
  Filled 2022-07-02 (×4): qty 1

## 2022-07-02 MED ORDER — DEXAMETHASONE SODIUM PHOSPHATE 10 MG/ML IJ SOLN
INTRAMUSCULAR | Status: DC | PRN
  Administered 2022-07-02: 10 mg via INTRAVENOUS

## 2022-07-02 MED ORDER — PROPOFOL 10 MG/ML IV BOLUS
INTRAVENOUS | Status: AC
  Filled 2022-07-02: qty 20

## 2022-07-02 MED ORDER — LIDOCAINE 2% (20 MG/ML) 5 ML SYRINGE
INTRAMUSCULAR | Status: AC
  Filled 2022-07-02: qty 5

## 2022-07-02 MED ORDER — MEPERIDINE HCL 25 MG/ML IJ SOLN: 6.2500 mg | INTRAMUSCULAR | Status: DC | PRN

## 2022-07-02 MED ORDER — ROCURONIUM BROMIDE 100 MG/10ML IV SOLN
INTRAVENOUS | Status: DC | PRN
  Administered 2022-07-02: 60 mg via INTRAVENOUS
  Administered 2022-07-02: 20 mg via INTRAVENOUS
  Administered 2022-07-02: 50 mg via INTRAVENOUS
  Administered 2022-07-02: 40 mg via INTRAVENOUS

## 2022-07-02 MED ORDER — DEXAMETHASONE SODIUM PHOSPHATE 10 MG/ML IJ SOLN
INTRAMUSCULAR | Status: AC
  Filled 2022-07-02: qty 1

## 2022-07-02 MED ORDER — HYDROMORPHONE HCL 1 MG/ML IJ SOLN
INTRAMUSCULAR | Status: AC
  Administered 2022-07-02: 0.5 mg via INTRAVENOUS
  Filled 2022-07-02: qty 1

## 2022-07-02 MED ORDER — LACTATED RINGERS IV SOLN: INTRAVENOUS | Status: DC

## 2022-07-02 MED ORDER — HEMOSTATIC AGENTS (NO CHARGE) OPTIME
TOPICAL | Status: DC | PRN
  Administered 2022-07-02: 1 via TOPICAL

## 2022-07-02 MED ORDER — SODIUM CHLORIDE 0.9% FLUSH: 3.0000 mL | INTRAVENOUS | Status: DC | PRN

## 2022-07-02 MED ORDER — ORAL CARE MOUTH RINSE: 15.0000 mL | Freq: Once | OROMUCOSAL | Status: AC

## 2022-07-02 MED ORDER — THROMBIN 5000 UNITS EX SOLR
OROMUCOSAL | Status: DC | PRN
  Administered 2022-07-02: 5 mL via TOPICAL

## 2022-07-02 MED ORDER — PROMETHAZINE HCL 25 MG/ML IJ SOLN: 6.2500 mg | INTRAMUSCULAR | Status: DC | PRN

## 2022-07-02 MED ORDER — BUPIVACAINE HCL (PF) 0.25 % IJ SOLN
INTRAMUSCULAR | Status: DC | PRN
  Administered 2022-07-02: 5 mL

## 2022-07-02 MED ORDER — MIDAZOLAM HCL 2 MG/2ML IJ SOLN
INTRAMUSCULAR | Status: AC
  Filled 2022-07-02: qty 2

## 2022-07-02 MED ORDER — ACETAMINOPHEN 325 MG PO TABS: 650.0000 mg | ORAL_TABLET | ORAL | Status: DC | PRN

## 2022-07-02 MED ORDER — PHENYLEPHRINE HCL-NACL 20-0.9 MG/250ML-% IV SOLN
INTRAVENOUS | Status: DC | PRN
  Administered 2022-07-02: 20 ug/min via INTRAVENOUS

## 2022-07-02 MED ORDER — BUPIVACAINE HCL (PF) 0.25 % IJ SOLN
INTRAMUSCULAR | Status: AC
  Filled 2022-07-02: qty 30

## 2022-07-02 MED ORDER — CEFAZOLIN SODIUM 1 G IJ SOLR
INTRAMUSCULAR | Status: AC
  Filled 2022-07-02: qty 10

## 2022-07-02 MED ORDER — THROMBIN 5000 UNITS EX SOLR
CUTANEOUS | Status: AC
  Filled 2022-07-02: qty 15000

## 2022-07-02 MED ORDER — PHENOL 1.4 % MT LIQD: 1.0000 | OROMUCOSAL | Status: DC | PRN

## 2022-07-02 MED ORDER — LIDOCAINE HCL (CARDIAC) PF 100 MG/5ML IV SOSY
PREFILLED_SYRINGE | INTRAVENOUS | Status: DC | PRN
  Administered 2022-07-02: 100 mg via INTRATRACHEAL

## 2022-07-02 MED ORDER — DEXAMETHASONE 4 MG PO TABS
4.0000 mg | ORAL_TABLET | Freq: Four times a day (QID) | ORAL | Status: DC
  Administered 2022-07-02 – 2022-07-03 (×2): 4 mg via ORAL
  Filled 2022-07-02 (×2): qty 1

## 2022-07-02 MED ORDER — SENNA 8.6 MG PO TABS
1.0000 | ORAL_TABLET | Freq: Two times a day (BID) | ORAL | Status: DC
  Administered 2022-07-02 (×2): 8.6 mg via ORAL
  Filled 2022-07-02 (×2): qty 1

## 2022-07-02 MED ORDER — METHOCARBAMOL 500 MG PO TABS
500.0000 mg | ORAL_TABLET | Freq: Four times a day (QID) | ORAL | Status: DC | PRN
  Administered 2022-07-02 – 2022-07-03 (×3): 500 mg via ORAL
  Filled 2022-07-02 (×3): qty 1

## 2022-07-02 MED ORDER — ACETAMINOPHEN 650 MG RE SUPP: 650.0000 mg | RECTAL | Status: DC | PRN

## 2022-07-02 MED ORDER — CEFAZOLIN SODIUM-DEXTROSE 2-4 GM/100ML-% IV SOLN
2.0000 g | Freq: Three times a day (TID) | INTRAVENOUS | Status: AC
  Administered 2022-07-02 (×2): 2 g via INTRAVENOUS
  Filled 2022-07-02 (×2): qty 100

## 2022-07-02 MED ORDER — DEXAMETHASONE SODIUM PHOSPHATE 4 MG/ML IJ SOLN
4.0000 mg | Freq: Four times a day (QID) | INTRAMUSCULAR | Status: DC
  Administered 2022-07-02: 4 mg via INTRAVENOUS
  Filled 2022-07-02: qty 1

## 2022-07-02 MED ORDER — ROCURONIUM BROMIDE 10 MG/ML (PF) SYRINGE
PREFILLED_SYRINGE | INTRAVENOUS | Status: AC
  Filled 2022-07-02: qty 10

## 2022-07-02 MED ORDER — CHLORHEXIDINE GLUCONATE 0.12 % MT SOLN
15.0000 mL | Freq: Once | OROMUCOSAL | Status: AC
  Administered 2022-07-02: 15 mL via OROMUCOSAL
  Filled 2022-07-02: qty 15

## 2022-07-02 MED ORDER — MIDAZOLAM HCL 2 MG/2ML IJ SOLN
INTRAMUSCULAR | Status: DC | PRN
  Administered 2022-07-02: 2 mg via INTRAVENOUS

## 2022-07-02 MED ORDER — BACITRACIN ZINC 500 UNIT/GM EX OINT
TOPICAL_OINTMENT | CUTANEOUS | Status: AC
  Filled 2022-07-02: qty 28.35

## 2022-07-02 MED ORDER — POTASSIUM CHLORIDE IN NACL 20-0.9 MEQ/L-% IV SOLN: INTRAVENOUS | Status: DC

## 2022-07-02 MED ORDER — OXYCODONE HCL 5 MG PO TABS: 5.0000 mg | ORAL_TABLET | Freq: Once | ORAL | Status: AC | PRN

## 2022-07-02 MED ORDER — MENTHOL 3 MG MT LOZG: 1.0000 | LOZENGE | OROMUCOSAL | Status: DC | PRN

## 2022-07-02 MED ORDER — SUGAMMADEX SODIUM 200 MG/2ML IV SOLN
INTRAVENOUS | Status: DC | PRN
  Administered 2022-07-02 (×3): 100 mg via INTRAVENOUS

## 2022-07-02 MED ORDER — HYDROMORPHONE HCL 1 MG/ML IJ SOLN
0.2500 mg | INTRAMUSCULAR | Status: DC | PRN
  Administered 2022-07-02 (×2): 0.5 mg via INTRAVENOUS

## 2022-07-02 MED ORDER — ONDANSETRON HCL 4 MG/2ML IJ SOLN
INTRAMUSCULAR | Status: DC | PRN
  Administered 2022-07-02: 4 mg via INTRAVENOUS

## 2022-07-02 MED ORDER — MAGNESIUM OXIDE -MG SUPPLEMENT 400 (240 MG) MG PO TABS
600.0000 mg | ORAL_TABLET | Freq: Every day | ORAL | Status: DC
  Filled 2022-07-02: qty 2

## 2022-07-02 MED ORDER — ONDANSETRON HCL 4 MG/2ML IJ SOLN: 4.0000 mg | Freq: Four times a day (QID) | INTRAMUSCULAR | Status: DC | PRN

## 2022-07-02 MED ORDER — SODIUM CHLORIDE 0.9 % IV SOLN
250.0000 mL | INTRAVENOUS | Status: DC
  Administered 2022-07-02: 250 mL via INTRAVENOUS

## 2022-07-02 MED ORDER — KETAMINE HCL 50 MG/5ML IJ SOSY
PREFILLED_SYRINGE | INTRAMUSCULAR | Status: AC
  Filled 2022-07-02: qty 5

## 2022-07-02 MED ORDER — 0.9 % SODIUM CHLORIDE (POUR BTL) OPTIME
TOPICAL | Status: DC | PRN
  Administered 2022-07-02: 1000 mL

## 2022-07-02 MED ORDER — CEFAZOLIN SODIUM-DEXTROSE 2-3 GM-%(50ML) IV SOLR
INTRAVENOUS | Status: DC | PRN
  Administered 2022-07-02: 2 g via INTRAVENOUS

## 2022-07-02 MED ORDER — PROPOFOL 10 MG/ML IV BOLUS
INTRAVENOUS | Status: DC | PRN
  Administered 2022-07-02: 150 mg via INTRAVENOUS
  Administered 2022-07-02: 50 mg via INTRAVENOUS

## 2022-07-02 MED ORDER — PHENYLEPHRINE 80 MCG/ML (10ML) SYRINGE FOR IV PUSH (FOR BLOOD PRESSURE SUPPORT)
PREFILLED_SYRINGE | INTRAVENOUS | Status: DC | PRN
  Administered 2022-07-02 (×4): 160 ug via INTRAVENOUS

## 2022-07-02 MED ORDER — METHOCARBAMOL 500 MG PO TABS
ORAL_TABLET | ORAL | Status: AC
  Administered 2022-07-02: 500 mg via ORAL
  Filled 2022-07-02: qty 1

## 2022-07-02 MED ORDER — ONDANSETRON HCL 4 MG/2ML IJ SOLN
INTRAMUSCULAR | Status: AC
  Filled 2022-07-02: qty 2

## 2022-07-02 MED ORDER — HYDROMORPHONE HCL 1 MG/ML IJ SOLN
INTRAMUSCULAR | Status: AC
  Filled 2022-07-02: qty 0.5

## 2022-07-02 MED ORDER — MORPHINE SULFATE (PF) 2 MG/ML IV SOLN: 2.0000 mg | INTRAVENOUS | Status: DC | PRN

## 2022-07-02 MED ORDER — KETAMINE HCL 10 MG/ML IJ SOLN
INTRAMUSCULAR | Status: DC | PRN
  Administered 2022-07-02: 30 mg via INTRAVENOUS

## 2022-07-02 MED ORDER — METHOCARBAMOL 1000 MG/10ML IJ SOLN: 500.0000 mg | Freq: Four times a day (QID) | INTRAVENOUS | Status: DC | PRN

## 2022-07-02 MED ORDER — PHENYLEPHRINE HCL-NACL 20-0.9 MG/250ML-% IV SOLN
INTRAVENOUS | Status: AC
  Filled 2022-07-02: qty 250

## 2022-07-02 MED ORDER — OXYCODONE HCL 5 MG PO TABS
ORAL_TABLET | ORAL | Status: AC
  Administered 2022-07-02: 5 mg via ORAL
  Filled 2022-07-02: qty 1

## 2022-07-02 MED ORDER — ONDANSETRON HCL 4 MG PO TABS: 4.0000 mg | ORAL_TABLET | Freq: Four times a day (QID) | ORAL | Status: DC | PRN

## 2022-07-02 MED ORDER — SODIUM CHLORIDE 0.9% FLUSH
3.0000 mL | Freq: Two times a day (BID) | INTRAVENOUS | Status: DC
  Administered 2022-07-02 (×2): 3 mL via INTRAVENOUS

## 2022-07-02 MED ORDER — PHENYLEPHRINE HCL (PRESSORS) 10 MG/ML IV SOLN
INTRAVENOUS | Status: DC | PRN
  Administered 2022-07-02: 80 ug via INTRAVENOUS

## 2022-07-02 MED ORDER — HYDROMORPHONE HCL 1 MG/ML IJ SOLN
INTRAMUSCULAR | Status: DC | PRN
  Administered 2022-07-02: .5 mg via INTRAVENOUS

## 2022-07-02 MED ORDER — FENTANYL CITRATE (PF) 250 MCG/5ML IJ SOLN
INTRAMUSCULAR | Status: DC | PRN
  Administered 2022-07-02: 100 ug via INTRAVENOUS
  Administered 2022-07-02: 50 ug via INTRAVENOUS
  Administered 2022-07-02: 100 ug via INTRAVENOUS

## 2022-07-02 MED ORDER — OXYCODONE HCL 5 MG/5ML PO SOLN: 5.0000 mg | Freq: Once | ORAL | Status: AC | PRN

## 2022-07-02 MED ORDER — AMISULPRIDE (ANTIEMETIC) 5 MG/2ML IV SOLN: 10.0000 mg | Freq: Once | INTRAVENOUS | Status: DC | PRN

## 2022-07-02 MED ORDER — FENTANYL CITRATE (PF) 250 MCG/5ML IJ SOLN
INTRAMUSCULAR | Status: AC
  Filled 2022-07-02: qty 5

## 2022-07-02 SURGICAL SUPPLY — 75 items
APL SKNCLS STERI-STRIP NONHPOA (GAUZE/BANDAGES/DRESSINGS) ×2
BAG COUNTER SPONGE SURGICOUNT (BAG) ×4 IMPLANT
BAG SPNG CNTER NS LX DISP (BAG) ×4
BAND INSRT 18 STRL LF DISP RB (MISCELLANEOUS) ×4
BAND RUBBER #18 3X1/16 STRL (MISCELLANEOUS) ×4 IMPLANT
BASKET BONE COLLECTION (BASKET) IMPLANT
BENZOIN TINCTURE PRP APPL 2/3 (GAUZE/BANDAGES/DRESSINGS) ×2 IMPLANT
BLADE SURG 15 STRL LF DISP TIS (BLADE) ×2 IMPLANT
BLADE SURG 15 STRL SS (BLADE) ×2
BNDG CMPR 75X41 PLY HI ABS (GAUZE/BANDAGES/DRESSINGS) ×2
BNDG ELASTIC 4X5.8 VLCR STR LF (GAUZE/BANDAGES/DRESSINGS) ×2 IMPLANT
BNDG GAUZE DERMACEA FLUFF 4 (GAUZE/BANDAGES/DRESSINGS) ×2 IMPLANT
BNDG GZE DERMACEA 4 6PLY (GAUZE/BANDAGES/DRESSINGS) ×2
BNDG STRETCH 4X75 STRL LF (GAUZE/BANDAGES/DRESSINGS) IMPLANT
BUR CARBIDE MATCH 3.0 (BURR) ×2 IMPLANT
CABLE BIPOLOR RESECTION CORD (MISCELLANEOUS) ×2 IMPLANT
CANISTER SUCT 3000ML PPV (MISCELLANEOUS) ×4 IMPLANT
DRAIN JACKSON PRT FLT 7MM (DRAIN) IMPLANT
DRAPE C-ARM 42X72 X-RAY (DRAPES) ×4 IMPLANT
DRAPE EXTREMITY T 121X128X90 (DISPOSABLE) ×2 IMPLANT
DRAPE HALF SHEET 40X57 (DRAPES) ×2 IMPLANT
DRAPE LAPAROTOMY 100X72 PEDS (DRAPES) ×2 IMPLANT
DRAPE MICROSCOPE SLANT 54X150 (MISCELLANEOUS) ×2 IMPLANT
DRSG EMULSION OIL 3X3 NADH (GAUZE/BANDAGES/DRESSINGS) IMPLANT
DRSG OPSITE POSTOP 4X6 (GAUZE/BANDAGES/DRESSINGS) IMPLANT
DURAPREP 26ML APPLICATOR (WOUND CARE) ×2 IMPLANT
DURAPREP 6ML APPLICATOR 50/CS (WOUND CARE) ×2 IMPLANT
ELECT COATED BLADE 2.86 ST (ELECTRODE) ×2 IMPLANT
ELECT REM PT RETURN 9FT ADLT (ELECTROSURGICAL) ×2
ELECTRODE REM PT RTRN 9FT ADLT (ELECTROSURGICAL) ×2 IMPLANT
EVACUATOR SILICONE 100CC (DRAIN) IMPLANT
GAUZE 4X4 16PLY ~~LOC~~+RFID DBL (SPONGE) ×2 IMPLANT
GAUZE SPONGE 4X4 12PLY STRL (GAUZE/BANDAGES/DRESSINGS) ×2 IMPLANT
GLOVE BIO SURGEON STRL SZ7 (GLOVE) IMPLANT
GLOVE BIO SURGEON STRL SZ8 (GLOVE) ×4 IMPLANT
GLOVE BIOGEL PI IND STRL 7.0 (GLOVE) IMPLANT
GOWN STRL REUS W/ TWL LRG LVL3 (GOWN DISPOSABLE) IMPLANT
GOWN STRL REUS W/ TWL XL LVL3 (GOWN DISPOSABLE) ×2 IMPLANT
GOWN STRL REUS W/TWL 2XL LVL3 (GOWN DISPOSABLE) ×2 IMPLANT
GOWN STRL REUS W/TWL LRG LVL3 (GOWN DISPOSABLE) ×4
GOWN STRL REUS W/TWL XL LVL3 (GOWN DISPOSABLE) ×6
GRAFT IFACTOR PEPTIDE 2.5CC (Graft) IMPLANT
HEMOSTAT POWDER KIT SURGIFOAM (HEMOSTASIS) ×2 IMPLANT
KIT BASIN OR (CUSTOM PROCEDURE TRAY) ×4 IMPLANT
KIT TURNOVER KIT B (KITS) ×4 IMPLANT
NDL HYPO 25X1 1.5 SAFETY (NEEDLE) ×4 IMPLANT
NDL SPNL 20GX3.5 QUINCKE YW (NEEDLE) ×2 IMPLANT
NEEDLE HYPO 25X1 1.5 SAFETY (NEEDLE) ×4 IMPLANT
NEEDLE SPNL 20GX3.5 QUINCKE YW (NEEDLE) ×2 IMPLANT
NS IRRIG 1000ML POUR BTL (IV SOLUTION) ×4 IMPLANT
PACK BASIC III (CUSTOM PROCEDURE TRAY) ×2
PACK LAMINECTOMY NEURO (CUSTOM PROCEDURE TRAY) ×2 IMPLANT
PACK SRG BSC III STRL LF ECLPS (CUSTOM PROCEDURE TRAY) ×2 IMPLANT
PAD ARMBOARD 7.5X6 YLW CONV (MISCELLANEOUS) ×4 IMPLANT
PIN DISTRACTION 14MM (PIN) IMPLANT
PLATE ACP 60X3 LVL NS LF SPNE (Plate) IMPLANT
PLATE ACP INSIGNIA 60 3L (Plate) ×2 IMPLANT
SCREW VA SINGLE LEAD 4X16 (Screw) IMPLANT
SPACER IDENTITI 7X18X16 7D (Spacer) IMPLANT
SPACER IDENTITI PS 6X18X16 7D (Spacer) IMPLANT
SPONGE INTESTINAL PEANUT (DISPOSABLE) ×2 IMPLANT
SPONGE SURGIFOAM ABS GEL 100 (HEMOSTASIS) IMPLANT
SPONGE SURGIFOAM ABS GEL SZ50 (HEMOSTASIS) IMPLANT
STOCKINETTE 4X48 STRL (DRAPES) ×2 IMPLANT
STRIP CLOSURE SKIN 1/2X4 (GAUZE/BANDAGES/DRESSINGS) ×2 IMPLANT
SUT ETHILON 4 0 PS 2 18 (SUTURE) ×2 IMPLANT
SUT VIC AB 3-0 SH 8-18 (SUTURE) ×4 IMPLANT
SUT VICRYL 4-0 PS2 18IN ABS (SUTURE) IMPLANT
SYR BULB EAR ULCER 3OZ GRN STR (SYRINGE) ×2 IMPLANT
SYR CONTROL 10ML LL (SYRINGE) ×2 IMPLANT
TOWEL GREEN STERILE (TOWEL DISPOSABLE) ×4 IMPLANT
TOWEL GREEN STERILE FF (TOWEL DISPOSABLE) ×4 IMPLANT
TUBE CONNECTING 12X1/4 (SUCTIONS) ×2 IMPLANT
UNDERPAD 30X36 HEAVY ABSORB (UNDERPADS AND DIAPERS) ×2 IMPLANT
WATER STERILE IRR 1000ML POUR (IV SOLUTION) ×4 IMPLANT

## 2022-07-02 NOTE — Anesthesia Postprocedure Evaluation (Signed)
Anesthesia Post Note  Patient: Kevin Griffith  Procedure(s) Performed: Anterior Cervical Decompression Fusion  - Cervical three-Cervical four - Cervical four-Cervical five - Cervical five-Cervical six Left carpal tunnel release (Left)     Patient location during evaluation: PACU Anesthesia Type: General Level of consciousness: sedated and patient cooperative Pain management: pain level controlled Vital Signs Assessment: post-procedure vital signs reviewed and stable Respiratory status: spontaneous breathing Cardiovascular status: stable Anesthetic complications: no   No notable events documented.  Last Vitals:  Vitals:   07/02/22 1817 07/02/22 1925  BP: (!) 158/87 (!) 148/89  Pulse: 87 85  Resp: 16 18  Temp: 37.1 C 36.4 C  SpO2: 96% 97%    Last Pain:  Vitals:   07/02/22 1925  TempSrc: Oral  PainSc: [name]

## 2022-07-02 NOTE — Anesthesia Procedure Notes (Signed)
Procedure Name: Intubation Date/Time: 07/02/2022 9:02 AM  Performed by: Reggie Pile, CRNAPre-anesthesia Checklist: Patient identified, Emergency Drugs available, Suction available and Patient being monitored Patient Re-evaluated:Patient Re-evaluated prior to induction Oxygen Delivery Method: Circle system utilized Preoxygenation: Pre-oxygenation with 100% oxygen Induction Type: IV induction Ventilation: Two handed mask ventilation required Laryngoscope Size: Glidescope Grade View: Grade I Tube type: Oral Tube size: 7.0 mm Number of attempts: 1 Airway Equipment and Method: Stylet (lopro 3) Placement Confirmation: ETT inserted through vocal cords under direct vision, positive ETCO2 and breath sounds checked- equal and bilateral Secured at: 22 cm Tube secured with: Tape Dental Injury: Teeth and Oropharynx as per pre-operative assessment  Comments: Short TMD, glidescope used in previous laminectomy and this surgery for ACDF

## 2022-07-02 NOTE — Transfer of Care (Signed)
Immediate Anesthesia Transfer of Care Note  Patient: Kevin Griffith  Procedure(s) Performed: Anterior Cervical Decompression Fusion  - Cervical three-Cervical four - Cervical four-Cervical five - Cervical five-Cervical six Left carpal tunnel release (Left)  Patient Location: PACU  Anesthesia Type:General  Level of Consciousness: awake  Airway & Oxygen Therapy: Patient Spontanous Breathing  Post-op Assessment: Report given to RN  Post vital signs: Reviewed  Last Vitals:  Vitals Value Taken Time  BP 165/89 07/02/22 1233  Temp    Pulse 76 07/02/22 1239  Resp 23 07/02/22 1239  SpO2 96 % 07/02/22 1239  Vitals shown include unvalidated device data.  Last Pain:  Vitals:   07/02/22 0706  TempSrc: Oral  PainSc: 7       Patients Stated Pain Goal: 0 ([id number])  Complications: No notable events documented.

## 2022-07-02 NOTE — H&P (Addendum)
Subjective:   Patient is a 72 y.o. male admitted for neck and arm pain. The patient first presented to me with complaints of neck pain, shooting pains in the arm(s), and numbness of the arm(s). Onset of symptoms was several months ago. The pain is described as sharp and like an electric shock and occurs intermittently. The pain is rated severe, and is located in the neck and radiates to the LUE. The symptoms have been progressive. Symptoms are exacerbated by extending head backwards, and are relieved by none.  Previous work up includes MRI of cervical spine, results: spinal stenosis.also has L CTS by EMG  Past Medical History:  Diagnosis Date   Asymptomatic microscopic hematuria    Back pain, chronic    Cervical stenosis of spine    Colon polyps    DDD (degenerative disc disease), cervical    DDD (degenerative disc disease), lumbar    Drug rash    ED (erectile dysfunction)    Hyperbilirubinemia    Hypertriglyceridemia    IFG (impaired fasting glucose)    Insomnia disorder    Lactose intolerance    Mood change    MRSA (methicillin resistant Staphylococcus aureus)    Palpitations    Paroxysmal A-fib (HCC)    Skin problem    Sleep apnea    no cpap   Sleep apnea, obstructive    Vitamin D deficiency     Past Surgical History:  Procedure Laterality Date   [place], 07-2001   HEMIDISCECECTOMY     SHOULDER SURGERY Right     Allergies  Allergen Reactions   Acetaminophen Other (See Comments)    Increases blood pressure    Ibuprofen Other (See Comments)    Muscle cramping    Tizanidine     Urinary retention issues   Sulfa Antibiotics Rash    Social History   Tobacco Use   Smoking status: Never   Smokeless tobacco: Never  Substance Use Topics   Alcohol use: Yes    Alcohol/week: 3.0 standard drinks of alcohol    Types: 3 Glasses of wine per week    Comment: occ wine or beer 2-3X/week    Family History  Problem Relation Age of Onset    Cerebral aneurysm Mother    Hypertension Mother    Heart attack Sister 37       oldest sister   Crohn's disease Brother    Diabetes Mellitus II Sister    Heart disease Sister    Crohn's disease Brother    Colon cancer Neg Hx    Colon polyps Neg Hx    Esophageal cancer Neg Hx    Stomach cancer Neg Hx    Rectal cancer Neg Hx    Prior to Admission medications   Medication Sig Start Date End Date Taking? Authorizing Provider  Ascorbic Acid (VITAMIN C) 1000 MG tablet Take 3,000 mg by mouth daily.   Yes [provider]  B Complex Vitamins (VITAMIN-B COMPLEX PO) Take 1 tablet by mouth daily.   Yes [provider]  Cholecalciferol (VITAMIN D3) 10000 units capsule Take 10,000 Units by mouth daily.   Yes [provider]  Cyanocobalamin (B-12 PO) Take 1 tablet by mouth daily.   Yes [provider]  Digestive Enzymes (MULTI-ENZYME PO) Take 2 capsules by mouth daily before supper.   Yes [provider]  MAGNESIUM GLYCINATE PO Take 600 mg by mouth at bedtime.   Yes [provider]  Menaquinone-7 (VITAMIN K2 PO) Take 500 mcg by mouth daily.   Yes [provider]  Multiple Minerals (MINERAL COMPLEX PO) Take 3 tablets by mouth daily.    Yes [provider]  Omega-3 Fatty Acids (OMEGA 3 PO) Take 2 capsules by mouth daily.   Yes [provider]  Probiotic Product (PROBIOTIC PO) Take 1 capsule by mouth daily.   Yes [provider]  sildenafil (REVATIO) 20 MG tablet Take 40-60 mg by mouth daily as needed (ED).   Yes [provider]  traMADol (ULTRAM) 50 MG tablet Take 50 mg by mouth every 6 (six) hours as needed for moderate pain. 06/13/22  Yes [provider]  Vitamin A 10000 units TABS Take 10,000 Units by mouth daily.    Yes [provider]  vitamin E 400 UNIT capsule Take 400 Units by mouth 3 (three) times a week.   Yes [provider]     Review of Systems  Positive ROS:  neg  All other systems have been reviewed and were otherwise negative with the exception of those mentioned in the HPI and as above.  Objective: Vital signs in last 24 hours: Temp:  [97.8 F (36.6 C)] 97.8 F (36.6 C) (03/06 0706) Pulse Rate:  [82] 82 (03/06 0706) Resp:  [16] 16 (03/06 0706) BP: (145)/(87) 145/87 (03/06 0706) SpO2:  [97 %] 97 % (03/06 0706) Weight:  [98.2 kg] 98.2 kg (03/06 0706)  General Appearance: Alert, cooperative, no distress, appears stated age Head: Normocephalic, without obvious abnormality, atraumatic Eyes: PERRL, conjunctiva/corneas clear, EOM's intact      Neck: Supple, symmetrical, trachea midline, Back: Symmetric, no curvature, ROM normal, no CVA tenderness Lungs:  respirations unlabored Heart: Regular rate and rhythm Abdomen: Soft, non-tender Extremities: Extremities normal, atraumatic, no cyanosis or edema Pulses: 2+ and symmetric all extremities Skin: Skin color, texture, turgor normal, no rashes or lesions  NEUROLOGIC:  Mental status: Alert and oriented x4, no aphasia, good attention span, fund of knowledge and memory  Motor Exam - grossly normal Sensory Exam - grossly normal Reflexes: 2+ Coordination - grossly normal Gait - grossly normal Balance - grossly normal Cranial Nerves: I: smell Not tested  II: visual acuity  OS: nl    OD: nl  II: visual fields Full to confrontation  II: pupils Equal, round, reactive to light  III,VII: ptosis None  III,IV,VI: extraocular muscles  Full ROM  V: mastication Normal  V: facial light touch sensation  Normal  V,VII: corneal reflex  Present  VII: facial muscle function - upper  Normal  VII: facial muscle function - lower Normal  VIII: hearing Not tested  IX: soft palate elevation  Normal  IX,X: gag reflex Present  XI: trapezius strength  5/5  XI: sternocleidomastoid strength 5/5  XI: neck flexion strength  5/5  XII: tongue strength  Normal    Data Review Lab Results  Component Value Date    WBC 9.4 06/27/2022   HGB 15.0 06/27/2022   HCT 45.4 06/27/2022   MCV 91.0 06/27/2022   PLT 237 06/27/2022   Lab Results  Component Value Date   NA 139 06/27/2022   K 4.2 06/27/2022   CL 106 06/27/2022   CO2 24 06/27/2022   BUN 14 06/27/2022   CREATININE 1.09 06/27/2022   GLUCOSE 100 (H) 06/27/2022   Lab Results  Component Value Date   INR 1.1 07/30/2018    Assessment:   Cervical neck pain with herniated nucleus pulposus/ spondylosis/ stenosis at C3-6.  Estimated body mass index is 29.36 kg/m as calculated from the following:   Height as of this encounter: 6' (1.829 m).   Weight as of this encounter: 98.2 kg.  Patient has failed conservative therapy. Planned surgery : ACDF C3-4 C4-5 C5-6 and L CTR  Plan:   I explained the condition and procedure to the patient and answered any questions.  Patient wishes to proceed with procedure as planned. Understands risks/ benefits/ and expected or typical outcomes.  Eustace Moore 07/02/2022 8:15 AM

## 2022-07-02 NOTE — Op Note (Signed)
07/02/2022  12:22 PM  PATIENT:  Kevin Griffith  72 y.o. male  PRE-OPERATIVE DIAGNOSIS: Cervical spondylosis with cervical spinal stenosis C3-4 C4-5 C5-6 with cervical radiculopathy and myelopathy.  Bilateral median neuropathy  POST-OPERATIVE DIAGNOSIS:  same  PROCEDURE:  1. Decompressive anterior cervical discectomy C3-4 C4-5 C5-6, 2. Anterior cervical arthrodesis C3-4 C4-5 C5-6 utilizing a PTI interbody cage packed with locally harvested morcellized autologous bone graft and I factor, 3. Anterior cervical plating C3-C6 inclusive utilizing a ATEC plate, 4.  Left carpal tunnel release  SURGEON:  Sherley Bounds, MD  ASSISTANTS: Glenford Peers, FNP  ANESTHESIA:   General  EBL: 75 ml  Total I/O In: 900 [I.V.:900] Out: 75 [Blood:75]  BLOOD ADMINISTERED: none  DRAINS: none  SPECIMEN:  none  INDICATION FOR PROCEDURE: This patient presented with neck pain with shooting pains in the left arm and difficulty with gait.  He had numbness in the left hand more than the right hand.. Imaging showed spinal stenosis at C3-4 C4-5 C5-6 and nerve conduction study showed bilateral median neuropathy. The patient tried conservative measures without relief. Pain was debilitating. Recommended ACDF with plating and a planned staged bilateral carpal tunnel release starting on the left. Patient understood the risks, benefits, and alternatives and potential outcomes and wished to proceed.  PROCEDURE DETAILS: Patient was brought to the operating room placed under general endotracheal anesthesia. Patient was placed in the supine position on the operating room table.  Left arm was extended on an armboard and prepped circumferentially from the fingertips to the elbow utilizing Betadine scrub followed by DuraPrep.  5 cc of local anesthesia was injected and a palmar incision was made from the distal wrist crease into the palm in line with the webspace between the middle finger and ring finger.  We dissected down through the  palmar fascia to identify the transverse carpal ligament.  The ligament was opened to identify the underlying median nerve.  We then spread between the ligament and the nerve with a hemostat and completely transected the ligament both proximally and distally until the nerve was free.  We then palpated with a hemostat to assure adequate decompression of the nerve.  Irrigated.  We dried the surgical bed with bipolar cautery.  We inspected the nerve once again.  We closed the palmar fascia with single 3-0 Vicryl in the subcuticular tissue with 3-0 Vicryl.  The skin was closed with interrupted 4-0 Ethilon vertical mattress sutures.  We then dressed the wound with antibiotic ointment and Adaptic, and then wrapped the hand in Kerlix and an Ace bandage.  The patient was then positioned for anterior cervical discectomy.   The neck was prepped with Duraprep and draped in a sterile fashion.   Three cc of local anesthesia was injected and a transverse incision was made on the right side of the neck.  Dissection was carried down thru the subcutaneous tissue and the platysma was  elevated, opened, and undermined with Metzenbaum scissors.  Dissection was then carried out thru an avascular plane leaving the sternocleidomastoid carotid artery and jugular vein laterally and the trachea and esophagus medially with the assistance of my nurse practitioner. The ventral aspect of the vertebral column was identified and a localizing x-ray was taken. The C4-5 level was identified and all in the room agreed with the level. The longus colli muscles were then elevated and the retractor was placed with the assistance of my nurse practitioner to expose C3-4 C4-5 and C5-6. The annulus was incised at each level  and the disc space entered. Discectomy was performed with micro-curettes and pituitary rongeurs. I then used the high-speed drill to drill the endplates down to the level of the posterior longitudinal ligament. The drill shavings were  saved in a mucous trap for later arthrodesis. The operating microscope was draped and brought into the field provided additional magnification, illumination and visualization. Discectomy was continued posteriorly thru the disc space. Posterior longitudinal ligament was opened with a nerve hook, and then removed along with disc herniation and osteophytes, decompressing the spinal canal and thecal sac. We then continued to remove osteophytic overgrowth and disc material decompressing the neural foramina and exiting nerve roots bilaterally. The scope was angled up and down to help decompress and undercut the vertebral bodies. Once the decompression was completed we could pass a nerve hook circumferentially to assure adequate decompression in the midline and in the neural foramina. So by both visualization and palpation we felt we had an adequate decompression of the neural elements. We then measured the height of the intravertebral disc spaces and selected a 7 millimeter PTI interbody cage packed with autograft and I factor for each interspace. It was then gently positioned in the intravertebral disc space(s) and countersunk. I then used a 60 mm ATEC plate and 16 mm placed variable angle screws into the vertebral bodies of each level and locked them into position. The wound was irrigated with bacitracin solution, checked for hemostasis which was established and confirmed. Once meticulous hemostasis was achieved, we then proceeded with closure with the assistance of my nurse practitioner. The platysma was closed with interrupted 3-0 undyed Vicryl suture, the subcuticular layer was closed with interrupted 3-0 undyed Vicryl suture. The skin edges were approximated with steristrips. The drapes were removed. A sterile dressing was applied. The patient was then awakened from general anesthesia and transferred to the recovery room in stable condition. At the end of the procedure all sponge, needle and instrument counts were  correct.   PLAN OF CARE: Admit to inpatient   PATIENT DISPOSITION:  PACU - hemodynamically stable.   Delay start of Pharmacological VTE agent (>24hrs) due to surgical blood loss or risk of bleeding:  yes

## 2022-07-03 ENCOUNTER — Encounter (HOSPITAL_COMMUNITY): Payer: Self-pay | Admitting: Neurological Surgery

## 2022-07-03 MED ORDER — METHOCARBAMOL 500 MG PO TABS: 500.0000 mg | ORAL_TABLET | Freq: Four times a day (QID) | ORAL | 1 refills | Status: DC | PRN

## 2022-07-03 MED ORDER — OXYCODONE HCL 5 MG PO TABS
5.0000 mg | ORAL_TABLET | ORAL | Status: DC | PRN
  Administered 2022-07-03: 10 mg via ORAL
  Administered 2022-07-03: 5 mg via ORAL
  Filled 2022-07-03: qty 1
  Filled 2022-07-03: qty 2

## 2022-07-03 MED ORDER — OXYCODONE HCL 5 MG PO TABS: 5.0000 mg | ORAL_TABLET | Freq: Four times a day (QID) | ORAL | 0 refills | Status: DC | PRN

## 2022-07-03 NOTE — Discharge Summary (Signed)
Physician Discharge Summary  Patient ID: Kevin Griffith MRN: [id number] DOB/AGE: 1951/04/18 72 y.o.  Admit date: 07/02/2022 Discharge date: 07/03/2022  Admission Diagnoses: Cervical spinal stenosis with cervical spondylitic myelopathy, left carpal tunnel syndrome    Discharge Diagnoses: Same   Discharged Condition: Good  Hospital Course: The patient was admitted on 07/02/2022 and taken to the operating room where the patient underwent ACDF C3-4 C4-5 C5-6 and a left carpal tunnel release. The patient tolerated the procedure well and was taken to the recovery room and then to the floor in stable condition. The hospital course was routine. There were no complications. The wound remained clean dry and intact. Pt had appropriate neck soreness. No complaints of arm pain or new N/T/W. The patient remained afebrile with stable vital signs, and tolerated a regular diet. The patient continued to increase activities, and pain was well controlled with oral pain medications.   Consults: none  Significant Diagnostic Studies:  Results for orders placed or performed during the hospital encounter of 06/27/22  Surgical pcr screen   Specimen: Nasal Mucosa; Nasal Swab  Result Value Ref Range   MRSA, PCR NEGATIVE NEGATIVE   Staphylococcus aureus NEGATIVE NEGATIVE  Basic metabolic panel per protocol  Result Value Ref Range   Sodium 139 135 - 145 mmol/L   Potassium 4.2 3.5 - 5.1 mmol/L   Chloride 106 98 - 111 mmol/L   CO2 24 22 - 32 mmol/L   Glucose, Bld 100 (H) 70 - 99 mg/dL   BUN 14 8 - 23 mg/dL   Creatinine, Ser 1.09 0.61 - 1.24 mg/dL   Calcium 9.4 8.9 - 10.3 mg/dL   GFR, Estimated >60 >60 mL/min   Anion gap 9 5 - 15  CBC per protocol  Result Value Ref Range   WBC 9.4 4.0 - 10.5 K/uL   RBC 4.99 4.22 - 5.81 MIL/uL   Hemoglobin 15.0 13.0 - 17.0 g/dL   HCT 45.4 39.0 - 52.0 %   MCV 91.0 80.0 - 100.0 fL   MCH 30.1 26.0 - 34.0 pg   MCHC 33.0 30.0 - 36.0 g/dL   RDW 12.6 11.5 - 15.5 %   Platelets 237  150 - 400 K/uL   nRBC 0.0 0.0 - 0.2 %  Type and screen [place]  Result Value Ref Range   ABO/RH(D) A POS    Antibody Screen NEG    Sample Expiration 07/11/2022,2359    Extend sample reason      NO TRANSFUSIONS OR PREGNANCY IN THE PAST 3 MONTHS Performed at [facility], [address] [place], [place] [address]     DG Cervical Spine 2 or 3 views  Result Date: 07/02/2022 CLINICAL DATA:  [id number] Surgery, elective [id number] EXAM: CERVICAL SPINE - 2-3 VIEW COMPARISON:  MRI 04/29/2022 FINDINGS: Intraoperative images during C3-C6 ACDF. Intact hardware. No evidence of immediate complication. IMPRESSION: Intraoperative images during C3-C6 ACDF. Hardware is intact. No evidence of immediate complication. Electronically Signed   By: Maurine Simmering M.D.   On: 07/02/2022 13:01   DG C-Arm 1-60 Min-No Report  Result Date: 07/02/2022 Fluoroscopy was utilized by the requesting physician.  No radiographic interpretation.   DG C-Arm 1-60 Min-No Report  Result Date: 07/02/2022 Fluoroscopy was utilized by the requesting physician.  No radiographic interpretation.   DG C-Arm 1-60 Min-No Report  Result Date: 07/02/2022 Fluoroscopy was utilized by the requesting physician.  No radiographic interpretation.    Antibiotics:  Anti-infectives (From admission, onward)  Start     Dose/Rate Route Frequency Ordered Stop   07/02/22 1515  ceFAZolin (ANCEF) IVPB 2g/100 mL premix        2 g 200 mL/hr over 30 Minutes Intravenous Every 8 hours 07/02/22 1429 07/02/22 2324       Discharge Exam: Blood pressure 131/87, pulse 79, temperature 98.5 F (36.9 C), temperature source Oral, resp. rate 18, height 6' (1.829 m), weight 98.2 kg, SpO2 97 %. Neurologic: Grossly normal Dressing clean dry and intact  Discharge Medications:   Allergies as of 07/03/2022       Reactions   Acetaminophen Other (See Comments)   Increases blood pressure   Ibuprofen Other (See Comments)   Muscle cramping     Tizanidine    Urinary retention issues   Sulfa Antibiotics Rash        Medication List     TAKE these medications    B-12 PO Take 1 tablet by mouth daily.   MAGNESIUM GLYCINATE PO Take 600 mg by mouth at bedtime.   methocarbamol 500 MG tablet Commonly known as: ROBAXIN Take 1 tablet (500 mg total) by mouth every 6 (six) hours as needed for muscle spasms.   MINERAL COMPLEX PO Take 3 tablets by mouth daily.   MULTI-ENZYME PO Take 2 capsules by mouth daily before supper.   OMEGA 3 PO Take 2 capsules by mouth daily.   oxyCODONE 5 MG immediate release tablet Commonly known as: Oxy IR/ROXICODONE Take 1-2 tablets (5-10 mg total) by mouth every 6 (six) hours as needed for moderate pain ((score 4 to 6)).   PROBIOTIC PO Take 1 capsule by mouth daily.   sildenafil 20 MG tablet Commonly known as: REVATIO Take 40-60 mg by mouth daily as needed (ED).   traMADol 50 MG tablet Commonly known as: ULTRAM Take 50 mg by mouth every 6 (six) hours as needed for moderate pain.   Vitamin A 10000 units Tabs Take 10,000 Units by mouth daily.   vitamin C 1000 MG tablet Take 3,000 mg by mouth daily.   Vitamin D3 250 MCG (10000 UT) Caps Generic drug: Cholecalciferol Take 10,000 Units by mouth daily.   vitamin E 180 MG (400 UNITS) capsule Take 400 Units by mouth 3 (three) times a week.   VITAMIN K2 PO Take 500 mcg by mouth daily.   VITAMIN-B COMPLEX PO Take 1 tablet by mouth daily.        Disposition: Home   Final Dx: ACDF with plating C3-4 C4-5 C5-6 and left carpal tunnel release  Discharge Instructions     Call MD for:  difficulty breathing, headache or visual disturbances   Complete by: As directed    Call MD for:  persistant nausea and vomiting   Complete by: As directed    Call MD for:  redness, tenderness, or signs of infection (pain, swelling, redness, odor or green/yellow discharge around incision site)   Complete by: As directed    Call MD for:  severe  uncontrolled pain   Complete by: As directed    Call MD for:  temperature >100.4   Complete by: As directed    Diet - low sodium heart healthy   Complete by: As directed    Increase activity slowly   Complete by: As directed    No wound care   Complete by: As directed           Signed: Eustace Moore 07/03/2022, 7:36 AM

## 2022-07-03 NOTE — Evaluation (Signed)
Occupational Therapy Evaluation Patient Details Name: Kevin Griffith MRN: [id number] DOB: [birth year] Today's Date: 07/03/2022   History of Present Illness Pt s/p ACDF C3-6 and L carpal tunnel release. PMH significant for DDD, ED, hyperbilirubinemia, hypertriglyceridemia, impaired fasting glucose, MRSA, paroxysmal a-fib, sleep apnea, back surgery, R shoulder surgery.   Clinical Impression   PTA, pt lived with his wife and was independent until recently in which wife assisted with LBD and home making tasks. Upon eval, pt performing ADL at supervision with AE for LB ADL. Pt educated and demonstrating use of compensatory techniques for bed mobility, LB ADL, grooming, stairs, shower transfers, and toileting. Pt with decreased strength and dexterity in L hand/digits, but using functionally overall. Recommending OP OT to address LUE mobility, strength, and functional use. Pt reports he has already been in collaboration with MD regarding when OP OT will begin.      Recommendations for follow up therapy are one component of a multi-disciplinary discharge planning process, led by the attending physician.  Recommendations may be updated based on patient status, additional functional criteria and insurance authorization.   Follow Up Recommendations  Outpatient OT     Assistance Recommended at Discharge Intermittent Supervision/Assistance  Patient can return home with the following A little help with walking and/or transfers;A little help with bathing/dressing/bathroom;Assistance with cooking/housework;Assist for transportation;Help with stairs or ramp for entrance    Functional Status Assessment  Patient has had a recent decline in their functional status and demonstrates the ability to make significant improvements in function in a reasonable and predictable amount of time.  Equipment Recommendations  None recommended by OT    Recommendations for Other Services       Precautions / Restrictions  Precautions Precautions: Cervical;Other (comment) Precaution Booklet Issued: Yes (comment) Precaution Comments: All precautions reviewed within the context of ADL Required Braces or Orthoses:  (no brace needed orders; s/p carpal tunnel release) Restrictions Weight Bearing Restrictions: No      Mobility Bed Mobility Overal bed mobility: Modified Independent             General bed mobility comments: increaed time and compensatory techniques    Transfers Overall transfer level: Needs assistance Equipment used: Straight cane Transfers: Sit to/from Stand Sit to Stand: Supervision           General transfer comment: for safety      Balance Overall balance assessment: Mild deficits observed, not formally tested                                         ADL either performed or assessed with clinical judgement   ADL Overall ADL's : Needs assistance/impaired Eating/Feeding: Independent   Grooming: Supervision/safety;Standing Grooming Details (indicate cue type and reason): reviewed compensatory techniques Upper Body Bathing: Set up;Sitting   Lower Body Bathing: Supervison/ safety;Sit to/from stand   Upper Body Dressing : Set up;Sitting   Lower Body Dressing: Supervision/safety;Sit to/from stand;With adaptive equipment   Toilet Transfer: Supervision/safety;Ambulation (cane) Toilet Transfer Details (indicate cue type and reason): simulated in room Toileting- Clothing Manipulation and Hygiene: Modified independent;Sitting/lateral lean   Tub/ Shower Transfer: Walk-in shower;Supervision/safety;Ambulation Tub/Shower Transfer Details (indicate cue type and reason): reviewed compensatory techniques Functional mobility during ADLs: Supervision/safety;Cane General ADL Comments: overall fair awareness of safety. Mild balance deficits observed but good use of cane for support     Vision Baseline Vision/History: 1 Wears glasses Ability to  See in Adequate  Light: 0 Adequate Patient Visual Report: No change from baseline Vision Assessment?: No apparent visual deficits     Perception     Praxis      Pertinent Vitals/Pain Pain Assessment Pain Assessment: No/denies pain     Hand Dominance Right   Extremity/Trunk Assessment Upper Extremity Assessment Upper Extremity Assessment: Generalized weakness;LUE deficits/detail LUE Deficits / Details: decresed coordination and speed with finger opposition, but able to oppose all fingers. 4-/5 grip strength. Shoulder/elbow ROM WFL, and using functionally. Likely decr dexterity with oral care, feeding, etc. Pt reporting he still has numbness in 1st through 3rd digits, but improving as he can now feel finger tips LUE Sensation: decreased light touch LUE Coordination: decreased fine motor   Lower Extremity Assessment Lower Extremity Assessment: Generalized weakness   Cervical / Trunk Assessment Cervical / Trunk Assessment: Back Surgery;Neck Surgery   Communication Communication Communication: No difficulties   Cognition Arousal/Alertness: Awake/alert Behavior During Therapy: WFL for tasks assessed/performed Overall Cognitive Status: Within Functional Limits for tasks assessed                                 General Comments: Able to recall precautions     General Comments  VSS.    Exercises     Shoulder Instructions      Home Living Family/patient expects to be discharged to:: Private residence Living Arrangements: Spouse/significant other Available Help at Discharge: Family;Available PRN/intermittently Type of Home: House Home Access: Stairs to enter [employer] of Steps: 3-4 Entrance Stairs-Rails: Left;Right Home Layout: Two level;Bed/bath upstairs Alternate Level Stairs-Number of Steps: flight Alternate Level Stairs-Rails: Left;Right Bathroom Shower/Tub: Occupational psychologist: Standard     Home Equipment: Marine scientist - single point  (3 wheeled rollator)          Prior Functioning/Environment Prior Level of Function : Independent/Modified Independent;Driving             Mobility Comments: cane ADLs Comments: Recently, wife has been assisting with LB dressing and home management, but several weeks ago, pt independent.        OT Problem List: Decreased strength;Decreased activity tolerance;Impaired balance (sitting and/or standing);Impaired UE functional use;Impaired sensation;Decreased knowledge of use of DME or AE;Decreased knowledge of precautions;Decreased coordination      OT Treatment/Interventions: Self-care/ADL training;Therapeutic exercise;DME and/or AE instruction;Balance training;Patient/family education;Therapeutic activities    OT Goals(Current goals can be found in the care plan section) Acute Rehab OT Goals Patient Stated Goal: go home OT Goal Formulation: With patient Time For Goal Achievement: 07/17/22 Potential to Achieve Goals: Good  OT Frequency: Min 2X/week    Co-evaluation              AM-PAC OT "6 Clicks" Daily Activity     Outcome Measure Help from another person eating meals?: None Help from another person taking care of personal grooming?: A Little Help from another person toileting, which includes using toliet, bedpan, or urinal?: A Little Help from another person bathing (including washing, rinsing, drying)?: A Little Help from another person to put on and taking off regular upper body clothing?: A Little Help from another person to put on and taking off regular lower body clothing?: A Little 6 Click Score: 19   End of Session Equipment Utilized During Treatment: Gait belt (cane) Nurse Communication: Mobility status;Other (comment) (mild blood draining from neck incision)  Activity Tolerance: Patient tolerated treatment well Patient left: in  bed;with call bell/phone within reach;with nursing/sitter in room  OT Visit Diagnosis: Unsteadiness on feet (R26.81);Muscle  weakness (generalized) (M62.81);Other abnormalities of gait and mobility (R26.89)                Time: [id number] OT Time Calculation (min): 24 min Charges:  OT General Charges $OT Visit: 1 Visit OT Evaluation $OT Eval Low Complexity: 1 Low OT Treatments $Self Care/Home Management : 8-22 mins  Elder Cyphers, OTR/L [facility] Acute Rehabilitation Office: [phone]   Magnus Ivan 07/03/2022, 9:19 AM

## 2022-07-03 NOTE — Plan of Care (Signed)
  Problem: Education: Goal: Ability to verbalize activity precautions or restrictions will improve Outcome: Completed/Met Goal: Knowledge of the prescribed therapeutic regimen will improve Outcome: Completed/Met Goal: Understanding of discharge needs will improve Outcome: Completed/Met   Problem: Activity: Goal: Ability to avoid complications of mobility impairment will improve Outcome: Completed/Met Goal: Ability to tolerate increased activity will improve Outcome: Completed/Met Goal: Will remain free from falls Outcome: Completed/Met   Problem: Bowel/Gastric: Goal: Gastrointestinal status for postoperative course will improve Outcome: Completed/Met   Problem: Clinical Measurements: Goal: Ability to maintain clinical measurements within normal limits will improve Outcome: Completed/Met Goal: Postoperative complications will be avoided or minimized Outcome: Completed/Met Goal: Diagnostic test results will improve Outcome: Completed/Met   Problem: Pain Management: Goal: Pain level will decrease Outcome: Completed/Met   Problem: Skin Integrity: Goal: Will show signs of wound healing Outcome: Completed/Met   Problem: Health Behavior/Discharge Planning: Goal: Identification of resources available to assist in meeting health care needs will improve Outcome: Completed/Met   Problem: Bladder/Genitourinary: Goal: Urinary functional status for postoperative course will improve Outcome: Completed/Met Patient alert and oriented, void, VSS, ambulate.  D/c instructions explain and given all questions answered. Pt d/c home per order.

## 2022-08-26 DIAGNOSIS — M4802 Spinal stenosis, cervical region: Secondary | ICD-10-CM | POA: Diagnosis not present

## 2022-09-05 ENCOUNTER — Encounter: Payer: Self-pay | Admitting: Physical Therapy

## 2022-09-05 ENCOUNTER — Other Ambulatory Visit: Payer: Self-pay

## 2022-09-05 ENCOUNTER — Ambulatory Visit: Payer: PPO | Attending: Neurological Surgery | Admitting: Physical Therapy

## 2022-09-05 DIAGNOSIS — M6281 Muscle weakness (generalized): Secondary | ICD-10-CM | POA: Diagnosis not present

## 2022-09-05 DIAGNOSIS — M542 Cervicalgia: Secondary | ICD-10-CM

## 2022-09-05 DIAGNOSIS — R293 Abnormal posture: Secondary | ICD-10-CM | POA: Diagnosis not present

## 2022-09-05 DIAGNOSIS — R2689 Other abnormalities of gait and mobility: Secondary | ICD-10-CM | POA: Insufficient documentation

## 2022-09-05 NOTE — Therapy (Signed)
OUTPATIENT PHYSICAL THERAPY CERVICAL EVALUATION   Patient Name: Kevin Griffith MRN: [id number] DOB:04-12-1951, 72 y.o., male Today's Date: 09/05/2022  END OF SESSION:  PT End of Session - 09/05/22 0756     Visit Number 1    Number of Visits 16    Date for PT Re-Evaluation 10/31/22    Authorization Type [employer]    PT Start Time 0800    PT Stop Time 0845    PT Time Calculation (min) 45 min    Activity Tolerance Patient tolerated treatment well    Behavior During Therapy WFL for tasks assessed/performed             Past Medical History:  Diagnosis Date   Asymptomatic microscopic hematuria    Back pain, chronic    Cervical stenosis of spine    Colon polyps    DDD (degenerative disc disease), cervical    DDD (degenerative disc disease), lumbar    Drug rash    ED (erectile dysfunction)    Hyperbilirubinemia    Hypertriglyceridemia    IFG (impaired fasting glucose)    Insomnia disorder    Lactose intolerance    Mood change    MRSA (methicillin resistant Staphylococcus aureus)    Palpitations    Paroxysmal A-fib (HCC)    Skin problem    Sleep apnea    no cpap   Sleep apnea, obstructive    Vitamin D deficiency    Past Surgical History:  Procedure Laterality Date   ANTERIOR CERVICAL DECOMP/DISCECTOMY FUSION N/A 07/02/2022   Procedure: Anterior Cervical Decompression Fusion  - Cervical three-Cervical four - Cervical four-Cervical five - Cervical five-Cervical six;  Surgeon: Tia Alert, MD;  Location: [facility] OR;  Service: Neurosurgery;  Laterality: N/A;   APPENDECTOMY  1970   BACK SURGERY  1998, 07-2001   CARPAL TUNNEL RELEASE Left 07/02/2022   Procedure: Left carpal tunnel release;  Surgeon: Tia Alert, MD;  Location: [facility] OR;  Service: Neurosurgery;  Laterality: Left;   HEMIDISCECECTOMY     SHOULDER SURGERY Right    Patient Active Problem List   Diagnosis Date Noted   S/P cervical spinal fusion 07/02/2022    PCP: Rodrigo Ran, MD  REFERRING  PROVIDER: Tia Alert, MD  REFERRING DIAG:  [phone] (ICD-10-CM) - Spinal stenosis, cervical region    THERAPY DIAG:  Cervicalgia  Rationale for Evaluation and Treatment: Rehabilitation  ONSET DATE: 07/02/22 ACDF C3-4, 4-5, 5-6 with left carpal tunnel release  SUBJECTIVE:  SUBJECTIVE STATEMENT: Pt reports he had woken up on Thanksgiving and couldn't move his L arm. Saw PCP and eventually saw neurosurgeon. Pt reports his nerves were crushed in cervical discs and carpal tunnel. Pt states neck and back pain started going away end of March. Pt states he has been mowing the yard. Works out regularly at [employer]. Has been cleared to return to exercise. Has been doing 10# curls. Does note L shoulder atrophy. States he just started being able to type with his L hand. Pt states he is currently wearing a wrist brace. L phalanges 1-3 remain numb -- was told it may be numb for up to 6 months.  Hand dominance: Right  PERTINENT HISTORY:  L4-L5 fusion 2020, R RTC repair  PAIN:  Are you having pain? Yes: NPRS scale: 0 in neck, 2 or 3 at worst/10 Pain location: L mid deltoid Pain description: dull aching pain Aggravating factors: pushing Relieving factors: rest/careful movements  PRECAUTIONS: Other: pt reports Dr. Yetta Barre has not mentioned any precautions  WEIGHT BEARING RESTRICTIONS: No  FALLS:  Has patient fallen in last 6 months? No  LIVING ENVIRONMENT: Lives with: lives with their spouse Lives in: House/apartment Has following equipment at home: Single point cane  OCCUPATION: Retired  PLOF: Independent  PATIENT GOALS: Strengthen L shoulder, get up/down from floor without pulling up on something, reach down to tie his shoes, improve flexibility  NEXT MD VISIT: n/a  OBJECTIVE:    DIAGNOSTIC FINDINGS:  Cervical x-ray 07/02/22 IMPRESSION: Intraoperative images during C3-C6 ACDF. Hardware is intact. No evidence of immediate complication.  PATIENT SURVEYS:  FOTO 46; predicted 34  COGNITION: Overall cognitive status: Within functional limits for tasks assessed  SENSATION: L phalanges 1-3 N/T  POSTURE: rounded shoulders and forward head, increased thoracic kyphosis  PALPATION: L thoracic paraspinal tenderness   CERVICAL ROM:   Active ROM A/PROM (deg) eval  Flexion 28  Extension 20  Right lateral flexion 20  Left lateral flexion 20  Right rotation 35  Left rotation 30   (Blank rows = not tested)  UPPER EXTREMITY ROM:  Active ROM Right eval Left eval  Shoulder flexion 118 105  Shoulder extension 60 60  Shoulder abduction 142 110*  Shoulder adduction    Shoulder internal rotation L4 L5*  Shoulder external rotation T3 C7*  Elbow flexion    Elbow extension    Wrist flexion    Wrist extension    Wrist ulnar deviation    Wrist radial deviation    Wrist pronation    Wrist supination     (Blank rows = not tested)  UPPER EXTREMITY MMT:  MMT Right eval Left eval  Shoulder flexion 5 4  Shoulder extension 5 5  Shoulder abduction 5 4  Shoulder adduction    Shoulder internal rotation 5 5  Shoulder external rotation 4 4  Middle trapezius  3  Lower trapezius  3-  Elbow flexion    Elbow extension    Wrist flexion 4 4  Wrist extension 4 4  Wrist ulnar deviation    Wrist radial deviation    Wrist pronation 5 5  Wrist supination 5 4  Grip strength 60, 57, 60 35, 30, 35   (Blank rows = not tested)  CERVICAL SPECIAL TESTS:  Neck flexor muscle endurance test: Negative  FUNCTIONAL TESTS:  Did not assess  TODAY'S TREATMENT:  DATE: 09/05/22   PATIENT EDUCATION:  Education details: Exam findings, POC, initial  HEP Person educated: Patient Education method: Explanation, Demonstration, and Handouts Education comprehension: verbalized understanding, returned demonstration, and needs further education  HOME EXERCISE PROGRAM: Access Code: [email or web address] URL: [email or web address] Date: 09/05/2022 Prepared by: Vernon Prey April Kirstie Peri  Exercises - Seated Cervical Retraction  - 1 x daily - 7 x weekly - 2 sets - 10 reps - Shoulder External Rotation and Scapular Retraction  - 1 x daily - 7 x weekly - 2 sets - 10 reps - Shoulder External Rotation in 45 Degrees Abduction  - 1 x daily - 7 x weekly - 2 sets - 10 reps  ASSESSMENT:  CLINICAL IMPRESSION: Patient is a 72 y.o. M who was seen today for physical therapy evaluation and treatment s/p ACDF C3-4, 4-5, 5-6 with L carpal tunnel release on 07/02/22. Pt has PMH for prior R shoulder repair and lumbar fusion with L hip pain. Assessment significant for midback/posterior shoulder girdle weakness, decreased L UE strength and AROM, N/T in L hand, decreased cervical ROM, and postural abnormalities. Pt's greatest deficits that he would like to address are with reaching and grasping tasks as well as floor to stand transfers. Pt would benefit from PT to work on these issues to maximize his level of function.   OBJECTIVE IMPAIRMENTS: Abnormal gait, decreased activity tolerance, decreased balance, decreased endurance, decreased mobility, difficulty walking, decreased ROM, decreased strength, hypomobility, increased fascial restrictions, impaired flexibility, impaired sensation, impaired UE functional use, improper body mechanics, postural dysfunction, and pain.   ACTIVITY LIMITATIONS: carrying, lifting, bending, transfers, bathing, dressing, reach over head, hygiene/grooming, and locomotion level  PARTICIPATION LIMITATIONS: cleaning, shopping, community activity, and yard work  PERSONAL FACTORS: Age, Fitness, Past/current experiences, and Time since onset of  injury/illness/exacerbation are also affecting patient's functional outcome.   REHAB POTENTIAL: Good  CLINICAL DECISION MAKING: Evolving/moderate complexity  EVALUATION COMPLEXITY: Moderate   GOALS: Goals reviewed with patient? Yes  SHORT TERM GOALS: Target date: 10/03/2022   Pt will be ind with initial HEP Baseline:  Goal status: INITIAL  2.  Pt will have improved L shoulder abd to at least 120 deg for overhead tasks Baseline:  Goal status: INITIAL  LONG TERM GOALS: Target date: 10/31/2022   Pt will be ind with management and progression of HEP Baseline:  Goal status: INITIAL  2.  Pt will have improved FOTO to >/=58 Baseline: 46 Goal status: INITIAL  3.  Pt will have improved grip strength to >/=50 lbs Baseline:  Goal status: INITIAL  4.  Pt will have improved L = R shoulder AROM without pain for all reaching tasks Baseline:  Goal status: INITIAL  5.  Pt will be able to demonstrate floor to stand transfer ind per pt's personal goals for yard work Baseline:  Goal status: INITIAL    PLAN:  PT FREQUENCY: 2x/week  PT DURATION: 8 weeks  PLANNED INTERVENTIONS: Therapeutic exercises, Therapeutic activity, Neuromuscular re-education, Balance training, Gait training, Patient/Family education, Self Care, Joint mobilization, Stair training, Aquatic Therapy, Dry Needling, Electrical stimulation, Cryotherapy, Moist heat, Taping, Vasopneumatic device, Ionotophoresis 4mg /ml Dexamethasone, Manual therapy, and Re-evaluation  PLAN FOR NEXT SESSION: Assess response to HEP. Work on Occupational psychologist. Initiate shoulder AAROM and core work.    Thomson Herbers April Ma L Thanos [place], PT 09/05/2022, 9:09 AM

## 2022-09-08 ENCOUNTER — Ambulatory Visit: Payer: PPO

## 2022-09-08 ENCOUNTER — Ambulatory Visit: Payer: PPO | Admitting: Physical Therapy

## 2022-09-08 DIAGNOSIS — R2689 Other abnormalities of gait and mobility: Secondary | ICD-10-CM

## 2022-09-08 DIAGNOSIS — M542 Cervicalgia: Secondary | ICD-10-CM | POA: Diagnosis not present

## 2022-09-08 DIAGNOSIS — R293 Abnormal posture: Secondary | ICD-10-CM

## 2022-09-08 DIAGNOSIS — M6281 Muscle weakness (generalized): Secondary | ICD-10-CM

## 2022-09-08 NOTE — Therapy (Signed)
OUTPATIENT PHYSICAL THERAPY CERVICAL EVALUATION   Patient Name: Kevin Griffith MRN: [id number] DOB:1950/07/12, 72 y.o., male Today's Date: 09/08/2022  END OF SESSION:  PT End of Session - 09/08/22 1455     Visit Number 2    Number of Visits 16    Date for PT Re-Evaluation 10/31/22    Authorization Type [employer]    PT Start Time 1455    PT Stop Time 1530    PT Time Calculation (min) 35 min    Activity Tolerance Patient tolerated treatment well    Behavior During Therapy WFL for tasks assessed/performed             Past Medical History:  Diagnosis Date   Asymptomatic microscopic hematuria    Back pain, chronic    Cervical stenosis of spine    Colon polyps    DDD (degenerative disc disease), cervical    DDD (degenerative disc disease), lumbar    Drug rash    ED (erectile dysfunction)    Hyperbilirubinemia    Hypertriglyceridemia    IFG (impaired fasting glucose)    Insomnia disorder    Lactose intolerance    Mood change    MRSA (methicillin resistant Staphylococcus aureus)    Palpitations    Paroxysmal A-fib (HCC)    Skin problem    Sleep apnea    no cpap   Sleep apnea, obstructive    Vitamin D deficiency    Past Surgical History:  Procedure Laterality Date   ANTERIOR CERVICAL DECOMP/DISCECTOMY FUSION N/A 07/02/2022   Procedure: Anterior Cervical Decompression Fusion  - Cervical three-Cervical four - Cervical four-Cervical five - Cervical five-Cervical six;  Surgeon: Tia Alert, MD;  Location: [facility] OR;  Service: Neurosurgery;  Laterality: N/A;   APPENDECTOMY  1970   BACK SURGERY  1998, 07-2001   CARPAL TUNNEL RELEASE Left 07/02/2022   Procedure: Left carpal tunnel release;  Surgeon: Tia Alert, MD;  Location: [facility] OR;  Service: Neurosurgery;  Laterality: Left;   HEMIDISCECECTOMY     SHOULDER SURGERY Right    Patient Active Problem List   Diagnosis Date Noted   S/P cervical spinal fusion 07/02/2022    PCP: Rodrigo Ran, MD  REFERRING  PROVIDER: Tia Alert, MD  REFERRING DIAG:  [phone] (ICD-10-CM) - Spinal stenosis, cervical region    THERAPY DIAG:  Cervicalgia  Abnormal posture  Muscle weakness (generalized)  Other abnormalities of gait and mobility  Rationale for Evaluation and Treatment: Rehabilitation  ONSET DATE: 07/02/22 ACDF C3-4, 4-5, 5-6 with left carpal tunnel release  SUBJECTIVE:  SUBJECTIVE STATEMENT: Pt states he had a nervous system response after doing the exercises. Reports he got cold and slept a lot. Was able to do his exercises without any issues.   From eval: Pt reports he had woken up on Thanksgiving and couldn't move his L arm. Saw PCP and eventually saw neurosurgeon. Pt reports his nerves were crushed in cervical discs and carpal tunnel. Pt states neck and back pain started going away end of March. Pt states he has been mowing the yard. Works out regularly at [employer]. Has been cleared to return to exercise. Has been doing 10# curls. Does note L shoulder atrophy. States he just started being able to type with his L hand. Pt states he is currently wearing a wrist brace. L phalanges 1-3 remain numb -- was told it may be numb for up to 6 months.  Hand dominance: Right  PERTINENT HISTORY:  L4-L5 fusion 2020, R RTC repair  PAIN:  Are you having pain? Yes: NPRS scale: 0 in neck, 2 or 3 at worst/10 Pain location: L mid deltoid Pain description: dull aching pain Aggravating factors: pushing Relieving factors: rest/careful movements  PRECAUTIONS: Other: pt reports Dr. Yetta Barre has not mentioned any precautions  WEIGHT BEARING RESTRICTIONS: No  FALLS:  Has patient fallen in last 6 months? No  LIVING ENVIRONMENT: Lives with: lives with their spouse Lives in: House/apartment Has following  equipment at home: Single point cane  OCCUPATION: Retired  PLOF: Independent  PATIENT GOALS: Strengthen L shoulder, get up/down from floor without pulling up on something, reach down to tie his shoes, improve flexibility  NEXT MD VISIT: n/a  OBJECTIVE:   DIAGNOSTIC FINDINGS:  Cervical x-ray 07/02/22 IMPRESSION: Intraoperative images during C3-C6 ACDF. Hardware is intact. No evidence of immediate complication.  PATIENT SURVEYS:  FOTO 46; predicted 64  COGNITION: Overall cognitive status: Within functional limits for tasks assessed  SENSATION: L phalanges 1-3 N/T  POSTURE: rounded shoulders and forward head, increased thoracic kyphosis  PALPATION: L thoracic paraspinal tenderness   CERVICAL ROM:   Active ROM A/PROM (deg) eval  Flexion 28  Extension 20  Right lateral flexion 20  Left lateral flexion 20  Right rotation 35  Left rotation 30   (Blank rows = not tested)  UPPER EXTREMITY ROM:  Active ROM Right eval Left eval  Shoulder flexion 118 105  Shoulder extension 60 60  Shoulder abduction 142 110*  Shoulder adduction    Shoulder internal rotation L4 L5*  Shoulder external rotation T3 C7*  Elbow flexion    Elbow extension    Wrist flexion    Wrist extension    Wrist ulnar deviation    Wrist radial deviation    Wrist pronation    Wrist supination     (Blank rows = not tested)  UPPER EXTREMITY MMT:  MMT Right eval Left eval  Shoulder flexion 5 4  Shoulder extension 5 5  Shoulder abduction 5 4  Shoulder adduction    Shoulder internal rotation 5 5  Shoulder external rotation 4 4  Middle trapezius  3  Lower trapezius  3-  Elbow flexion    Elbow extension    Wrist flexion 4 4  Wrist extension 4 4  Wrist ulnar deviation    Wrist radial deviation    Wrist pronation 5 5  Wrist supination 5 4  Grip strength 60, 57, 60 35, 30, 35   (Blank rows = not tested)  CERVICAL SPECIAL TESTS:  Neck flexor muscle endurance test: Negative  FUNCTIONAL  TESTS:  Did not assess  TODAY'S TREATMENT:                                                                                                                              DATE:  09/08/22 Seated cervical retraction 2x10 with scap retraction Shoulder ER + scap retraction 2x10, with yellow TB x10 W 2x10, with yellow TB x10 Shoulder horizontal abd yellow TB 2x10 Seated pendulums x10 Supine shoulder flexion AAROM with cane 5x10 sec hold cues  Supine shoulder abd AAROM with cane x10 Supine shoulder ER at 90 deg abd 2x10 Supine hand squeezes 2x10 Supine wrist flex/ext AROM 2x10 Standing row red TB 2x10 Standing shoulder ext red TB 2x10  PATIENT EDUCATION:  Education details: Exam findings, POC, initial HEP Person educated: Patient Education method: Explanation, Demonstration, and Handouts Education comprehension: verbalized understanding, returned demonstration, and needs further education  HOME EXERCISE PROGRAM: Access Code: [email or web address] URL: [email or web address] Date: 09/05/2022 Prepared by: Vernon Prey April Kirstie Peri  Exercises - Seated Cervical Retraction  - 1 x daily - 7 x weekly - 2 sets - 10 reps - Shoulder External Rotation and Scapular Retraction  - 1 x daily - 7 x weekly - 2 sets - 10 reps - Shoulder External Rotation in 45 Degrees Abduction  - 1 x daily - 7 x weekly - 2 sets - 10 reps  ASSESSMENT:  CLINICAL IMPRESSION: Session focused on gross shoulder AAROM, posterior shoulder girdle strengthening and RTC strengthening. Pt tolerated well without any issues. Pec tightness noted. Discussed maintaining scapular positioning with strengthening.   From eval: Patient is a 72 y.o. M who was seen today for physical therapy evaluation and treatment s/p ACDF C3-4, 4-5, 5-6 with L carpal tunnel release on 07/02/22. Pt has PMH for prior R shoulder repair and lumbar fusion with L hip pain. Assessment significant for midback/posterior shoulder girdle weakness, decreased L UE  strength and AROM, N/T in L hand, decreased cervical ROM, and postural abnormalities. Pt's greatest deficits that he would like to address are with reaching and grasping tasks as well as floor to stand transfers. Pt would benefit from PT to work on these issues to maximize his level of function.   OBJECTIVE IMPAIRMENTS: Abnormal gait, decreased activity tolerance, decreased balance, decreased endurance, decreased mobility, difficulty walking, decreased ROM, decreased strength, hypomobility, increased fascial restrictions, impaired flexibility, impaired sensation, impaired UE functional use, improper body mechanics, postural dysfunction, and pain.   ACTIVITY LIMITATIONS: carrying, lifting, bending, transfers, bathing, dressing, reach over head, hygiene/grooming, and locomotion level  PARTICIPATION LIMITATIONS: cleaning, shopping, community activity, and yard work  PERSONAL FACTORS: Age, Fitness, Past/current experiences, and Time since onset of injury/illness/exacerbation are also affecting patient's functional outcome.   REHAB POTENTIAL: Good  CLINICAL DECISION MAKING: Evolving/moderate complexity  EVALUATION COMPLEXITY: Moderate   GOALS: Goals reviewed with patient? Yes  SHORT TERM GOALS: Target date: 10/03/2022   Pt will be ind with initial HEP Baseline:  Goal status:  INITIAL  2.  Pt will have improved L shoulder abd to at least 120 deg for overhead tasks Baseline:  Goal status: INITIAL  LONG TERM GOALS: Target date: 10/31/2022   Pt will be ind with management and progression of HEP Baseline:  Goal status: INITIAL  2.  Pt will have improved FOTO to >/=58 Baseline: 46 Goal status: INITIAL  3.  Pt will have improved grip strength to >/=50 lbs Baseline:  Goal status: INITIAL  4.  Pt will have improved L = R shoulder AROM without pain for all reaching tasks Baseline:  Goal status: INITIAL  5.  Pt will be able to demonstrate floor to stand transfer ind per pt's personal  goals for yard work Baseline:  Goal status: INITIAL    PLAN:  PT FREQUENCY: 2x/week  PT DURATION: 8 weeks  PLANNED INTERVENTIONS: Therapeutic exercises, Therapeutic activity, Neuromuscular re-education, Balance training, Gait training, Patient/Family education, Self Care, Joint mobilization, Stair training, Aquatic Therapy, Dry Needling, Electrical stimulation, Cryotherapy, Moist heat, Taping, Vasopneumatic device, Ionotophoresis 4mg /ml Dexamethasone, Manual therapy, and Re-evaluation  PLAN FOR NEXT SESSION: Assess response to HEP. Work on Occupational psychologist. Initiate shoulder AAROM and core work.    Kassity Woodson April Ma L Shaili Donalson, PT 09/08/2022, 2:55 PM

## 2022-09-10 DIAGNOSIS — H2513 Age-related nuclear cataract, bilateral: Secondary | ICD-10-CM | POA: Diagnosis not present

## 2022-09-10 DIAGNOSIS — H1789 Other corneal scars and opacities: Secondary | ICD-10-CM | POA: Diagnosis not present

## 2022-09-10 DIAGNOSIS — H43813 Vitreous degeneration, bilateral: Secondary | ICD-10-CM | POA: Diagnosis not present

## 2022-09-10 DIAGNOSIS — H52203 Unspecified astigmatism, bilateral: Secondary | ICD-10-CM | POA: Diagnosis not present

## 2022-09-10 DIAGNOSIS — H5203 Hypermetropia, bilateral: Secondary | ICD-10-CM | POA: Diagnosis not present

## 2022-09-15 NOTE — Therapy (Signed)
Pt canceled this visit and was seen by another PT the same day. Bethann Punches, [place] 09/15/22 8:56 AM

## 2022-09-16 ENCOUNTER — Ambulatory Visit: Payer: PPO

## 2022-09-16 DIAGNOSIS — M542 Cervicalgia: Secondary | ICD-10-CM

## 2022-09-16 DIAGNOSIS — R293 Abnormal posture: Secondary | ICD-10-CM

## 2022-09-16 DIAGNOSIS — M6281 Muscle weakness (generalized): Secondary | ICD-10-CM

## 2022-09-16 NOTE — Therapy (Signed)
OUTPATIENT PHYSICAL THERAPY CERVICAL EVALUATION   Patient Name: Kevin Griffith MRN: [id number] DOB:06-25-1950, 72 y.o., male Today's Date: 09/16/2022  END OF SESSION:  PT End of Session - 09/16/22 1102     Visit Number 3    Date for PT Re-Evaluation 10/31/22    Authorization Type [employer]    PT Start Time 1017    PT Stop Time 1101    PT Time Calculation (min) 44 min    Activity Tolerance Patient tolerated treatment well    Behavior During Therapy WFL for tasks assessed/performed              Past Medical History:  Diagnosis Date   Asymptomatic microscopic hematuria    Back pain, chronic    Cervical stenosis of spine    Colon polyps    DDD (degenerative disc disease), cervical    DDD (degenerative disc disease), lumbar    Drug rash    ED (erectile dysfunction)    Hyperbilirubinemia    Hypertriglyceridemia    IFG (impaired fasting glucose)    Insomnia disorder    Lactose intolerance    Mood change    MRSA (methicillin resistant Staphylococcus aureus)    Palpitations    Paroxysmal A-fib (HCC)    Skin problem    Sleep apnea    no cpap   Sleep apnea, obstructive    Vitamin D deficiency    Past Surgical History:  Procedure Laterality Date   ANTERIOR CERVICAL DECOMP/DISCECTOMY FUSION N/A 07/02/2022   Procedure: Anterior Cervical Decompression Fusion  - Cervical three-Cervical four - Cervical four-Cervical five - Cervical five-Cervical six;  Surgeon: Tia Alert, MD;  Location: [facility] OR;  Service: Neurosurgery;  Laterality: N/A;   APPENDECTOMY  1970   BACK SURGERY  1998, 07-2001   CARPAL TUNNEL RELEASE Left 07/02/2022   Procedure: Left carpal tunnel release;  Surgeon: Tia Alert, MD;  Location: [facility] OR;  Service: Neurosurgery;  Laterality: Left;   HEMIDISCECECTOMY     SHOULDER SURGERY Right    Patient Active Problem List   Diagnosis Date Noted   S/P cervical spinal fusion 07/02/2022    PCP: Rodrigo Ran, MD  REFERRING PROVIDER: Tia Alert,  MD  REFERRING DIAG:  [phone] (ICD-10-CM) - Spinal stenosis, cervical region    THERAPY DIAG:  Cervicalgia  Abnormal posture  Muscle weakness (generalized)  Rationale for Evaluation and Treatment: Rehabilitation  ONSET DATE: 07/02/22 ACDF C3-4, 4-5, 5-6 with left carpal tunnel release  SUBJECTIVE:  SUBJECTIVE STATEMENT: It took me a while to recover after last session.  I think my protein levels were off so I added protein supplements.    From eval: Pt reports he had woken up on Thanksgiving and couldn't move his L arm. Saw PCP and eventually saw neurosurgeon. Pt reports his nerves were crushed in cervical discs and carpal tunnel. Pt states neck and back pain started going away end of March. Pt states he has been mowing the yard. Works out regularly at [employer]. Has been cleared to return to exercise. Has been doing 10# curls. Does note L shoulder atrophy. States he just started being able to type with his L hand. Pt states he is currently wearing a wrist brace. L phalanges 1-3 remain numb -- was told it may be numb for up to 6 months.  Hand dominance: Right  PERTINENT HISTORY:  L4-L5 fusion 2020, R RTC repair  PAIN:  Are you having pain? Yes: NPRS scale: 0 in neck, 2 or 3 at worst/10 Pain location: L mid deltoid Pain description: dull aching pain Aggravating factors: pushing Relieving factors: rest/careful movements  PRECAUTIONS: Other: pt reports Dr. Yetta Barre has not mentioned any precautions  WEIGHT BEARING RESTRICTIONS: No  FALLS:  Has patient fallen in last 6 months? No  LIVING ENVIRONMENT: Lives with: lives with their spouse Lives in: House/apartment Has following equipment at home: Single point cane  OCCUPATION: Retired  PLOF: Independent  PATIENT GOALS: Strengthen  L shoulder, get up/down from floor without pulling up on something, reach down to tie his shoes, improve flexibility  NEXT MD VISIT: 11/25/22  OBJECTIVE:   DIAGNOSTIC FINDINGS:  Cervical x-ray 07/02/22 IMPRESSION: Intraoperative images during C3-C6 ACDF. Hardware is intact. No evidence of immediate complication.  PATIENT SURVEYS:  FOTO 46; predicted 79  COGNITION: Overall cognitive status: Within functional limits for tasks assessed  SENSATION: L phalanges 1-3 N/T  POSTURE: rounded shoulders and forward head, increased thoracic kyphosis  PALPATION: L thoracic paraspinal tenderness   CERVICAL ROM:   Active ROM A/PROM (deg) eval  Flexion 28  Extension 20  Right lateral flexion 20  Left lateral flexion 20  Right rotation 35  Left rotation 30   (Blank rows = not tested)  UPPER EXTREMITY ROM:  Active ROM Right eval Left eval  Shoulder flexion 118 105  Shoulder extension 60 60  Shoulder abduction 142 110*  Shoulder adduction    Shoulder internal rotation L4 L5*  Shoulder external rotation T3 C7*  Elbow flexion    Elbow extension    Wrist flexion    Wrist extension    Wrist ulnar deviation    Wrist radial deviation    Wrist pronation    Wrist supination     (Blank rows = not tested)  UPPER EXTREMITY MMT:  MMT Right eval Left eval  Shoulder flexion 5 4  Shoulder extension 5 5  Shoulder abduction 5 4  Shoulder adduction    Shoulder internal rotation 5 5  Shoulder external rotation 4 4  Middle trapezius  3  Lower trapezius  3-  Elbow flexion    Elbow extension    Wrist flexion 4 4  Wrist extension 4 4  Wrist ulnar deviation    Wrist radial deviation    Wrist pronation 5 5  Wrist supination 5 4  Grip strength 60, 57, 60 35, 30, 35   (Blank rows = not tested)  CERVICAL SPECIAL TESTS:  Neck flexor muscle endurance test: Negative  FUNCTIONAL TESTS:  Did  not assess  TODAY'S TREATMENT:      DATE:  09/16/22 Arm bike: Level 1x 5 minutes  (2.5/2.5)-PT present to discuss progress Shoulder ER + scap retraction 2x10, with yellow TB x10- seated on pad and PT cued core activation  Shoulder horizontal abd supine red TB 2x10.  TA activation Serratus punches 3# added 2x15 Supine shoulder flexion AAROM with cane 5x10 sec hold cues  Seated 3 way raises: 1# flexion, no weight for scaption and abduction on Lt x10 each Digiflex: red mass grip: 3 minutes  Bent row on Lt 4# x20                                                                                                              DATE:  09/08/22 Seated cervical retraction 2x10 with scap retraction Shoulder ER + scap retraction 2x10, with yellow TB x10 W 2x10, with yellow TB x10 Shoulder horizontal abd yellow TB 2x10 Seated pendulums x10 Supine shoulder flexion AAROM with cane 5x10 sec hold cues  Supine shoulder abd AAROM with cane x10 Supine shoulder ER at 90 deg abd 2x10 Supine hand squeezes 2x10 Supine wrist flex/ext AROM 2x10 Standing row red TB 2x10 Standing shoulder ext red TB 2x10  PATIENT EDUCATION:  Education details: Exam findings, POC, initial HEP Person educated: Patient Education method: Explanation, Demonstration, and Handouts Education comprehension: verbalized understanding, returned demonstration, and needs further education  HOME EXERCISE PROGRAM: Access Code: [email or web address] URL: [email or web address] Date: 09/16/2022 Prepared by: Tresa Endo  Exercises - Seated Cervical Retraction  - 1 x daily - 7 x weekly - 2 sets - 10 reps - Shoulder External Rotation and Scapular Retraction  - 1 x daily - 7 x weekly - 2 sets - 10 reps - Shoulder External Rotation in 45 Degrees Abduction  - 1 x daily - 7 x weekly - 2 sets - 10 reps - Standing Bilateral Low Shoulder Row with Anchored Resistance  - 1 x daily - 7 x weekly - 2 sets - 10 reps - Shoulder extension with resistance - Neutral  - 1 x daily - 7 x weekly - 2 sets - 10 reps - Supine Shoulder Flexion with Dowel   - 1 x daily - 7 x weekly - 1 sets - 5 reps - 10 sec hold - Supine Shoulder Abduction AAROM with Dowel  - 1 x daily - 7 x weekly - 1 sets - 10 reps - 2-3 sec hold ASSESSMENT:  CLINICAL IMPRESSION: Pt reports some LE weakness over the past week and thinks this is related to working the upper body and creating protein imbalance.  He had increased his protein supplements and this has helped.  PT added cueing for core activation to incorporate with scapular and shoulder exercises.  Pt tolerated well without any issues. PT provided verbal cues for scapular position with exercise.  Patient will benefit from skilled PT to address the below impairments and improve overall function.    OBJECTIVE IMPAIRMENTS: Abnormal gait, decreased activity tolerance, decreased balance, decreased endurance, decreased mobility, difficulty walking, decreased  ROM, decreased strength, hypomobility, increased fascial restrictions, impaired flexibility, impaired sensation, impaired UE functional use, improper body mechanics, postural dysfunction, and pain.   ACTIVITY LIMITATIONS: carrying, lifting, bending, transfers, bathing, dressing, reach over head, hygiene/grooming, and locomotion level  PARTICIPATION LIMITATIONS: cleaning, shopping, community activity, and yard work  PERSONAL FACTORS: Age, Fitness, Past/current experiences, and Time since onset of injury/illness/exacerbation are also affecting patient's functional outcome.   REHAB POTENTIAL: Good  CLINICAL DECISION MAKING: Evolving/moderate complexity  EVALUATION COMPLEXITY: Moderate   GOALS: Goals reviewed with patient? Yes  SHORT TERM GOALS: Target date: 10/03/2022   Pt will be ind with initial HEP Baseline:  Goal status: IN PROGRESS  2.  Pt will have improved L shoulder abd to at least 120 deg for overhead tasks Baseline:  Goal status: INITIAL  LONG TERM GOALS: Target date: 10/31/2022   Pt will be ind with management and progression of HEP Baseline:   Goal status: INITIAL  2.  Pt will have improved FOTO to >/=58 Baseline: 46 Goal status: INITIAL  3.  Pt will have improved grip strength to >/=50 lbs Baseline:  Goal status: INITIAL  4.  Pt will have improved L = R shoulder AROM without pain for all reaching tasks Baseline:  Goal status: INITIAL  5.  Pt will be able to demonstrate floor to stand transfer ind per pt's personal goals for yard work Baseline:  Goal status: INITIAL    PLAN:  PT FREQUENCY: 2x/week  PT DURATION: 8 weeks  PLANNED INTERVENTIONS: Therapeutic exercises, Therapeutic activity, Neuromuscular re-education, Balance training, Gait training, Patient/Family education, Self Care, Joint mobilization, Stair training, Aquatic Therapy, Dry Needling, Electrical stimulation, Cryotherapy, Moist heat, Taping, Vasopneumatic device, Ionotophoresis 4mg /ml Dexamethasone, Manual therapy, and Re-evaluation  PLAN FOR NEXT SESSION: Add to HEP as needed, work on UE strength, postural strength and incorporate core   [employer], PT 09/16/22 11:03 AM

## 2022-09-19 ENCOUNTER — Ambulatory Visit: Payer: PPO | Admitting: Physical Therapy

## 2022-09-19 DIAGNOSIS — R293 Abnormal posture: Secondary | ICD-10-CM

## 2022-09-19 DIAGNOSIS — M6281 Muscle weakness (generalized): Secondary | ICD-10-CM

## 2022-09-19 DIAGNOSIS — M542 Cervicalgia: Secondary | ICD-10-CM

## 2022-09-19 DIAGNOSIS — R2689 Other abnormalities of gait and mobility: Secondary | ICD-10-CM

## 2022-09-19 NOTE — Therapy (Signed)
OUTPATIENT PHYSICAL THERAPY CERVICAL TREATMENT   Patient Name: Kevin Griffith MRN: [id number] DOB:Oct 09, 1950, 72 y.o., male Today's Date: 09/19/2022  END OF SESSION:  PT End of Session - 09/19/22 0758     Visit Number 4    Date for PT Re-Evaluation 10/31/22    Authorization Type [employer]    PT Start Time 0800    PT Stop Time 0840    PT Time Calculation (min) 40 min    Activity Tolerance Patient tolerated treatment well    Behavior During Therapy WFL for tasks assessed/performed              Past Medical History:  Diagnosis Date   Asymptomatic microscopic hematuria    Back pain, chronic    Cervical stenosis of spine    Colon polyps    DDD (degenerative disc disease), cervical    DDD (degenerative disc disease), lumbar    Drug rash    ED (erectile dysfunction)    Hyperbilirubinemia    Hypertriglyceridemia    IFG (impaired fasting glucose)    Insomnia disorder    Lactose intolerance    Mood change    MRSA (methicillin resistant Staphylococcus aureus)    Palpitations    Paroxysmal A-fib (HCC)    Skin problem    Sleep apnea    no cpap   Sleep apnea, obstructive    Vitamin D deficiency    Past Surgical History:  Procedure Laterality Date   ANTERIOR CERVICAL DECOMP/DISCECTOMY FUSION N/A 07/02/2022   Procedure: Anterior Cervical Decompression Fusion  - Cervical three-Cervical four - Cervical four-Cervical five - Cervical five-Cervical six;  Surgeon: Tia Alert, MD;  Location: [facility] OR;  Service: Neurosurgery;  Laterality: N/A;   APPENDECTOMY  1970   BACK SURGERY  1998, 07-2001   CARPAL TUNNEL RELEASE Left 07/02/2022   Procedure: Left carpal tunnel release;  Surgeon: Tia Alert, MD;  Location: [facility] OR;  Service: Neurosurgery;  Laterality: Left;   HEMIDISCECECTOMY     SHOULDER SURGERY Right    Patient Active Problem List   Diagnosis Date Noted   S/P cervical spinal fusion 07/02/2022    PCP: Rodrigo Ran, MD  REFERRING PROVIDER: Tia Alert,  MD  REFERRING DIAG:  [phone] (ICD-10-CM) - Spinal stenosis, cervical region    THERAPY DIAG:  Cervicalgia  Abnormal posture  Muscle weakness (generalized)  Other abnormalities of gait and mobility  Rationale for Evaluation and Treatment: Rehabilitation  ONSET DATE: 07/02/22 ACDF C3-4, 4-5, 5-6 with left carpal tunnel release  SUBJECTIVE:  SUBJECTIVE STATEMENT: "My L5 facet is locked up. It started Wednesday night. I didn't go to the gym yesterday. I am headed to the gym today to do leg exercises and ab glider." Pt states the erector muscle started spasming on the right side of his neck after last session.   From eval: Pt reports he had woken up on Thanksgiving and couldn't move his L arm. Saw PCP and eventually saw neurosurgeon. Pt reports his nerves were crushed in cervical discs and carpal tunnel. Pt states neck and back pain started going away end of March. Pt states he has been mowing the yard. Works out regularly at [employer]. Has been cleared to return to exercise. Has been doing 10# curls. Does note L shoulder atrophy. States he just started being able to type with his L hand. Pt states he is currently wearing a wrist brace. L phalanges 1-3 remain numb -- was told it may be numb for up to 6 months.  Hand dominance: Right  PERTINENT HISTORY:  L4-L5 fusion 2020, R RTC repair  PAIN:  Are you having pain? Yes: NPRS scale: 0 in neck, 2 or 3 at worst/10 Pain location: L mid deltoid Pain description: dull aching pain Aggravating factors: pushing Relieving factors: rest/careful movements  PRECAUTIONS: Other: pt reports Dr. Yetta Barre has not mentioned any precautions  WEIGHT BEARING RESTRICTIONS: No  FALLS:  Has patient fallen in last 6 months? No  LIVING ENVIRONMENT: Lives with:  lives with their spouse Lives in: House/apartment Has following equipment at home: Single point cane  OCCUPATION: Retired  PLOF: Independent  PATIENT GOALS: Strengthen L shoulder, get up/down from floor without pulling up on something, reach down to tie his shoes, improve flexibility  NEXT MD VISIT: 11/25/22  OBJECTIVE:   DIAGNOSTIC FINDINGS:  Cervical x-ray 07/02/22 IMPRESSION: Intraoperative images during C3-C6 ACDF. Hardware is intact. No evidence of immediate complication.  PATIENT SURVEYS:  FOTO 46; predicted 58  SENSATION: L phalanges 1-3 N/T  POSTURE: rounded shoulders and forward head, increased thoracic kyphosis  PALPATION: L thoracic paraspinal tenderness   CERVICAL ROM:   Active ROM A/PROM (deg) eval  Flexion 28  Extension 20  Right lateral flexion 20  Left lateral flexion 20  Right rotation 35  Left rotation 30   (Blank rows = not tested)  UPPER EXTREMITY ROM:  Active ROM Right eval Left eval  Shoulder flexion 118 105  Shoulder extension 60 60  Shoulder abduction 142 110*  Shoulder adduction    Shoulder internal rotation L4 L5*  Shoulder external rotation T3 C7*  Elbow flexion    Elbow extension    Wrist flexion    Wrist extension    Wrist ulnar deviation    Wrist radial deviation    Wrist pronation    Wrist supination     (Blank rows = not tested) * = concordant pain  UPPER EXTREMITY MMT:  MMT Right eval Left eval  Shoulder flexion 5 4  Shoulder extension 5 5  Shoulder abduction 5 4  Shoulder adduction    Shoulder internal rotation 5 5  Shoulder external rotation 4 4  Middle trapezius  3  Lower trapezius  3-  Elbow flexion    Elbow extension    Wrist flexion 4 4  Wrist extension 4 4  Wrist ulnar deviation    Wrist radial deviation    Wrist pronation 5 5  Wrist supination 5 4  Grip strength 60, 57, 60 35, 30, 35   (Blank rows = not  tested)  CERVICAL SPECIAL TESTS:  Neck flexor muscle endurance test:  Negative  FUNCTIONAL TESTS:  Did not assess  TODAY'S TREATMENT:      DATE:  09/19/22 UBE L1, 2 min fwd, 2 min bwd Manual therapy: STM & TPR cervical and thoracic paraspinals, STM & pin/stretch lats Prone cervical retraction x10 Prone "I" 2x10 Supine shoulder ER yellow TB 3x10 Supine "W" yellow TB 3x10 Supine shoulder horizontal abd red TB 2x10 Serratus punches 3# 2x10 Shoulder flexion AAROM with cane x3 Lat stretch against wall 2x 30 sec  DATE:  09/16/22 Arm bike: Level 1x 5 minutes (2.5/2.5)-PT present to discuss progress Shoulder ER + scap retraction 2x10, with yellow TB x10- seated on pad and PT cued core activation  Shoulder horizontal abd supine red TB 2x10.  TA activation Serratus punches 3# added 2x15 Supine shoulder flexion AAROM with cane 5x10 sec hold cues  Seated 3 way raises: 1# flexion, no weight for scaption and abduction on Lt x10 each Digiflex: red mass grip: 3 minutes  Bent row on Lt 4# x20                                                                                                              DATE:  09/08/22 Seated cervical retraction 2x10 with scap retraction Shoulder ER + scap retraction 2x10, with yellow TB x10 W 2x10, with yellow TB x10 Shoulder horizontal abd yellow TB 2x10 Seated pendulums x10 Supine shoulder flexion AAROM with cane 5x10 sec hold cues  Supine shoulder abd AAROM with cane x10 Supine shoulder ER at 90 deg abd 2x10 Supine hand squeezes 2x10 Supine wrist flex/ext AROM 2x10 Standing row red TB 2x10 Standing shoulder ext red TB 2x10  PATIENT EDUCATION:  Education details: Exam findings, POC, initial HEP Person educated: Patient Education method: Explanation, Demonstration, and Handouts Education comprehension: verbalized understanding, returned demonstration, and needs further education  HOME EXERCISE PROGRAM: Access Code: [email or web address] URL: [email or web address] Date: 09/19/2022 Prepared by: Vernon Prey April Kirstie Peri  Exercises - Seated Cervical Retraction  - 1 x daily - 7 x weekly - 2 sets - 10 reps - Shoulder External Rotation and Scapular Retraction  - 1 x daily - 7 x weekly - 2 sets - 10 reps - Shoulder External Rotation in 45 Degrees Abduction  - 1 x daily - 7 x weekly - 2 sets - 10 reps - Standing Bilateral Low Shoulder Row with Anchored Resistance  - 1 x daily - 7 x weekly - 2 sets - 10 reps - Shoulder extension with resistance - Neutral  - 1 x daily - 7 x weekly - 2 sets - 10 reps - Supine Shoulder Flexion with Dowel  - 1 x daily - 7 x weekly - 1 sets - 5 reps - 10 sec hold - Supine Shoulder Abduction AAROM with Dowel  - 1 x daily - 7 x weekly - 1 sets - 10 reps - 2-3 sec hold - Latissimus Dorsi Stretch at Wall (Mirrored)  - 1 x daily - 7 x weekly -  2 sets - 30 sec hold  ASSESSMENT:  CLINICAL IMPRESSION: Treatment focused on continuing to progress scapular stability and strength. Pt with some cervical paraspinal tightness addressed with manual work. Pt notes increased lat tightness when attempting shoulder flexion -- improved flexion ROM after manual work/stretching.    OBJECTIVE IMPAIRMENTS: Abnormal gait, decreased activity tolerance, decreased balance, decreased endurance, decreased mobility, difficulty walking, decreased ROM, decreased strength, hypomobility, increased fascial restrictions, impaired flexibility, impaired sensation, impaired UE functional use, improper body mechanics, postural dysfunction, and pain.    GOALS: Goals reviewed with patient? Yes  SHORT TERM GOALS: Target date: 10/03/2022   Pt will be ind with initial HEP Baseline:  Goal status: IN PROGRESS  2.  Pt will have improved L shoulder abd to at least 120 deg for overhead tasks Baseline:  Goal status: INITIAL  LONG TERM GOALS: Target date: 10/31/2022   Pt will be ind with management and progression of HEP Baseline:  Goal status: INITIAL  2.  Pt will have improved FOTO to >/=58 Baseline: 46 Goal  status: INITIAL  3.  Pt will have improved grip strength to >/=50 lbs Baseline:  Goal status: INITIAL  4.  Pt will have improved L = R shoulder AROM without pain for all reaching tasks Baseline:  Goal status: INITIAL  5.  Pt will be able to demonstrate floor to stand transfer ind per pt's personal goals for yard work Baseline:  Goal status: INITIAL    PLAN:  PT FREQUENCY: 2x/week  PT DURATION: 8 weeks  PLANNED INTERVENTIONS: Therapeutic exercises, Therapeutic activity, Neuromuscular re-education, Balance training, Gait training, Patient/Family education, Self Care, Joint mobilization, Stair training, Aquatic Therapy, Dry Needling, Electrical stimulation, Cryotherapy, Moist heat, Taping, Vasopneumatic device, Ionotophoresis 4mg /ml Dexamethasone, Manual therapy, and Re-evaluation  PLAN FOR NEXT SESSION: Add to HEP as needed, work on UE strength, postural strength and incorporate core  Nocholas Damaso April Ma L Verbon Giangregorio, PT 09/19/22 7:58 AM

## 2022-09-24 ENCOUNTER — Ambulatory Visit: Payer: PPO

## 2022-09-24 DIAGNOSIS — M542 Cervicalgia: Secondary | ICD-10-CM | POA: Diagnosis not present

## 2022-09-24 DIAGNOSIS — R293 Abnormal posture: Secondary | ICD-10-CM

## 2022-09-24 DIAGNOSIS — M6281 Muscle weakness (generalized): Secondary | ICD-10-CM

## 2022-09-24 NOTE — Therapy (Signed)
OUTPATIENT PHYSICAL THERAPY CERVICAL TREATMENT   Patient Name: Kevin Griffith MRN: [id number] DOB:09-Jun-1950, 72 y.o., male Today's Date: 09/24/2022  END OF SESSION:  PT End of Session - 09/24/22 0840     Visit Number 5    Date for PT Re-Evaluation 10/31/22    Authorization Type [employer]    PT Start Time 0800    PT Stop Time 0841    PT Time Calculation (min) 41 min    Activity Tolerance Patient tolerated treatment well    Behavior During Therapy WFL for tasks assessed/performed               Past Medical History:  Diagnosis Date   Asymptomatic microscopic hematuria    Back pain, chronic    Cervical stenosis of spine    Colon polyps    DDD (degenerative disc disease), cervical    DDD (degenerative disc disease), lumbar    Drug rash    ED (erectile dysfunction)    Hyperbilirubinemia    Hypertriglyceridemia    IFG (impaired fasting glucose)    Insomnia disorder    Lactose intolerance    Mood change    MRSA (methicillin resistant Staphylococcus aureus)    Palpitations    Paroxysmal A-fib (HCC)    Skin problem    Sleep apnea    no cpap   Sleep apnea, obstructive    Vitamin D deficiency    Past Surgical History:  Procedure Laterality Date   ANTERIOR CERVICAL DECOMP/DISCECTOMY FUSION N/A 07/02/2022   Procedure: Anterior Cervical Decompression Fusion  - Cervical three-Cervical four - Cervical four-Cervical five - Cervical five-Cervical six;  Surgeon: Tia Alert, MD;  Location: [facility] OR;  Service: Neurosurgery;  Laterality: N/A;   APPENDECTOMY  1970   BACK SURGERY  1998, 07-2001   CARPAL TUNNEL RELEASE Left 07/02/2022   Procedure: Left carpal tunnel release;  Surgeon: Tia Alert, MD;  Location: [facility] OR;  Service: Neurosurgery;  Laterality: Left;   HEMIDISCECECTOMY     SHOULDER SURGERY Right    Patient Active Problem List   Diagnosis Date Noted   S/P cervical spinal fusion 07/02/2022    PCP: Rodrigo Ran, MD  REFERRING PROVIDER: Tia Alert,  MD  REFERRING DIAG:  [phone] (ICD-10-CM) - Spinal stenosis, cervical region    THERAPY DIAG:  Cervicalgia  Abnormal posture  Muscle weakness (generalized)  Rationale for Evaluation and Treatment: Rehabilitation  ONSET DATE: 07/02/22 ACDF C3-4, 4-5, 5-6 with left carpal tunnel release  SUBJECTIVE:  SUBJECTIVE STATEMENT: My loss of core strength has my lumbar facet locked up.  My daughter is a PA thinks I might have Cushing Syndrome due to all the stress I've had recently.  I've been staying active at the gym. Some of the exercises made my Rt hand go numb.    From eval: Pt reports he had woken up on Thanksgiving and couldn't move his L arm. Saw PCP and eventually saw neurosurgeon. Pt reports his nerves were crushed in cervical discs and carpal tunnel. Pt states neck and back pain started going away end of March. Pt states he has been mowing the yard. Works out regularly at [employer]. Has been cleared to return to exercise. Has been doing 10# curls. Does note L shoulder atrophy. States he just started being able to type with his L hand. Pt states he is currently wearing a wrist brace. L phalanges 1-3 remain numb -- was told it may be numb for up to 6 months.  Hand dominance: Right  PERTINENT HISTORY:  L4-L5 fusion 2020, R RTC repair  PAIN:  Are you having pain? Yes: NPRS scale: 0 in neck, 2 or 3 at worst/10 Pain location: L mid deltoid Pain description: dull aching pain Aggravating factors: pushing Relieving factors: rest/careful movements  PRECAUTIONS: Other: pt reports Dr. Yetta Barre has not mentioned any precautions  WEIGHT BEARING RESTRICTIONS: No  FALLS:  Has patient fallen in last 6 months? No  LIVING ENVIRONMENT: Lives with: lives with their spouse Lives in:  House/apartment Has following equipment at home: Single point cane  OCCUPATION: Retired  PLOF: Independent  PATIENT GOALS: Strengthen L shoulder, get up/down from floor without pulling up on something, reach down to tie his shoes, improve flexibility  NEXT MD VISIT: 11/25/22  OBJECTIVE:   DIAGNOSTIC FINDINGS:  Cervical x-ray 07/02/22 IMPRESSION: Intraoperative images during C3-C6 ACDF. Hardware is intact. No evidence of immediate complication.  PATIENT SURVEYS:  FOTO 46; predicted 58  SENSATION: L phalanges 1-3 N/T  POSTURE: rounded shoulders and forward head, increased thoracic kyphosis  PALPATION: L thoracic paraspinal tenderness   CERVICAL ROM:   Active ROM A/PROM (deg) eval  Flexion 28  Extension 20  Right lateral flexion 20  Left lateral flexion 20  Right rotation 35  Left rotation 30   (Blank rows = not tested)  UPPER EXTREMITY ROM:  Active ROM Right eval Left eval Lt  09/24/22  Shoulder flexion 118 105 130  Shoulder extension 60 60   Shoulder abduction 142 110* 110  Shoulder adduction     Shoulder internal rotation L4 L5*   Shoulder external rotation T3 C7*   Elbow flexion     Elbow extension     Wrist flexion     Wrist extension     Wrist ulnar deviation     Wrist radial deviation     Wrist pronation     Wrist supination      (Blank rows = not tested) * = concordant pain  UPPER EXTREMITY MMT:  MMT Right eval Right 09/24/22 Left eval Left  09/24/22  Shoulder flexion 5  4   Shoulder extension 5  5   Shoulder abduction 5  4   Shoulder adduction      Shoulder internal rotation 5  5   Shoulder external rotation 4  4   Middle trapezius   3   Lower trapezius   3-   Elbow flexion      Elbow extension      Wrist flexion  4  4   Wrist extension 4  4   Wrist ulnar deviation      Wrist radial deviation      Wrist pronation 5  5   Wrist supination 5  4   Grip strength 60, 57, 60 80# 35, 30, 35 60#   (Blank rows = not tested)  CERVICAL  SPECIAL TESTS:  Neck flexor muscle endurance test: Negative  FUNCTIONAL TESTS:  Did not assess  TODAY'S TREATMENT:      DATE:  09/24/22 UBE L1.5, 6 minutes (3/3)- PT present to discuss progress  Standing chest press: 3# 2x10 Supine chest press with cane: 3# added 2x10 Seated shoulder ER red TB 2x10 with ball squeeze seated for core activation Supine "W" yellow TB 3x10 Seated shoulder horizontal abd red TB 2x10- seated on pad Serratus punches 3# 2x10 Shoulder flexion AAROM with cane x3  DATE:  09/19/22 UBE L1, 2 min fwd, 2 min bwd Manual therapy: STM & TPR cervical and thoracic paraspinals, STM & pin/stretch lats Prone cervical retraction x10 Prone "I" 2x10 Supine shoulder ER yellow TB 3x10 Supine "W" yellow TB 3x10 Supine shoulder horizontal abd red TB 2x10 Serratus punches 3# 2x10 Shoulder flexion AAROM with cane x3 Lat stretch against wall 2x 30 sec  DATE:  09/16/22 Arm bike: Level 1x 5 minutes (2.5/2.5)-PT present to discuss progress Shoulder ER + scap retraction 2x10, with yellow TB x10- seated on pad and PT cued core activation  Shoulder horizontal abd supine red TB 2x10.  TA activation Serratus punches 3# added 2x15 Supine shoulder flexion AAROM with cane 5x10 sec hold cues  Seated 3 way raises: 1# flexion, no weight for scaption and abduction on Lt x10 each Digiflex: red mass grip: 3 minutes  Bent row on Lt 4# x20                                                                                                               PATIENT EDUCATION:  Education details: Exam findings, POC, initial HEP Person educated: Patient Education method: Explanation, Demonstration, and Handouts Education comprehension: verbalized understanding, returned demonstration, and needs further education  HOME EXERCISE PROGRAM: Access Code: [email or web address] URL: [email or web address] Date: 09/19/2022 Prepared by: Vernon Prey April Kirstie Peri  Exercises - Seated Cervical Retraction  -  1 x daily - 7 x weekly - 2 sets - 10 reps - Shoulder External Rotation and Scapular Retraction  - 1 x daily - 7 x weekly - 2 sets - 10 reps - Shoulder External Rotation in 45 Degrees Abduction  - 1 x daily - 7 x weekly - 2 sets - 10 reps - Standing Bilateral Low Shoulder Row with Anchored Resistance  - 1 x daily - 7 x weekly - 2 sets - 10 reps - Shoulder extension with resistance - Neutral  - 1 x daily - 7 x weekly - 2 sets - 10 reps - Supine Shoulder Flexion with Dowel  - 1 x daily - 7 x weekly - 1 sets - 5 reps - 10 sec  hold - Supine Shoulder Abduction AAROM with Dowel  - 1 x daily - 7 x weekly - 1 sets - 10 reps - 2-3 sec hold - Latissimus Dorsi Stretch at Wall (Mirrored)  - 1 x daily - 7 x weekly - 2 sets - 30 sec hold  ASSESSMENT:  CLINICAL IMPRESSION: Treatment focused on continuing to progress scapular stability and strength.  Pt with improved grip strength bilaterally since the start of care.  Pt is significantly weak in his legs and transfers are a challenge. Exercises focused on postural strength and incorporated core cocontraction with this.  Pt with Lt anterior shoulder pain with flexion today. PT monitored for pain, posture and safety.   Patient will benefit from skilled PT to address the below impairments and improve overall function.   OBJECTIVE IMPAIRMENTS: Abnormal gait, decreased activity tolerance, decreased balance, decreased endurance, decreased mobility, difficulty walking, decreased ROM, decreased strength, hypomobility, increased fascial restrictions, impaired flexibility, impaired sensation, impaired UE functional use, improper body mechanics, postural dysfunction, and pain.    GOALS: Goals reviewed with patient? Yes  SHORT TERM GOALS: Target date: 10/03/2022   Pt will be ind with initial HEP Baseline:  Goal status: MET  2.  Pt will have improved L shoulder abd to at least 120 deg for overhead tasks Baseline: 110 (09/24/22) Goal status: In progress   LONG TERM  GOALS: Target date: 10/31/2022   Pt will be ind with management and progression of HEP Baseline:  Goal status: INITIAL  2.  Pt will have improved FOTO to >/=58 Baseline: 46 Goal status: INITIAL  3.  Pt will have improved grip strength to >/=50 lbs Baseline: 09/24/22 Goal status: MET  4.  Pt will have improved L = R shoulder AROM without pain for all reaching tasks Baseline:  Goal status: INITIAL  5.  Pt will be able to demonstrate floor to stand transfer ind per pt's personal goals for yard work Baseline: working on sit to stand Goal status: INITIAL    PLAN:  PT FREQUENCY: 2x/week  PT DURATION: 8 weeks  PLANNED INTERVENTIONS: Therapeutic exercises, Therapeutic activity, Neuromuscular re-education, Balance training, Gait training, Patient/Family education, Self Care, Joint mobilization, Stair training, Aquatic Therapy, Dry Needling, Electrical stimulation, Cryotherapy, Moist heat, Taping, Vasopneumatic device, Ionotophoresis 4mg /ml Dexamethasone, Manual therapy, and Re-evaluation  PLAN FOR NEXT SESSION: Add to HEP as needed, work on UE strength, postural strength and incorporate core  Liani Caris, PT 09/24/22 8:45 AM

## 2022-09-26 ENCOUNTER — Ambulatory Visit: Payer: PPO | Admitting: Physical Therapy

## 2022-09-26 DIAGNOSIS — R293 Abnormal posture: Secondary | ICD-10-CM

## 2022-09-26 DIAGNOSIS — R2689 Other abnormalities of gait and mobility: Secondary | ICD-10-CM

## 2022-09-26 DIAGNOSIS — L03115 Cellulitis of right lower limb: Secondary | ICD-10-CM | POA: Diagnosis not present

## 2022-09-26 DIAGNOSIS — M542 Cervicalgia: Secondary | ICD-10-CM | POA: Diagnosis not present

## 2022-09-26 DIAGNOSIS — M6281 Muscle weakness (generalized): Secondary | ICD-10-CM

## 2022-09-26 NOTE — Therapy (Signed)
OUTPATIENT PHYSICAL THERAPY CERVICAL TREATMENT   Patient Name: Kevin Griffith MRN: [id number] DOB:[birth year], 72 y.o., male Today's Date: 09/26/2022  END OF SESSION:  PT End of Session - 09/26/22 0810     Visit Number 6    Date for PT Re-Evaluation 10/31/22    Authorization Type [employer]    PT Start Time 0805    PT Stop Time 0845    PT Time Calculation (min) 40 min    Activity Tolerance Patient tolerated treatment well    Behavior During Therapy WFL for tasks assessed/performed               Past Medical History:  Diagnosis Date   Asymptomatic microscopic hematuria    Back pain, chronic    Cervical stenosis of spine    Colon polyps    DDD (degenerative disc disease), cervical    DDD (degenerative disc disease), lumbar    Drug rash    ED (erectile dysfunction)    Hyperbilirubinemia    Hypertriglyceridemia    IFG (impaired fasting glucose)    Insomnia disorder    Lactose intolerance    Mood change    MRSA (methicillin resistant Staphylococcus aureus)    Palpitations    Paroxysmal A-fib (HCC)    Skin problem    Sleep apnea    no cpap   Sleep apnea, obstructive    Vitamin D deficiency    Past Surgical History:  Procedure Laterality Date   ANTERIOR CERVICAL DECOMP/DISCECTOMY FUSION N/A 07/02/2022   Procedure: Anterior Cervical Decompression Fusion  - Cervical three-Cervical four - Cervical four-Cervical five - Cervical five-Cervical six;  Surgeon: Tia Alert, MD;  Location: [facility] OR;  Service: Neurosurgery;  Laterality: N/A;   APPENDECTOMY  1970   BACK SURGERY  1998, 07-2001   CARPAL TUNNEL RELEASE Left 07/02/2022   Procedure: Left carpal tunnel release;  Surgeon: Tia Alert, MD;  Location: [facility] OR;  Service: Neurosurgery;  Laterality: Left;   HEMIDISCECECTOMY     SHOULDER SURGERY Right    Patient Active Problem List   Diagnosis Date Noted   S/P cervical spinal fusion 07/02/2022    PCP: Rodrigo Ran, MD  REFERRING PROVIDER: Tia Alert,  MD  REFERRING DIAG:  [phone] (ICD-10-CM) - Spinal stenosis, cervical region    THERAPY DIAG:  Cervicalgia  Abnormal posture  Muscle weakness (generalized)  Other abnormalities of gait and mobility  Rationale for Evaluation and Treatment: Rehabilitation  ONSET DATE: 07/02/22 ACDF C3-4, 4-5, 5-6 with left carpal tunnel release  SUBJECTIVE:  SUBJECTIVE STATEMENT: Pt states he felt a little rough after last PT session. Rested yesterday and is feeling better today. Can hear his L shoulder when he raises his arm up.  From eval: Pt reports he had woken up on Thanksgiving and couldn't move his L arm. Saw PCP and eventually saw neurosurgeon. Pt reports his nerves were crushed in cervical discs and carpal tunnel. Pt states neck and back pain started going away end of March. Pt states he has been mowing the yard. Works out regularly at [employer]. Has been cleared to return to exercise. Has been doing 10# curls. Does note L shoulder atrophy. States he just started being able to type with his L hand. Pt states he is currently wearing a wrist brace. L phalanges 1-3 remain numb -- was told it may be numb for up to 6 months.  Hand dominance: Right  PERTINENT HISTORY:  L4-L5 fusion 2020, R RTC repair  PAIN:  Are you having pain? Yes: NPRS scale: 0 in neck, 2 or 3 at worst/10 Pain location: L mid deltoid Pain description: dull aching pain Aggravating factors: pushing Relieving factors: rest/careful movements  PRECAUTIONS: Other: pt reports Dr. Yetta Barre has not mentioned any precautions  WEIGHT BEARING RESTRICTIONS: No  FALLS:  Has patient fallen in last 6 months? No  LIVING ENVIRONMENT: Lives with: lives with their spouse Lives in: House/apartment Has following equipment at home: Single point  cane  OCCUPATION: Retired  PLOF: Independent  PATIENT GOALS: Strengthen L shoulder, get up/down from floor without pulling up on something, reach down to tie his shoes, improve flexibility  NEXT MD VISIT: 11/25/22  OBJECTIVE:   DIAGNOSTIC FINDINGS:  Cervical x-ray 07/02/22 IMPRESSION: Intraoperative images during C3-C6 ACDF. Hardware is intact. No evidence of immediate complication.  PATIENT SURVEYS:  FOTO 46; predicted 58  SENSATION: L phalanges 1-3 N/T  POSTURE: rounded shoulders and forward head, increased thoracic kyphosis  PALPATION: L thoracic paraspinal tenderness   CERVICAL ROM:   Active ROM A/PROM (deg) eval  Flexion 28  Extension 20  Right lateral flexion 20  Left lateral flexion 20  Right rotation 35  Left rotation 30   (Blank rows = not tested)  UPPER EXTREMITY ROM:  Active ROM Right eval Left eval Lt  09/24/22  Shoulder flexion 118 105 130  Shoulder extension 60 60   Shoulder abduction 142 110* 110  Shoulder adduction     Shoulder internal rotation L4 L5*   Shoulder external rotation T3 C7*   Elbow flexion     Elbow extension     Wrist flexion     Wrist extension     Wrist ulnar deviation     Wrist radial deviation     Wrist pronation     Wrist supination      (Blank rows = not tested) * = concordant pain  UPPER EXTREMITY MMT:  MMT Right eval Right 09/24/22 Left eval Left  09/24/22  Shoulder flexion 5  4   Shoulder extension 5  5   Shoulder abduction 5  4   Shoulder adduction      Shoulder internal rotation 5  5   Shoulder external rotation 4  4   Middle trapezius   3   Lower trapezius   3-   Elbow flexion      Elbow extension      Wrist flexion 4  4   Wrist extension 4  4   Wrist ulnar deviation      Wrist radial  deviation      Wrist pronation 5  5   Wrist supination 5  4   Grip strength 60, 57, 60 80# 35, 30, 35 60#   (Blank rows = not tested)  CERVICAL SPECIAL TESTS:  Neck flexor muscle endurance test:  Negative  FUNCTIONAL TESTS:  Did not assess  TODAY'S TREATMENT:      DATE:  09/26/22 UBE L1.5 6 min (3/3) Seated cervical retraction 10 x 3 sec Seated scap retraction 10 x 3 sec  Manual therapy: L Shoulder PROM for abd, grade II to III GH inferior mobs, STM & TPR pecs and supraspinatus Supine shoulder ER red TB 10x 3 sec Supine "W" red TB 10 x 3 sec Supine shoulder ER at 90 deg abd red TB 10 x 3 sec S/L shoulder abd x10 S/L posterior scapular tilting 2x10 S/L serratus punch 2x10 Standing shoulder ext green TB 10x5 sec Standing palloff press green TB 10x5 sec Standing reactive iso row green TB 10x5 sec  DATE:  09/24/22 UBE L1.5, 6 minutes (3/3)- PT present to discuss progress  Standing chest press: 3# 2x10 Supine chest press with cane: 3# added 2x10 Seated shoulder ER red TB 2x10 with ball squeeze seated for core activation Supine "W" yellow TB 3x10 Seated shoulder horizontal abd red TB 2x10- seated on pad Serratus punches 3# 2x10 Shoulder flexion AAROM with cane x3                                                     PATIENT EDUCATION:  Education details: Exam findings, POC, initial HEP Person educated: Patient Education method: Explanation, Demonstration, and Handouts Education comprehension: verbalized understanding, returned demonstration, and needs further education  HOME EXERCISE PROGRAM: Access Code: [email or web address] URL: [email or web address] Date: 09/26/2022 Prepared by: Vernon Prey April Kirstie Peri  Exercises - Seated Cervical Retraction  - 1 x daily - 7 x weekly - 2 sets - 10 reps - Shoulder External Rotation and Scapular Retraction  - 1 x daily - 7 x weekly - 2 sets - 10 reps - Shoulder External Rotation in 45 Degrees Abduction  - 1 x daily - 7 x weekly - 2 sets - 10 reps - Standing Bilateral Low Shoulder Row with Anchored Resistance  - 1 x daily - 7 x weekly - 2 sets - 10 reps - Shoulder extension with resistance - Neutral  - 1 x daily - 7 x weekly - 2  sets - 10 reps - Supine Shoulder Flexion with Dowel  - 1 x daily - 7 x weekly - 1 sets - 5 reps - 10 sec hold - Sidelying Shoulder Abduction Palm Forward  - 1 x daily - 7 x weekly - 2 sets - 10 reps - Latissimus Dorsi Stretch at Wall (Mirrored)  - 1 x daily - 7 x weekly - 2 sets - 30 sec hold  ASSESSMENT:  CLINICAL IMPRESSION: Continued to focus on posterior shoulder girdle and core strengthening. Worked on progressing shoulder abduction -- very weak and easily fatigued.   OBJECTIVE IMPAIRMENTS: Abnormal gait, decreased activity tolerance, decreased balance, decreased endurance, decreased mobility, difficulty walking, decreased ROM, decreased strength, hypomobility, increased fascial restrictions, impaired flexibility, impaired sensation, impaired UE functional use, improper body mechanics, postural dysfunction, and pain.    GOALS: Goals reviewed with patient? Yes  SHORT TERM GOALS: Target date: 10/03/2022  Pt will be ind with initial HEP Baseline:  Goal status: MET  2.  Pt will have improved L shoulder abd to at least 120 deg for overhead tasks Baseline: 110 (09/24/22) Goal status: In progress   LONG TERM GOALS: Target date: 10/31/2022   Pt will be ind with management and progression of HEP Baseline:  Goal status: INITIAL  2.  Pt will have improved FOTO to >/=58 Baseline: 46 Goal status: INITIAL  3.  Pt will have improved grip strength to >/=50 lbs Baseline: 09/24/22 Goal status: MET  4.  Pt will have improved L = R shoulder AROM without pain for all reaching tasks Baseline:  Goal status: INITIAL  5.  Pt will be able to demonstrate floor to stand transfer ind per pt's personal goals for yard work Baseline: working on sit to stand Goal status: INITIAL    PLAN:  PT FREQUENCY: 2x/week  PT DURATION: 8 weeks  PLANNED INTERVENTIONS: Therapeutic exercises, Therapeutic activity, Neuromuscular re-education, Balance training, Gait training, Patient/Family education, Self  Care, Joint mobilization, Stair training, Aquatic Therapy, Dry Needling, Electrical stimulation, Cryotherapy, Moist heat, Taping, Vasopneumatic device, Ionotophoresis 4mg /ml Dexamethasone, Manual therapy, and Re-evaluation  PLAN FOR NEXT SESSION: Add to HEP as needed, work on UE strength, postural strength and incorporate core  Kearah Gayden April Ma L Linton Stolp, PT 09/26/22 8:10 AM

## 2022-09-29 ENCOUNTER — Ambulatory Visit: Payer: PPO | Attending: Neurological Surgery | Admitting: Physical Therapy

## 2022-09-29 DIAGNOSIS — R293 Abnormal posture: Secondary | ICD-10-CM | POA: Insufficient documentation

## 2022-09-29 DIAGNOSIS — M6281 Muscle weakness (generalized): Secondary | ICD-10-CM | POA: Insufficient documentation

## 2022-09-29 DIAGNOSIS — M542 Cervicalgia: Secondary | ICD-10-CM | POA: Insufficient documentation

## 2022-09-29 DIAGNOSIS — R2689 Other abnormalities of gait and mobility: Secondary | ICD-10-CM | POA: Insufficient documentation

## 2022-09-29 NOTE — Therapy (Signed)
OUTPATIENT PHYSICAL THERAPY CERVICAL TREATMENT   Patient Name: BRIK BRADFORD MRN: [id number] DOB:December 21, 1950, 72 y.o., male Today's Date: 09/29/2022  END OF SESSION:  PT End of Session - 09/29/22 0926     Visit Number 7    Date for PT Re-Evaluation 10/31/22    Authorization Type [employer]    PT Start Time 0930    PT Stop Time 1015    PT Time Calculation (min) 45 min    Activity Tolerance Patient tolerated treatment well    Behavior During Therapy WFL for tasks assessed/performed               Past Medical History:  Diagnosis Date   Asymptomatic microscopic hematuria    Back pain, chronic    Cervical stenosis of spine    Colon polyps    DDD (degenerative disc disease), cervical    DDD (degenerative disc disease), lumbar    Drug rash    ED (erectile dysfunction)    Hyperbilirubinemia    Hypertriglyceridemia    IFG (impaired fasting glucose)    Insomnia disorder    Lactose intolerance    Mood change    MRSA (methicillin resistant Staphylococcus aureus)    Palpitations    Paroxysmal A-fib (HCC)    Skin problem    Sleep apnea    no cpap   Sleep apnea, obstructive    Vitamin D deficiency    Past Surgical History:  Procedure Laterality Date   ANTERIOR CERVICAL DECOMP/DISCECTOMY FUSION N/A 07/02/2022   Procedure: Anterior Cervical Decompression Fusion  - Cervical three-Cervical four - Cervical four-Cervical five - Cervical five-Cervical six;  Surgeon: Tia Alert, MD;  Location: [facility] OR;  Service: Neurosurgery;  Laterality: N/A;   APPENDECTOMY  1970   BACK SURGERY  1998, 07-2001   CARPAL TUNNEL RELEASE Left 07/02/2022   Procedure: Left carpal tunnel release;  Surgeon: Tia Alert, MD;  Location: [facility] OR;  Service: Neurosurgery;  Laterality: Left;   HEMIDISCECECTOMY     SHOULDER SURGERY Right    Patient Active Problem List   Diagnosis Date Noted   S/P cervical spinal fusion 07/02/2022    PCP: Rodrigo Ran, MD  REFERRING PROVIDER: Tia Alert,  MD  REFERRING DIAG:  [phone] (ICD-10-CM) - Spinal stenosis, cervical region    THERAPY DIAG:  Cervicalgia  Abnormal posture  Muscle weakness (generalized)  Other abnormalities of gait and mobility  Rationale for Evaluation and Treatment: Rehabilitation  ONSET DATE: 07/02/22 ACDF C3-4, 4-5, 5-6 with left carpal tunnel release  SUBJECTIVE:  SUBJECTIVE STATEMENT: Pt reports he is on doxyclycline for his foot. Pt states he does have less N/T in left hand but is feeling it more in his right hand. Pt notes soreness/pain in L shoulder. States he has a spot in his midback.  Hand dominance: Right  PERTINENT HISTORY:  L4-L5 fusion 2020, R RTC repair  PAIN:  Are you having pain? Yes: NPRS scale: 0 in neck, 2 or 3 at worst/10 Pain location: L mid deltoid Pain description: dull aching pain Aggravating factors: pushing Relieving factors: rest/careful movements  PRECAUTIONS: Other: pt reports Dr. Yetta Barre has not mentioned any precautions  WEIGHT BEARING RESTRICTIONS: No  FALLS:  Has patient fallen in last 6 months? No  LIVING ENVIRONMENT: Lives with: lives with their spouse Lives in: House/apartment Has following equipment at home: Single point cane  OCCUPATION: Retired  PLOF: Independent  PATIENT GOALS: Strengthen L shoulder, get up/down from floor without pulling up on something, reach down to tie his shoes, improve flexibility  NEXT MD VISIT: 11/25/22  OBJECTIVE: (Measures in this section from initial evaluation unless otherwise noted)  DIAGNOSTIC FINDINGS:  Cervical x-ray 07/02/22 IMPRESSION: Intraoperative images during C3-C6 ACDF. Hardware is intact. No evidence of immediate complication.  PATIENT SURVEYS:  FOTO 46; predicted 58  SENSATION: L phalanges 1-3 N/T  POSTURE:  rounded shoulders and forward head, increased thoracic kyphosis  PALPATION: L thoracic paraspinal tenderness   CERVICAL ROM:   Active ROM A/PROM (deg) eval  Flexion 28  Extension 20  Right lateral flexion 20  Left lateral flexion 20  Right rotation 35  Left rotation 30   (Blank rows = not tested)  UPPER EXTREMITY ROM:  Active ROM Right eval Left eval Lt  09/24/22  Shoulder flexion 118 105 130  Shoulder extension 60 60   Shoulder abduction 142 110* 110  Shoulder adduction     Shoulder internal rotation L4 L5*   Shoulder external rotation T3 C7*   Elbow flexion     Elbow extension     Wrist flexion     Wrist extension     Wrist ulnar deviation     Wrist radial deviation     Wrist pronation     Wrist supination      (Blank rows = not tested) * = concordant pain  UPPER EXTREMITY MMT:  MMT Right eval Right 09/24/22 Left eval Left  09/24/22  Shoulder flexion 5  4   Shoulder extension 5  5   Shoulder abduction 5  4   Shoulder adduction      Shoulder internal rotation 5  5   Shoulder external rotation 4  4   Middle trapezius   3   Lower trapezius   3-   Elbow flexion      Elbow extension      Wrist flexion 4  4   Wrist extension 4  4   Wrist ulnar deviation      Wrist radial deviation      Wrist pronation 5  5   Wrist supination 5  4   Grip strength 60, 57, 60 80# 35, 30, 35 60#   (Blank rows = not tested)  CERVICAL SPECIAL TESTS:  Neck flexor muscle endurance test: Negative  FUNCTIONAL TESTS:  Did not assess  TODAY'S TREATMENT:      DATE:  09/29/22 UBE L1.5 6 min (3/3) Manual therapy: STM & TPR L rhomboids and thoracic paraspinals, lats S/L scapular clock x10 each direction S/L open/close book 10x3 sec S/L  shoulder abd yellow TB 2x5 S/L shoulder flexion yellow TB 3x5 S/L bow and arrow yellow TB 2x10 Seated serratus punch yellow TB 2x10 Standing shoulder ext green TB 2x10x5 sec Standing palloff press green TB 2x10x5 sec Standing reactive  isometric row green TB 2x10  09/26/22 UBE L1.5 6 min (3/3) Seated cervical retraction 10 x 3 sec Seated scap retraction 10 x 3 sec  Manual therapy: L Shoulder PROM for abd, grade II to III GH inferior mobs, STM & TPR pecs and supraspinatus Supine shoulder ER red TB 10x 3 sec Supine "W" red TB 10 x 3 sec Supine shoulder ER at 90 deg abd red TB 10 x 3 sec S/L shoulder abd x10 S/L posterior scapular tilting 2x10 S/L serratus punch 2x10 Standing shoulder ext green TB 10x5 sec Standing palloff press green TB 10x5 sec Standing reactive iso row green TB 10x5 sec                                                      PATIENT EDUCATION:  Education details: Exam findings, POC, initial HEP Person educated: Patient Education method: Explanation, Demonstration, and Handouts Education comprehension: verbalized understanding, returned demonstration, and needs further education  HOME EXERCISE PROGRAM: Access Code: [email or web address] URL: [email or web address] Date: 09/26/2022 Prepared by: Vernon Prey April Kirstie Peri  Exercises - Seated Cervical Retraction  - 1 x daily - 7 x weekly - 2 sets - 10 reps - Shoulder External Rotation and Scapular Retraction  - 1 x daily - 7 x weekly - 2 sets - 10 reps - Shoulder External Rotation in 45 Degrees Abduction  - 1 x daily - 7 x weekly - 2 sets - 10 reps - Standing Bilateral Low Shoulder Row with Anchored Resistance  - 1 x daily - 7 x weekly - 2 sets - 10 reps - Shoulder extension with resistance - Neutral  - 1 x daily - 7 x weekly - 2 sets - 10 reps - Supine Shoulder Flexion with Dowel  - 1 x daily - 7 x weekly - 1 sets - 5 reps - 10 sec hold - Sidelying Shoulder Abduction Palm Forward  - 1 x daily - 7 x weekly - 2 sets - 10 reps - Latissimus Dorsi Stretch at Wall (Mirrored)  - 1 x daily - 7 x weekly - 2 sets - 30 sec hold  ASSESSMENT:  CLINICAL IMPRESSION: Pt tolerated addition of yellow TB for RTC strengthening in sidelying. Continued scapular  strengthening with good pt tolerance.   OBJECTIVE IMPAIRMENTS: Abnormal gait, decreased activity tolerance, decreased balance, decreased endurance, decreased mobility, difficulty walking, decreased ROM, decreased strength, hypomobility, increased fascial restrictions, impaired flexibility, impaired sensation, impaired UE functional use, improper body mechanics, postural dysfunction, and pain.    GOALS: Goals reviewed with patient? Yes  SHORT TERM GOALS: Target date: 10/03/2022   Pt will be ind with initial HEP Baseline:  Goal status: MET  2.  Pt will have improved L shoulder abd to at least 120 deg for overhead tasks Baseline: 110 (09/24/22) Goal status: In progress   LONG TERM GOALS: Target date: 10/31/2022   Pt will be ind with management and progression of HEP Baseline:  Goal status: INITIAL  2.  Pt will have improved FOTO to >/=58 Baseline: 46 Goal status: INITIAL  3.  Pt will have improved grip strength  to >/=50 lbs Baseline: 09/24/22 Goal status: MET  4.  Pt will have improved L = R shoulder AROM without pain for all reaching tasks Baseline:  Goal status: INITIAL  5.  Pt will be able to demonstrate floor to stand transfer ind per pt's personal goals for yard work Baseline: working on sit to stand Goal status: INITIAL    PLAN:  PT FREQUENCY: 2x/week  PT DURATION: 8 weeks  PLANNED INTERVENTIONS: Therapeutic exercises, Therapeutic activity, Neuromuscular re-education, Balance training, Gait training, Patient/Family education, Self Care, Joint mobilization, Stair training, Aquatic Therapy, Dry Needling, Electrical stimulation, Cryotherapy, Moist heat, Taping, Vasopneumatic device, Ionotophoresis 4mg /ml Dexamethasone, Manual therapy, and Re-evaluation  PLAN FOR NEXT SESSION: Add to HEP as needed, work on UE strength, postural strength and incorporate core  Brentin Shin April Ma L Maridee Slape, PT 09/29/22 9:26 AM

## 2022-10-03 ENCOUNTER — Ambulatory Visit: Payer: PPO | Admitting: Physical Therapy

## 2022-10-03 DIAGNOSIS — M542 Cervicalgia: Secondary | ICD-10-CM

## 2022-10-03 DIAGNOSIS — R2689 Other abnormalities of gait and mobility: Secondary | ICD-10-CM

## 2022-10-03 DIAGNOSIS — R293 Abnormal posture: Secondary | ICD-10-CM

## 2022-10-03 DIAGNOSIS — M6281 Muscle weakness (generalized): Secondary | ICD-10-CM

## 2022-10-03 NOTE — Therapy (Signed)
OUTPATIENT PHYSICAL THERAPY CERVICAL TREATMENT   Patient Name: Kevin Griffith MRN: [id number] DOB:1951-03-15, 72 y.o., male Today's Date: 10/03/2022  END OF SESSION:  PT End of Session - 10/03/22 0807     Visit Number 8    Date for PT Re-Evaluation 10/31/22    Authorization Type [employer]    PT Start Time 0805    PT Stop Time 0845    PT Time Calculation (min) 40 min    Activity Tolerance Patient tolerated treatment well    Behavior During Therapy WFL for tasks assessed/performed               Past Medical History:  Diagnosis Date   Asymptomatic microscopic hematuria    Back pain, chronic    Cervical stenosis of spine    Colon polyps    DDD (degenerative disc disease), cervical    DDD (degenerative disc disease), lumbar    Drug rash    ED (erectile dysfunction)    Hyperbilirubinemia    Hypertriglyceridemia    IFG (impaired fasting glucose)    Insomnia disorder    Lactose intolerance    Mood change    MRSA (methicillin resistant Staphylococcus aureus)    Palpitations    Paroxysmal A-fib (HCC)    Skin problem    Sleep apnea    no cpap   Sleep apnea, obstructive    Vitamin D deficiency    Past Surgical History:  Procedure Laterality Date   ANTERIOR CERVICAL DECOMP/DISCECTOMY FUSION N/A 07/02/2022   Procedure: Anterior Cervical Decompression Fusion  - Cervical three-Cervical four - Cervical four-Cervical five - Cervical five-Cervical six;  Surgeon: Tia Alert, MD;  Location: [facility] OR;  Service: Neurosurgery;  Laterality: N/A;   APPENDECTOMY  1970   BACK SURGERY  1998, 07-2001   CARPAL TUNNEL RELEASE Left 07/02/2022   Procedure: Left carpal tunnel release;  Surgeon: Tia Alert, MD;  Location: [facility] OR;  Service: Neurosurgery;  Laterality: Left;   HEMIDISCECECTOMY     SHOULDER SURGERY Right    Patient Active Problem List   Diagnosis Date Noted   S/P cervical spinal fusion 07/02/2022    PCP: Rodrigo Ran, MD  REFERRING PROVIDER: Tia Alert,  MD  REFERRING DIAG:  [phone] (ICD-10-CM) - Spinal stenosis, cervical region    THERAPY DIAG:  Cervicalgia  Abnormal posture  Muscle weakness (generalized)  Other abnormalities of gait and mobility  Rationale for Evaluation and Treatment: Rehabilitation  ONSET DATE: 07/02/22 ACDF C3-4, 4-5, 5-6 with left carpal tunnel release  SUBJECTIVE:  SUBJECTIVE STATEMENT: Pt states he had a terrible week from the doxy; however, his foot is pretty much all healed. "I didn't do any of the overhead things. It crunches." Has not done pulleys this week and has noted less flexibility Hand dominance: Right  PERTINENT HISTORY:  L4-L5 fusion 2020, R RTC repair  PAIN:  Are you having pain? Yes: NPRS scale: 0 in neck, 2 or 3 at worst/10 Pain location: L mid deltoid Pain description: dull aching pain Aggravating factors: pushing Relieving factors: rest/careful movements  PRECAUTIONS: Other: pt reports Dr. Yetta Barre has not mentioned any precautions  WEIGHT BEARING RESTRICTIONS: No  FALLS:  Has patient fallen in last 6 months? No  LIVING ENVIRONMENT: Lives with: lives with their spouse Lives in: House/apartment Has following equipment at home: Single point cane  OCCUPATION: Retired  PLOF: Independent  PATIENT GOALS: Strengthen L shoulder, get up/down from floor without pulling up on something, reach down to tie his shoes, improve flexibility  NEXT MD VISIT: 11/25/22  OBJECTIVE: (Measures in this section from initial evaluation unless otherwise noted)  DIAGNOSTIC FINDINGS:  Cervical x-ray 07/02/22 IMPRESSION: Intraoperative images during C3-C6 ACDF. Hardware is intact. No evidence of immediate complication.  PATIENT SURVEYS:  FOTO 46; predicted 58  SENSATION: L phalanges 1-3 N/T  POSTURE:  rounded shoulders and forward head, increased thoracic kyphosis  PALPATION: L thoracic paraspinal tenderness   CERVICAL ROM:   Active ROM A/PROM (deg) eval  Flexion 28  Extension 20  Right lateral flexion 20  Left lateral flexion 20  Right rotation 35  Left rotation 30   (Blank rows = not tested)  UPPER EXTREMITY ROM:  Active ROM Right eval Left eval Lt  09/24/22 Lt 10/03/22  Shoulder flexion 118 105 130 130 in sup  Shoulder extension 60 60    Shoulder abduction 142 110* 110 120 in sup  Shoulder adduction      Shoulder internal rotation L4 L5*    Shoulder external rotation T3 C7*    Elbow flexion      Elbow extension      Wrist flexion      Wrist extension      Wrist ulnar deviation      Wrist radial deviation      Wrist pronation      Wrist supination       (Blank rows = not tested) * = concordant pain  UPPER EXTREMITY MMT:  MMT Right eval Right 09/24/22 Left eval Left  09/24/22  Shoulder flexion 5  4   Shoulder extension 5  5   Shoulder abduction 5  4   Shoulder adduction      Shoulder internal rotation 5  5   Shoulder external rotation 4  4   Middle trapezius   3   Lower trapezius   3-   Elbow flexion      Elbow extension      Wrist flexion 4  4   Wrist extension 4  4   Wrist ulnar deviation      Wrist radial deviation      Wrist pronation 5  5   Wrist supination 5  4   Grip strength 60, 57, 60 80# 35, 30, 35 60#   (Blank rows = not tested)  CERVICAL SPECIAL TESTS:  Neck flexor muscle endurance test: Negative  FUNCTIONAL TESTS:  Did not assess  TODAY'S TREATMENT:      DATE:  10/03/22 UBE L2 6 min (3 fwd, 3 bwd) Seated thoracic extension against  ball x10 Standing doorway stretch low, mid x30 sec each Standing wall scaption AAROM x10 Supine shoulder flexion x5 S/L shoulder abd x5 S/L shoulder ER 3# 2x10 Manual therapy: STM & TPR L pecs, lats; grade II to III joint inferior and posterior shoulder mobs; shoulder PROM all directions Standing  against pool noodle scap squeeze 10x5 sec Standing against pool noodle cervical retraction 10x5 sec  09/29/22 UBE L1.5 6 min (3/3) Manual therapy: STM & TPR L rhomboids and thoracic paraspinals, lats S/L scapular clock x10 each direction S/L open/close book 10x3 sec S/L shoulder abd yellow TB 2x5 S/L shoulder flexion yellow TB 3x5 S/L bow and arrow yellow TB 2x10 Seated serratus punch yellow TB 2x10 Standing shoulder ext green TB 2x10x5 sec Standing palloff press green TB 2x10x5 sec Standing reactive isometric row green TB 2x10  09/26/22 UBE L1.5 6 min (3/3) Seated cervical retraction 10 x 3 sec Seated scap retraction 10 x 3 sec  Manual therapy: L Shoulder PROM for abd, grade II to III GH inferior mobs, STM & TPR pecs and supraspinatus Supine shoulder ER red TB 10x 3 sec Supine "W" red TB 10 x 3 sec Supine shoulder ER at 90 deg abd red TB 10 x 3 sec S/L shoulder abd x10 S/L posterior scapular tilting 2x10 S/L serratus punch 2x10 Standing shoulder ext green TB 10x5 sec Standing palloff press green TB 10x5 sec Standing reactive iso row green TB 10x5 sec                                                      PATIENT EDUCATION:  Education details: Exam findings, POC, initial HEP Person educated: Patient Education method: Explanation, Demonstration, and Handouts Education comprehension: verbalized understanding, returned demonstration, and needs further education  HOME EXERCISE PROGRAM: Access Code: [email or web address] URL: [email or web address] Date: 09/26/2022 Prepared by: Vernon Prey April Kirstie Peri  Exercises - Seated Cervical Retraction  - 1 x daily - 7 x weekly - 2 sets - 10 reps - Shoulder External Rotation and Scapular Retraction  - 1 x daily - 7 x weekly - 2 sets - 10 reps - Shoulder External Rotation in 45 Degrees Abduction  - 1 x daily - 7 x weekly - 2 sets - 10 reps - Standing Bilateral Low Shoulder Row with Anchored Resistance  - 1 x daily - 7 x weekly - 2 sets -  10 reps - Shoulder extension with resistance - Neutral  - 1 x daily - 7 x weekly - 2 sets - 10 reps - Supine Shoulder Flexion with Dowel  - 1 x daily - 7 x weekly - 1 sets - 5 reps - 10 sec hold - Sidelying Shoulder Abduction Palm Forward  - 1 x daily - 7 x weekly - 2 sets - 10 reps - Latissimus Dorsi Stretch at Wall (Mirrored)  - 1 x daily - 7 x weekly - 2 sets - 30 sec hold  ASSESSMENT:  CLINICAL IMPRESSION: Pt's shoulder was very tight this session with increased thoracic kyphosis. Pecs were the most tight. Performed more manual work and ROM vs. Strengthening. Pt with increased range by end of session. Less shoulder crunching when able to maintain thoracic extension and scap retraction. Pt is demonstrating increased range but without active assist still is limited by pain.   OBJECTIVE IMPAIRMENTS: Abnormal gait, decreased activity  tolerance, decreased balance, decreased endurance, decreased mobility, difficulty walking, decreased ROM, decreased strength, hypomobility, increased fascial restrictions, impaired flexibility, impaired sensation, impaired UE functional use, improper body mechanics, postural dysfunction, and pain.    GOALS: Goals reviewed with patient? Yes  SHORT TERM GOALS: Target date: 10/03/2022   Pt will be ind with initial HEP Baseline:  Goal status: MET  2.  Pt will have improved L shoulder abd to at least 120 deg for overhead tasks Baseline: 110 (09/24/22) 10/03/22: Supine ~130 deg (10/03/22), unable to obtain actively without increased pain Goal status: In progress   LONG TERM GOALS: Target date: 10/31/2022   Pt will be ind with management and progression of HEP Baseline:  Goal status: INITIAL  2.  Pt will have improved FOTO to >/=58 Baseline: 46 Goal status: INITIAL  3.  Pt will have improved grip strength to >/=50 lbs Baseline: 09/24/22 Goal status: MET  4.  Pt will have improved L = R shoulder AROM without pain for all reaching tasks Baseline:  Goal status:  INITIAL  5.  Pt will be able to demonstrate floor to stand transfer ind per pt's personal goals for yard work Baseline: working on sit to stand Goal status: INITIAL    PLAN:  PT FREQUENCY: 2x/week  PT DURATION: 8 weeks  PLANNED INTERVENTIONS: Therapeutic exercises, Therapeutic activity, Neuromuscular re-education, Balance training, Gait training, Patient/Family education, Self Care, Joint mobilization, Stair training, Aquatic Therapy, Dry Needling, Electrical stimulation, Cryotherapy, Moist heat, Taping, Vasopneumatic device, Ionotophoresis 4mg /ml Dexamethasone, Manual therapy, and Re-evaluation  PLAN FOR NEXT SESSION: Add to HEP as needed, work on UE strength, postural strength and incorporate core  Vona Whiters April Ma L Nasire Reali, PT 10/03/22 8:07 AM

## 2022-10-06 ENCOUNTER — Ambulatory Visit: Payer: PPO | Admitting: Physical Therapy

## 2022-10-06 DIAGNOSIS — M542 Cervicalgia: Secondary | ICD-10-CM | POA: Diagnosis not present

## 2022-10-06 DIAGNOSIS — R2689 Other abnormalities of gait and mobility: Secondary | ICD-10-CM

## 2022-10-06 DIAGNOSIS — M6281 Muscle weakness (generalized): Secondary | ICD-10-CM

## 2022-10-06 DIAGNOSIS — R293 Abnormal posture: Secondary | ICD-10-CM

## 2022-10-06 NOTE — Therapy (Signed)
OUTPATIENT PHYSICAL THERAPY CERVICAL TREATMENT   Patient Name: RUARI RODINO MRN: [id number] DOB:[birth year], 72 y.o., male Today's Date: 10/06/2022  END OF SESSION:  PT End of Session - 10/06/22 0800     Visit Number 9    Date for PT Re-Evaluation 10/31/22    Authorization Type [employer]    PT Start Time 0800    PT Stop Time 0840    PT Time Calculation (min) 40 min    Activity Tolerance Patient tolerated treatment well    Behavior During Therapy WFL for tasks assessed/performed               Past Medical History:  Diagnosis Date   Asymptomatic microscopic hematuria    Back pain, chronic    Cervical stenosis of spine    Colon polyps    DDD (degenerative disc disease), cervical    DDD (degenerative disc disease), lumbar    Drug rash    ED (erectile dysfunction)    Hyperbilirubinemia    Hypertriglyceridemia    IFG (impaired fasting glucose)    Insomnia disorder    Lactose intolerance    Mood change    MRSA (methicillin resistant Staphylococcus aureus)    Palpitations    Paroxysmal A-fib (HCC)    Skin problem    Sleep apnea    no cpap   Sleep apnea, obstructive    Vitamin D deficiency    Past Surgical History:  Procedure Laterality Date   ANTERIOR CERVICAL DECOMP/DISCECTOMY FUSION N/A 07/02/2022   Procedure: Anterior Cervical Decompression Fusion  - Cervical three-Cervical four - Cervical four-Cervical five - Cervical five-Cervical six;  Surgeon: Tia Alert, MD;  Location: [facility] OR;  Service: Neurosurgery;  Laterality: N/A;   APPENDECTOMY  1970   BACK SURGERY  1998, 07-2001   CARPAL TUNNEL RELEASE Left 07/02/2022   Procedure: Left carpal tunnel release;  Surgeon: Tia Alert, MD;  Location: [facility] OR;  Service: Neurosurgery;  Laterality: Left;   HEMIDISCECECTOMY     SHOULDER SURGERY Right    Patient Active Problem List   Diagnosis Date Noted   S/P cervical spinal fusion 07/02/2022    PCP: Rodrigo Ran, MD  REFERRING PROVIDER: Tia Alert,  MD  REFERRING DIAG:  [id number] (ICD-10-CM) - Spinal stenosis, cervical region    THERAPY DIAG:  Cervicalgia  Abnormal posture  Muscle weakness (generalized)  Other abnormalities of gait and mobility  Rationale for Evaluation and Treatment: Rehabilitation  ONSET DATE: 07/02/22 ACDF C3-4, 4-5, 5-6 with left carpal tunnel release  SUBJECTIVE:  SUBJECTIVE STATEMENT: "It was a weird weekend." Pt states he drifted off to sleep watching TV and his left shoulder was quivering in a spasm.  Hand dominance: Right  PERTINENT HISTORY:  L4-L5 fusion 2020, R RTC repair  PAIN:  Are you having pain? Yes: NPRS scale: 0 in neck, 2 or 3 at worst/10 Pain location: L mid deltoid Pain description: dull aching pain Aggravating factors: pushing Relieving factors: rest/careful movements  PRECAUTIONS: Other: pt reports Dr. Yetta Barre has not mentioned any precautions  WEIGHT BEARING RESTRICTIONS: No  FALLS:  Has patient fallen in last 6 months? No  LIVING ENVIRONMENT: Lives with: lives with their spouse Lives in: House/apartment Has following equipment at home: Single point cane  OCCUPATION: Retired  PLOF: Independent  PATIENT GOALS: Strengthen L shoulder, get up/down from floor without pulling up on something, reach down to tie his shoes, improve flexibility  NEXT MD VISIT: 11/25/22  OBJECTIVE: (Measures in this section from initial evaluation unless otherwise noted)  DIAGNOSTIC FINDINGS:  Cervical x-ray 07/02/22 IMPRESSION: Intraoperative images during C3-C6 ACDF. Hardware is intact. No evidence of immediate complication.  PATIENT SURVEYS:  FOTO 46; predicted 58  SENSATION: L phalanges 1-3 N/T  POSTURE: rounded shoulders and forward head, increased thoracic kyphosis  PALPATION: L thoracic  paraspinal tenderness   CERVICAL ROM:   Active ROM A/PROM (deg) eval  Flexion 28  Extension 20  Right lateral flexion 20  Left lateral flexion 20  Right rotation 35  Left rotation 30   (Blank rows = not tested)  UPPER EXTREMITY ROM:  Active ROM Right eval Left eval Lt  09/24/22 Lt 10/03/22  Shoulder flexion 118 105 130 130 in sup  Shoulder extension 60 60    Shoulder abduction 142 110* 110 120 in sup  Shoulder adduction      Shoulder internal rotation L4 L5*    Shoulder external rotation T3 C7*    Elbow flexion      Elbow extension      Wrist flexion      Wrist extension      Wrist ulnar deviation      Wrist radial deviation      Wrist pronation      Wrist supination       (Blank rows = not tested) * = concordant pain  UPPER EXTREMITY MMT:  MMT Right eval Right 09/24/22 Left eval Left  09/24/22  Shoulder flexion 5  4   Shoulder extension 5  5   Shoulder abduction 5  4   Shoulder adduction      Shoulder internal rotation 5  5   Shoulder external rotation 4  4   Middle trapezius   3   Lower trapezius   3-   Elbow flexion      Elbow extension      Wrist flexion 4  4   Wrist extension 4  4   Wrist ulnar deviation      Wrist radial deviation      Wrist pronation 5  5   Wrist supination 5  4   Grip strength 60, 57, 60 80# 35, 30, 35 60#   (Blank rows = not tested)  CERVICAL SPECIAL TESTS:  Neck flexor muscle endurance test: Negative  FUNCTIONAL TESTS:  Did not assess  TODAY'S TREATMENT:      DATE:  10/06/22 UBE L2 6 min (3 fwd, 3 bwd) Seated thoracic extension against ball x10 Seated shoulder ER red TB 2x10 Seated shoulder W red TB 2x10 Seated  shoulder horizontal abd red TB 2x10 Wall plank + scapular clock 2x10 Wall push up plus x5 Standing rows green TB 2x10 Standing shoulder ext green TB 2x10 Standing shoulder abduction x10 Standing shoulder scaption x10 Standing shoulder flexion x10 (reports some crunching)  10/03/22 UBE L2 6 min (3 fwd, 3  bwd) Seated thoracic extension against ball x10 Standing doorway stretch low, mid x30 sec each Standing wall scaption AAROM x10 Supine shoulder flexion x5 S/L shoulder abd x5 S/L shoulder ER 3# 2x10 Manual therapy: STM & TPR L pecs, lats; grade II to III joint inferior and posterior shoulder mobs; shoulder PROM all directions Standing against pool noodle scap squeeze 10x5 sec Standing against pool noodle cervical retraction 10x5 sec  09/29/22 UBE L1.5 6 min (3/3) Manual therapy: STM & TPR L rhomboids and thoracic paraspinals, lats S/L scapular clock x10 each direction S/L open/close book 10x3 sec S/L shoulder abd yellow TB 2x5 S/L shoulder flexion yellow TB 3x5 S/L bow and arrow yellow TB 2x10 Seated serratus punch yellow TB 2x10 Standing shoulder ext green TB 2x10x5 sec Standing palloff press green TB 2x10x5 sec Standing reactive isometric row green TB 2x10  09/26/22 UBE L1.5 6 min (3/3) Seated cervical retraction 10 x 3 sec Seated scap retraction 10 x 3 sec  Manual therapy: L Shoulder PROM for abd, grade II to III GH inferior mobs, STM & TPR pecs and supraspinatus Supine shoulder ER red TB 10x 3 sec Supine "W" red TB 10 x 3 sec Supine shoulder ER at 90 deg abd red TB 10 x 3 sec S/L shoulder abd x10 S/L posterior scapular tilting 2x10 S/L serratus punch 2x10 Standing shoulder ext green TB 10x5 sec Standing palloff press green TB 10x5 sec Standing reactive iso row green TB 10x5 sec                                                      PATIENT EDUCATION:  Education details: Exam findings, POC, initial HEP Person educated: Patient Education method: Explanation, Demonstration, and Handouts Education comprehension: verbalized understanding, returned demonstration, and needs further education  HOME EXERCISE PROGRAM: Access Code: [email or web address] URL: [email or web address] Date: 09/26/2022 Prepared by: Vernon Prey April Kirstie Peri  Exercises - Seated Cervical Retraction   - 1 x daily - 7 x weekly - 2 sets - 10 reps - Shoulder External Rotation and Scapular Retraction  - 1 x daily - 7 x weekly - 2 sets - 10 reps - Shoulder External Rotation in 45 Degrees Abduction  - 1 x daily - 7 x weekly - 2 sets - 10 reps - Standing Bilateral Low Shoulder Row with Anchored Resistance  - 1 x daily - 7 x weekly - 2 sets - 10 reps - Shoulder extension with resistance - Neutral  - 1 x daily - 7 x weekly - 2 sets - 10 reps - Supine Shoulder Flexion with Dowel  - 1 x daily - 7 x weekly - 1 sets - 5 reps - 10 sec hold - Sidelying Shoulder Abduction Palm Forward  - 1 x daily - 7 x weekly - 2 sets - 10 reps - Latissimus Dorsi Stretch at Wall (Mirrored)  - 1 x daily - 7 x weekly - 2 sets - 30 sec hold  ASSESSMENT:  CLINICAL IMPRESSION: Pt's shoulder is more loose today. Pt with increasing  strength against gravity. Continued to progress scapular strengthening with good tolerance.   OBJECTIVE IMPAIRMENTS: Abnormal gait, decreased activity tolerance, decreased balance, decreased endurance, decreased mobility, difficulty walking, decreased ROM, decreased strength, hypomobility, increased fascial restrictions, impaired flexibility, impaired sensation, impaired UE functional use, improper body mechanics, postural dysfunction, and pain.    GOALS: Goals reviewed with patient? Yes  SHORT TERM GOALS: Target date: 10/03/2022   Pt will be ind with initial HEP Baseline:  Goal status: MET  2.  Pt will have improved L shoulder abd to at least 120 deg for overhead tasks Baseline: 110 (09/24/22) 10/03/22: Supine ~130 deg (10/03/22), unable to obtain actively without increased pain Goal status: In progress   LONG TERM GOALS: Target date: 10/31/2022   Pt will be ind with management and progression of HEP Baseline:  Goal status: INITIAL  2.  Pt will have improved FOTO to >/=58 Baseline: 46 Goal status: INITIAL  3.  Pt will have improved grip strength to >/=50 lbs Baseline: 09/24/22 Goal  status: MET  4.  Pt will have improved L = R shoulder AROM without pain for all reaching tasks Baseline:  Goal status: INITIAL  5.  Pt will be able to demonstrate floor to stand transfer ind per pt's personal goals for yard work Baseline: working on sit to stand Goal status: INITIAL    PLAN:  PT FREQUENCY: 2x/week  PT DURATION: 8 weeks  PLANNED INTERVENTIONS: Therapeutic exercises, Therapeutic activity, Neuromuscular re-education, Balance training, Gait training, Patient/Family education, Self Care, Joint mobilization, Stair training, Aquatic Therapy, Dry Needling, Electrical stimulation, Cryotherapy, Moist heat, Taping, Vasopneumatic device, Ionotophoresis 4mg /ml Dexamethasone, Manual therapy, and Re-evaluation  PLAN FOR NEXT SESSION: Add to HEP as needed, work on UE strength, postural strength and incorporate core  Leeman Johnsey April Ma L Sher Hellinger, PT 10/06/22 8:01 AM

## 2022-10-10 ENCOUNTER — Ambulatory Visit: Payer: PPO | Admitting: Physical Therapy

## 2022-10-10 DIAGNOSIS — R293 Abnormal posture: Secondary | ICD-10-CM

## 2022-10-10 DIAGNOSIS — M542 Cervicalgia: Secondary | ICD-10-CM | POA: Diagnosis not present

## 2022-10-10 DIAGNOSIS — M6281 Muscle weakness (generalized): Secondary | ICD-10-CM

## 2022-10-10 DIAGNOSIS — R2689 Other abnormalities of gait and mobility: Secondary | ICD-10-CM

## 2022-10-10 NOTE — Therapy (Signed)
OUTPATIENT PHYSICAL THERAPY CERVICAL TREATMENT   Patient Name: Kevin Griffith MRN: [id number] DOB:July 28, 1950, 72 y.o., male Today's Date: 10/10/2022  END OF SESSION:  PT End of Session - 10/10/22 0755     Visit Number 10    Date for PT Re-Evaluation 10/31/22    Authorization Type [employer]    PT Start Time 0800    PT Stop Time 0840    PT Time Calculation (min) 40 min    Activity Tolerance Patient tolerated treatment well    Behavior During Therapy WFL for tasks assessed/performed               Past Medical History:  Diagnosis Date   Asymptomatic microscopic hematuria    Back pain, chronic    Cervical stenosis of spine    Colon polyps    DDD (degenerative disc disease), cervical    DDD (degenerative disc disease), lumbar    Drug rash    ED (erectile dysfunction)    Hyperbilirubinemia    Hypertriglyceridemia    IFG (impaired fasting glucose)    Insomnia disorder    Lactose intolerance    Mood change    MRSA (methicillin resistant Staphylococcus aureus)    Palpitations    Paroxysmal A-fib (HCC)    Skin problem    Sleep apnea    no cpap   Sleep apnea, obstructive    Vitamin D deficiency    Past Surgical History:  Procedure Laterality Date   ANTERIOR CERVICAL DECOMP/DISCECTOMY FUSION N/A 07/02/2022   Procedure: Anterior Cervical Decompression Fusion  - Cervical three-Cervical four - Cervical four-Cervical five - Cervical five-Cervical six;  Surgeon: Tia Alert, MD;  Location: [facility] OR;  Service: Neurosurgery;  Laterality: N/A;   APPENDECTOMY  1970   BACK SURGERY  1998, 07-2001   CARPAL TUNNEL RELEASE Left 07/02/2022   Procedure: Left carpal tunnel release;  Surgeon: Tia Alert, MD;  Location: [facility] OR;  Service: Neurosurgery;  Laterality: Left;   HEMIDISCECECTOMY     SHOULDER SURGERY Right    Patient Active Problem List   Diagnosis Date Noted   S/P cervical spinal fusion 07/02/2022    PCP: Rodrigo Ran, MD  REFERRING PROVIDER: Tia Alert,  MD  REFERRING DIAG:  [phone] (ICD-10-CM) - Spinal stenosis, cervical region    THERAPY DIAG:  Cervicalgia  Abnormal posture  Muscle weakness (generalized)  Other abnormalities of gait and mobility  Rationale for Evaluation and Treatment: Rehabilitation  ONSET DATE: 07/02/22 ACDF C3-4, 4-5, 5-6 with left carpal tunnel release  SUBJECTIVE:  SUBJECTIVE STATEMENT: Pt states his lumbar facet is giving him issues. Pt reports he did a lot of core work this week. Pt states he turned his neck to the left and got a "crick" in his neck. He reports he put ozonated sunflower oil on it and it helped take it away.  Hand dominance: Right  PERTINENT HISTORY:  L4-L5 fusion 2020, R RTC repair  PAIN:  Are you having pain? Yes: NPRS scale: 0 in neck, 2 or 3 at worst/10 Pain location: L mid deltoid Pain description: dull aching pain Aggravating factors: pushing Relieving factors: rest/careful movements  PRECAUTIONS: Other: pt reports Dr. Yetta Barre has not mentioned any precautions  WEIGHT BEARING RESTRICTIONS: No  FALLS:  Has patient fallen in last 6 months? No  LIVING ENVIRONMENT: Lives with: lives with their spouse Lives in: House/apartment Has following equipment at home: Single point cane  OCCUPATION: Retired  PLOF: Independent  PATIENT GOALS: Strengthen L shoulder, get up/down from floor without pulling up on something, reach down to tie his shoes, improve flexibility  NEXT MD VISIT: 11/25/22  OBJECTIVE: (Measures in this section from initial evaluation unless otherwise noted)  DIAGNOSTIC FINDINGS:  Cervical x-ray 07/02/22 IMPRESSION: Intraoperative images during C3-C6 ACDF. Hardware is intact. No evidence of immediate complication.  PATIENT SURVEYS:  FOTO 46; predicted  58  SENSATION: L phalanges 1-3 N/T  POSTURE: rounded shoulders and forward head, increased thoracic kyphosis  PALPATION: L thoracic paraspinal tenderness   CERVICAL ROM:   Active ROM A/PROM (deg) eval  Flexion 28  Extension 20  Right lateral flexion 20  Left lateral flexion 20  Right rotation 35  Left rotation 30   (Blank rows = not tested)  UPPER EXTREMITY ROM:  Active ROM Right eval Left eval Lt  09/24/22 Lt 10/03/22 Lt 10/10/22  Shoulder flexion 118 105 130 130 in sup 120 in sitting  Shoulder extension 60 60     Shoulder abduction 142 110* 110 120 in sup 140 in sitting  Shoulder adduction       Shoulder internal rotation L4 L5*     Shoulder external rotation T3 C7*     Elbow flexion       Elbow extension       Wrist flexion       Wrist extension       Wrist ulnar deviation       Wrist radial deviation       Wrist pronation       Wrist supination        (Blank rows = not tested) * = concordant pain  UPPER EXTREMITY MMT:  MMT Right eval Right 09/24/22 Left eval Left  09/24/22  Shoulder flexion 5  4   Shoulder extension 5  5   Shoulder abduction 5  4   Shoulder adduction      Shoulder internal rotation 5  5   Shoulder external rotation 4  4   Middle trapezius   3   Lower trapezius   3-   Elbow flexion      Elbow extension      Wrist flexion 4  4   Wrist extension 4  4   Wrist ulnar deviation      Wrist radial deviation      Wrist pronation 5  5   Wrist supination 5  4   Grip strength 60, 57, 60 80# 35, 30, 35 60#   (Blank rows = not tested)  CERVICAL SPECIAL TESTS:  Neck flexor muscle  endurance test: Negative  FUNCTIONAL TESTS:  Did not assess  TODAY'S TREATMENT:      DATE:  10/10/22 UBE L2-L3 6 min (3 fwd, 3 bwd) Seated thoracic extension against ball x10 Seated shoulder flexion 2# 2x10 Seated shoulder abd 1# x4, x10, x6 no weight Wall plank + scapular clock 2x10 1 foot on 6" step bent over row green TB 2x10 Shoulder ER at 60 deg abd  red TB 2x8 on L, 2x10 on R Shoulder IR at 60 deg abd red TB 2x8 on L, 2x10 on R  10/06/22 UBE L2 6 min (3 fwd, 3 bwd) Seated thoracic extension against ball x10 Seated shoulder ER red TB 2x10 Seated shoulder W red TB 2x10 Seated shoulder horizontal abd red TB 2x10 Wall plank + scapular clock 2x10 Wall push up plus x5 Standing rows green TB 2x10 Standing shoulder ext green TB 2x10 Standing shoulder abduction x10 Standing shoulder scaption x10 Standing shoulder flexion x10 (reports some crunching)  10/03/22 UBE L2 6 min (3 fwd, 3 bwd) Seated thoracic extension against ball x10 Standing doorway stretch low, mid x30 sec each Standing wall scaption AAROM x10 Supine shoulder flexion x5 S/L shoulder abd x5 S/L shoulder ER 3# 2x10 Manual therapy: STM & TPR L pecs, lats; grade II to III joint inferior and posterior shoulder mobs; shoulder PROM all directions Standing against pool noodle scap squeeze 10x5 sec Standing against pool noodle cervical retraction 10x5 sec                                                     PATIENT EDUCATION:  Education details: Exam findings, POC, initial HEP Person educated: Patient Education method: Explanation, Demonstration, and Handouts Education comprehension: verbalized understanding, returned demonstration, and needs further education  HOME EXERCISE PROGRAM: Access Code: [email or web address] URL: [email or web address] Date: 09/26/2022 Prepared by: Vernon Prey April Kirstie Peri  Exercises - Seated Cervical Retraction  - 1 x daily - 7 x weekly - 2 sets - 10 reps - Shoulder External Rotation and Scapular Retraction  - 1 x daily - 7 x weekly - 2 sets - 10 reps - Shoulder External Rotation in 45 Degrees Abduction  - 1 x daily - 7 x weekly - 2 sets - 10 reps - Standing Bilateral Low Shoulder Row with Anchored Resistance  - 1 x daily - 7 x weekly - 2 sets - 10 reps - Shoulder extension with resistance - Neutral  - 1 x daily - 7 x weekly - 2 sets - 10  reps - Supine Shoulder Flexion with Dowel  - 1 x daily - 7 x weekly - 1 sets - 5 reps - 10 sec hold - Sidelying Shoulder Abduction Palm Forward  - 1 x daily - 7 x weekly - 2 sets - 10 reps - Latissimus Dorsi Stretch at Wall (Mirrored)  - 1 x daily - 7 x weekly - 2 sets - 30 sec hold  ASSESSMENT:  CLINICAL IMPRESSION: Pt is demonstrating good gains in shoulder ROM -- it is 5 to 10 deg less than the right at this point. L shoulder remains weaker and becomes more fatigue -- session continues to focus on rotator cuff and scapular strengthening. Pt continues to make good progress towards his goals.   OBJECTIVE IMPAIRMENTS: Abnormal gait, decreased activity tolerance, decreased balance, decreased endurance, decreased mobility, difficulty walking, decreased  ROM, decreased strength, hypomobility, increased fascial restrictions, impaired flexibility, impaired sensation, impaired UE functional use, improper body mechanics, postural dysfunction, and pain.    GOALS: Goals reviewed with patient? Yes  SHORT TERM GOALS: Target date: 10/03/2022   Pt will be ind with initial HEP Baseline:  Goal status: MET  2.  Pt will have improved L shoulder abd to at least 120 deg for overhead tasks Baseline: 110 (09/24/22) 10/03/22: Supine ~130 deg (10/03/22), unable to obtain actively without increased pain Goal status: MET 10/10/22  LONG TERM GOALS: Target date: 10/31/2022   Pt will be ind with management and progression of HEP Baseline:  Goal status: INITIAL  2.  Pt will have improved FOTO to >/=58 Baseline: 46 Goal status: INITIAL  3.  Pt will have improved grip strength to >/=50 lbs Baseline: 09/24/22 Goal status: MET  4.  Pt will have improved L = R shoulder AROM without pain for all reaching tasks Baseline:  Goal status: INITIAL  5.  Pt will be able to demonstrate floor to stand transfer ind per pt's personal goals for yard work Baseline: working on sit to stand Goal status: INITIAL    PLAN:  PT  FREQUENCY: 2x/week  PT DURATION: 8 weeks  PLANNED INTERVENTIONS: Therapeutic exercises, Therapeutic activity, Neuromuscular re-education, Balance training, Gait training, Patient/Family education, Self Care, Joint mobilization, Stair training, Aquatic Therapy, Dry Needling, Electrical stimulation, Cryotherapy, Moist heat, Taping, Vasopneumatic device, Ionotophoresis 4mg /ml Dexamethasone, Manual therapy, and Re-evaluation  PLAN FOR NEXT SESSION: Add to HEP as needed, work on UE strength, postural strength and incorporate core  Sophi Calligan April Ma L Bralynn Velador, PT 10/10/22 7:55 AM

## 2022-10-13 ENCOUNTER — Ambulatory Visit: Payer: PPO | Admitting: Physical Therapy

## 2022-10-13 DIAGNOSIS — M542 Cervicalgia: Secondary | ICD-10-CM

## 2022-10-13 DIAGNOSIS — R293 Abnormal posture: Secondary | ICD-10-CM

## 2022-10-13 DIAGNOSIS — R2689 Other abnormalities of gait and mobility: Secondary | ICD-10-CM

## 2022-10-13 DIAGNOSIS — M6281 Muscle weakness (generalized): Secondary | ICD-10-CM

## 2022-10-13 NOTE — Therapy (Signed)
OUTPATIENT PHYSICAL THERAPY CERVICAL TREATMENT   Patient Name: Kevin Griffith MRN: [id number] DOB:05-23-1950, 72 y.o., male Today's Date: 10/13/2022  END OF SESSION:  PT End of Session - 10/13/22 0757     Visit Number 11    Date for PT Re-Evaluation 10/31/22    Authorization Type [employer]    PT Start Time 0800    PT Stop Time 0845    PT Time Calculation (min) 45 min    Activity Tolerance Patient tolerated treatment well    Behavior During Therapy WFL for tasks assessed/performed              Past Medical History:  Diagnosis Date   Asymptomatic microscopic hematuria    Back pain, chronic    Cervical stenosis of spine    Colon polyps    DDD (degenerative disc disease), cervical    DDD (degenerative disc disease), lumbar    Drug rash    ED (erectile dysfunction)    Hyperbilirubinemia    Hypertriglyceridemia    IFG (impaired fasting glucose)    Insomnia disorder    Lactose intolerance    Mood change    MRSA (methicillin resistant Staphylococcus aureus)    Palpitations    Paroxysmal A-fib (HCC)    Skin problem    Sleep apnea    no cpap   Sleep apnea, obstructive    Vitamin D deficiency    Past Surgical History:  Procedure Laterality Date   ANTERIOR CERVICAL DECOMP/DISCECTOMY FUSION N/A 07/02/2022   Procedure: Anterior Cervical Decompression Fusion  - Cervical three-Cervical four - Cervical four-Cervical five - Cervical five-Cervical six;  Surgeon: Tia Alert, MD;  Location: [facility] OR;  Service: Neurosurgery;  Laterality: N/A;   APPENDECTOMY  1970   BACK SURGERY  1998, 07-2001   CARPAL TUNNEL RELEASE Left 07/02/2022   Procedure: Left carpal tunnel release;  Surgeon: Tia Alert, MD;  Location: [facility] OR;  Service: Neurosurgery;  Laterality: Left;   HEMIDISCECECTOMY     SHOULDER SURGERY Right    Patient Active Problem List   Diagnosis Date Noted   S/P cervical spinal fusion 07/02/2022    PCP: Rodrigo Ran, MD  REFERRING PROVIDER: Tia Alert,  MD  REFERRING DIAG:  [phone] (ICD-10-CM) - Spinal stenosis, cervical region    THERAPY DIAG:  Cervicalgia  Abnormal posture  Muscle weakness (generalized)  Other abnormalities of gait and mobility  Rationale for Evaluation and Treatment: Rehabilitation  ONSET DATE: 07/02/22 ACDF C3-4, 4-5, 5-6 with left carpal tunnel release  SUBJECTIVE:  SUBJECTIVE STATEMENT: Pt states back is feeling better today. Pt reports he started oblique exercises which had helped. Has been doing pulleys at home. Pt reports he is noting increased R hand N/T. States he has been doing his wall planks with scapular clocks this morning.  Hand dominance: Right  PERTINENT HISTORY:  L4-L5 fusion 2020, R RTC repair  PAIN:  Are you having pain? Yes: NPRS scale: 0 in neck, 2 or 3 at worst/10 Pain location: L mid deltoid Pain description: dull aching pain Aggravating factors: pushing Relieving factors: rest/careful movements  PRECAUTIONS: Other: pt reports Dr. Yetta Barre has not mentioned any precautions  WEIGHT BEARING RESTRICTIONS: No  FALLS:  Has patient fallen in last 6 months? No  LIVING ENVIRONMENT: Lives with: lives with their spouse Lives in: House/apartment Has following equipment at home: Single point cane  OCCUPATION: Retired  PLOF: Independent  PATIENT GOALS: Strengthen L shoulder, get up/down from floor without pulling up on something, reach down to tie his shoes, improve flexibility  NEXT MD VISIT: 11/25/22  OBJECTIVE: (Measures in this section from initial evaluation unless otherwise noted)  DIAGNOSTIC FINDINGS:  Cervical x-ray 07/02/22 IMPRESSION: Intraoperative images during C3-C6 ACDF. Hardware is intact. No evidence of immediate complication.  PATIENT SURVEYS:  FOTO 46; predicted  58  SENSATION: L phalanges 1-3 N/T  POSTURE: rounded shoulders and forward head, increased thoracic kyphosis  PALPATION: L thoracic paraspinal tenderness   CERVICAL ROM:   Active ROM A/PROM (deg) eval  Flexion 28  Extension 20  Right lateral flexion 20  Left lateral flexion 20  Right rotation 35  Left rotation 30   (Blank rows = not tested)  UPPER EXTREMITY ROM:  Active ROM Right eval Left eval Lt  09/24/22 Lt 10/03/22 Lt 10/10/22  Shoulder flexion 118 105 130 130 in sup 120 in sitting  Shoulder extension 60 60     Shoulder abduction 142 110* 110 120 in sup 140 in sitting  Shoulder adduction       Shoulder internal rotation L4 L5*     Shoulder external rotation T3 C7*     Elbow flexion       Elbow extension       Wrist flexion       Wrist extension       Wrist ulnar deviation       Wrist radial deviation       Wrist pronation       Wrist supination        (Blank rows = not tested) * = concordant pain  UPPER EXTREMITY MMT:  MMT Right eval Right 09/24/22 Left eval Left  09/24/22  Shoulder flexion 5  4   Shoulder extension 5  5   Shoulder abduction 5  4   Shoulder adduction      Shoulder internal rotation 5  5   Shoulder external rotation 4  4   Middle trapezius   3   Lower trapezius   3-   Elbow flexion      Elbow extension      Wrist flexion 4  4   Wrist extension 4  4   Wrist ulnar deviation      Wrist radial deviation      Wrist pronation 5  5   Wrist supination 5  4   Grip strength 60, 57, 60 80# 35, 30, 35 60#   (Blank rows = not tested)  CERVICAL SPECIAL TESTS:  Neck flexor muscle endurance test: Negative  FUNCTIONAL TESTS:  Did not assess  TODAY'S TREATMENT:      DATE:  10/13/22 UBE L1-L5 6 min (3 fwd, 3 bwd) Seated thoracic extension against ball x10 Seated diagonals 2# ball 2x10 Seated "V" 2# ball 2x10 Seated shoulder abd 1# 2x8 Seated front delt raise 1# 2x10 Seated middle delt raise 1# x8, x10 Standing "W" red TB  2x10 Standing horizontal abd red TB 2x10 Standing doorway pec stretch x20 sec Standing bodyblade flexion 3x10 sec Standing bodyblade horizontal abd/add 3x10 sec Standing bodyblade scaption 3x10 sec   10/10/22 UBE L2-L3 6 min (3 fwd, 3 bwd) Seated thoracic extension against ball x10 Seated shoulder flexion 2# 2x10 Seated shoulder abd 1# x4, x10, x6 no weight Wall plank + scapular clock 2x10 1 foot on 6" step bent over row green TB 2x10 Shoulder ER at 60 deg abd red TB 2x8 on L, 2x10 on R Shoulder IR at 60 deg abd red TB 2x8 on L, 2x10 on R  10/06/22 UBE L2 6 min (3 fwd, 3 bwd) Seated thoracic extension against ball x10 Seated shoulder ER red TB 2x10 Seated shoulder W red TB 2x10 Seated shoulder horizontal abd red TB 2x10 Wall plank + scapular clock 2x10 Wall push up plus x5 Standing rows green TB 2x10 Standing shoulder ext green TB 2x10 Standing shoulder abduction x10 Standing shoulder scaption x10 Standing shoulder flexion x10 (reports some crunching)                                                      PATIENT EDUCATION:  Education details: Exam findings, POC, initial HEP Person educated: Patient Education method: Explanation, Demonstration, and Handouts Education comprehension: verbalized understanding, returned demonstration, and needs further education  HOME EXERCISE PROGRAM: Access Code: [email or web address] URL: [email or web address] Date: 09/26/2022 Prepared by: Vernon Prey April Kirstie Peri  Exercises - Seated Cervical Retraction  - 1 x daily - 7 x weekly - 2 sets - 10 reps - Shoulder External Rotation and Scapular Retraction  - 1 x daily - 7 x weekly - 2 sets - 10 reps - Shoulder External Rotation in 45 Degrees Abduction  - 1 x daily - 7 x weekly - 2 sets - 10 reps - Standing Bilateral Low Shoulder Row with Anchored Resistance  - 1 x daily - 7 x weekly - 2 sets - 10 reps - Shoulder extension with resistance - Neutral  - 1 x daily - 7 x weekly - 2 sets - 10  reps - Supine Shoulder Flexion with Dowel  - 1 x daily - 7 x weekly - 1 sets - 5 reps - 10 sec hold - Sidelying Shoulder Abduction Palm Forward  - 1 x daily - 7 x weekly - 2 sets - 10 reps - Latissimus Dorsi Stretch at Wall (Mirrored)  - 1 x daily - 7 x weekly - 2 sets - 30 sec hold  ASSESSMENT:  CLINICAL IMPRESSION: Continued core and shoulder strengthening. Initiated deltoid strengthening this session.   OBJECTIVE IMPAIRMENTS: Abnormal gait, decreased activity tolerance, decreased balance, decreased endurance, decreased mobility, difficulty walking, decreased ROM, decreased strength, hypomobility, increased fascial restrictions, impaired flexibility, impaired sensation, impaired UE functional use, improper body mechanics, postural dysfunction, and pain.    GOALS: Goals reviewed with patient? Yes  SHORT TERM GOALS: Target date: 10/03/2022   Pt will be ind with initial HEP Baseline:  Goal status: MET  2.  Pt will have improved L shoulder abd to at least 120 deg for overhead tasks Baseline: 110 (09/24/22) 10/03/22: Supine ~130 deg (10/03/22), unable to obtain actively without increased pain Goal status: MET 10/10/22  LONG TERM GOALS: Target date: 10/31/2022   Pt will be ind with management and progression of HEP Baseline:  Goal status: IN PROGRESS  2.  Pt will have improved FOTO to >/=58 Baseline: 46 Goal status: IN PROGRESS  3.  Pt will have improved grip strength to >/=50 lbs Baseline: 09/24/22 Goal status: MET  4.  Pt will have improved L = R shoulder AROM without pain for all reaching tasks Baseline:  Goal status: IN PROGRESS  5.  Pt will be able to demonstrate floor to stand transfer ind per pt's personal goals for yard work Baseline: working on sit to stand Goal status: IN PROGRESS    PLAN:  PT FREQUENCY: 2x/week  PT DURATION: 8 weeks  PLANNED INTERVENTIONS: Therapeutic exercises, Therapeutic activity, Neuromuscular re-education, Balance training, Gait training,  Patient/Family education, Self Care, Joint mobilization, Stair training, Aquatic Therapy, Dry Needling, Electrical stimulation, Cryotherapy, Moist heat, Taping, Vasopneumatic device, Ionotophoresis 4mg /ml Dexamethasone, Manual therapy, and Re-evaluation  PLAN FOR NEXT SESSION: Add to HEP as needed, work on UE strength, postural strength and incorporate core  Bina Veenstra April Ma L Vinayak Bobier, PT 10/13/22 7:58 AM

## 2022-10-17 ENCOUNTER — Ambulatory Visit: Payer: PPO | Admitting: Physical Therapy

## 2022-10-17 DIAGNOSIS — M542 Cervicalgia: Secondary | ICD-10-CM | POA: Diagnosis not present

## 2022-10-17 DIAGNOSIS — R293 Abnormal posture: Secondary | ICD-10-CM

## 2022-10-17 DIAGNOSIS — R2689 Other abnormalities of gait and mobility: Secondary | ICD-10-CM

## 2022-10-17 DIAGNOSIS — M6281 Muscle weakness (generalized): Secondary | ICD-10-CM

## 2022-10-17 NOTE — Therapy (Signed)
OUTPATIENT PHYSICAL THERAPY CERVICAL TREATMENT   Patient Name: Kevin Griffith MRN: [id number] DOB:June 19, 1950, 72 y.o., male Today's Date: 10/17/2022  END OF SESSION:  PT End of Session - 10/17/22 0758     Visit Number 12    Date for PT Re-Evaluation 10/31/22    Authorization Type [employer]    PT Start Time 0800    PT Stop Time 0840    PT Time Calculation (min) 40 min    Activity Tolerance Patient tolerated treatment well    Behavior During Therapy WFL for tasks assessed/performed             Past Medical History:  Diagnosis Date   Asymptomatic microscopic hematuria    Back pain, chronic    Cervical stenosis of spine    Colon polyps    DDD (degenerative disc disease), cervical    DDD (degenerative disc disease), lumbar    Drug rash    ED (erectile dysfunction)    Hyperbilirubinemia    Hypertriglyceridemia    IFG (impaired fasting glucose)    Insomnia disorder    Lactose intolerance    Mood change    MRSA (methicillin resistant Staphylococcus aureus)    Palpitations    Paroxysmal A-fib (HCC)    Skin problem    Sleep apnea    no cpap   Sleep apnea, obstructive    Vitamin D deficiency    Past Surgical History:  Procedure Laterality Date   ANTERIOR CERVICAL DECOMP/DISCECTOMY FUSION N/A 07/02/2022   Procedure: Anterior Cervical Decompression Fusion  - Cervical three-Cervical four - Cervical four-Cervical five - Cervical five-Cervical six;  Surgeon: Tia Alert, MD;  Location: [facility] OR;  Service: Neurosurgery;  Laterality: N/A;   APPENDECTOMY  1970   BACK SURGERY  1998, 07-2001   CARPAL TUNNEL RELEASE Left 07/02/2022   Procedure: Left carpal tunnel release;  Surgeon: Tia Alert, MD;  Location: [facility] OR;  Service: Neurosurgery;  Laterality: Left;   HEMIDISCECECTOMY     SHOULDER SURGERY Right    Patient Active Problem List   Diagnosis Date Noted   S/P cervical spinal fusion 07/02/2022    PCP: Rodrigo Ran, MD  REFERRING PROVIDER: Tia Alert,  MD  REFERRING DIAG:  [phone] (ICD-10-CM) - Spinal stenosis, cervical region    THERAPY DIAG:  Cervicalgia  Abnormal posture  Muscle weakness (generalized)  Other abnormalities of gait and mobility  Rationale for Evaluation and Treatment: Rehabilitation  ONSET DATE: 07/02/22 ACDF C3-4, 4-5, 5-6 with left carpal tunnel release  SUBJECTIVE:  SUBJECTIVE STATEMENT: Pt states he was not able to sleep too well last night. Pt reports his back started knotting up doing leg exercises and realized he was lacking protein.  Hand dominance: Right  PERTINENT HISTORY:  L4-L5 fusion 2020, R RTC repair  PAIN:  Are you having pain? Yes: NPRS scale: 0 in neck, 2 or 3 at worst/10 Pain location: L mid deltoid Pain description: dull aching pain Aggravating factors: pushing Relieving factors: rest/careful movements  PRECAUTIONS: Other: pt reports Dr. Yetta Barre has not mentioned any precautions  WEIGHT BEARING RESTRICTIONS: No  FALLS:  Has patient fallen in last 6 months? No  LIVING ENVIRONMENT: Lives with: lives with their spouse Lives in: House/apartment Has following equipment at home: Single point cane  OCCUPATION: Retired  PLOF: Independent  PATIENT GOALS: Strengthen L shoulder, get up/down from floor without pulling up on something, reach down to tie his shoes, improve flexibility  NEXT MD VISIT: 11/25/22  OBJECTIVE: (Measures in this section from initial evaluation unless otherwise noted)  DIAGNOSTIC FINDINGS:  Cervical x-ray 07/02/22 IMPRESSION: Intraoperative images during C3-C6 ACDF. Hardware is intact. No evidence of immediate complication.  PATIENT SURVEYS:  FOTO 46; predicted 58  SENSATION: L phalanges 1-3 N/T  POSTURE: rounded shoulders and forward head, increased thoracic  kyphosis  PALPATION: L thoracic paraspinal tenderness   CERVICAL ROM:   Active ROM A/PROM (deg) eval  Flexion 28  Extension 20  Right lateral flexion 20  Left lateral flexion 20  Right rotation 35  Left rotation 30   (Blank rows = not tested)  UPPER EXTREMITY ROM:  Active ROM Right eval Left eval Lt  09/24/22 Lt 10/03/22 Lt 10/10/22  Shoulder flexion 118 105 130 130 in sup 120 in sitting  Shoulder extension 60 60     Shoulder abduction 142 110* 110 120 in sup 140 in sitting  Shoulder adduction       Shoulder internal rotation L4 L5*     Shoulder external rotation T3 C7*     Elbow flexion       Elbow extension       Wrist flexion       Wrist extension       Wrist ulnar deviation       Wrist radial deviation       Wrist pronation       Wrist supination        (Blank rows = not tested) * = concordant pain  UPPER EXTREMITY MMT:  MMT Right eval Right 09/24/22 Left eval Left  09/24/22  Shoulder flexion 5  4   Shoulder extension 5  5   Shoulder abduction 5  4   Shoulder adduction      Shoulder internal rotation 5  5   Shoulder external rotation 4  4   Middle trapezius   3   Lower trapezius   3-   Elbow flexion      Elbow extension      Wrist flexion 4  4   Wrist extension 4  4   Wrist ulnar deviation      Wrist radial deviation      Wrist pronation 5  5   Wrist supination 5  4   Grip strength 60, 57, 60 80# 35, 30, 35 60#   (Blank rows = not tested)  CERVICAL SPECIAL TESTS:  Neck flexor muscle endurance test: Negative  FUNCTIONAL TESTS:  Did not assess  TODAY'S TREATMENT:      DATE: 10/17/22 UBE L5 x  6 min (3 fwd, 3 bwd) Seated thoracic extension against ball x 10 Standing against pool noodle Shoulder ER green TB 2x10 "W" green TB 2x10 Horizontal shoulder abd green TB 2x10 Snow angel 2x10 Seated Diagonals 2# 2x10 Middle delt raises no weight 2x10 D2 shoulder flexion 2x10 Standing Row 20# at cables x15 Shoulder ext 20# at cables  2x15 "Lawnmower" 10# at cables 2x10   10/13/22 UBE L1-L5 6 min (3 fwd, 3 bwd) Seated thoracic extension against ball x10 Seated diagonals 2# ball 2x10 Seated "V" 2# ball 2x10 Seated shoulder abd 1# 2x8 Seated front delt raise 1# 2x10 Seated middle delt raise 1# x8, x10 Standing "W" red TB 2x10 Standing horizontal abd red TB 2x10 Standing doorway pec stretch x20 sec Standing bodyblade flexion 3x10 sec Standing bodyblade horizontal abd/add 3x10 sec Standing bodyblade scaption 3x10 sec                                       PATIENT EDUCATION:  Education details: Exam findings, POC, initial HEP Person educated: Patient Education method: Explanation, Demonstration, and Handouts Education comprehension: verbalized understanding, returned demonstration, and needs further education  HOME EXERCISE PROGRAM: Access Code: [email or web address] URL: [email or web address] Date: 09/26/2022 Prepared by: Vernon Prey April Kirstie Peri  Exercises - Seated Cervical Retraction  - 1 x daily - 7 x weekly - 2 sets - 10 reps - Shoulder External Rotation and Scapular Retraction  - 1 x daily - 7 x weekly - 2 sets - 10 reps - Shoulder External Rotation in 45 Degrees Abduction  - 1 x daily - 7 x weekly - 2 sets - 10 reps - Standing Bilateral Low Shoulder Row with Anchored Resistance  - 1 x daily - 7 x weekly - 2 sets - 10 reps - Shoulder extension with resistance - Neutral  - 1 x daily - 7 x weekly - 2 sets - 10 reps - Supine Shoulder Flexion with Dowel  - 1 x daily - 7 x weekly - 1 sets - 5 reps - 10 sec hold - Sidelying Shoulder Abduction Palm Forward  - 1 x daily - 7 x weekly - 2 sets - 10 reps - Latissimus Dorsi Stretch at Wall (Mirrored)  - 1 x daily - 7 x weekly - 2 sets - 30 sec hold  ASSESSMENT:  CLINICAL IMPRESSION: Pt with increased lumbar pain this session. Continued to progress pt's strengthening. Now able to tolerate green TB.   OBJECTIVE IMPAIRMENTS: Abnormal gait, decreased activity  tolerance, decreased balance, decreased endurance, decreased mobility, difficulty walking, decreased ROM, decreased strength, hypomobility, increased fascial restrictions, impaired flexibility, impaired sensation, impaired UE functional use, improper body mechanics, postural dysfunction, and pain.    GOALS: Goals reviewed with patient? Yes  SHORT TERM GOALS: Target date: 10/03/2022   Pt will be ind with initial HEP Baseline:  Goal status: MET  2.  Pt will have improved L shoulder abd to at least 120 deg for overhead tasks Baseline: 110 (09/24/22) 10/03/22: Supine ~130 deg (10/03/22), unable to obtain actively without increased pain Goal status: MET 10/10/22  LONG TERM GOALS: Target date: 10/31/2022   Pt will be ind with management and progression of HEP Baseline:  Goal status: IN PROGRESS  2.  Pt will have improved FOTO to >/=58 Baseline: 46 Goal status: IN PROGRESS  3.  Pt will have improved grip strength to >/=50 lbs Baseline: 09/24/22 Goal status: MET  4.  Pt will have improved L = R shoulder AROM without pain for all reaching tasks Baseline:  Goal status: IN PROGRESS  5.  Pt will be able to demonstrate floor to stand transfer ind per pt's personal goals for yard work Baseline: working on sit to stand Goal status: IN PROGRESS    PLAN:  PT FREQUENCY: 2x/week  PT DURATION: 8 weeks  PLANNED INTERVENTIONS: Therapeutic exercises, Therapeutic activity, Neuromuscular re-education, Balance training, Gait training, Patient/Family education, Self Care, Joint mobilization, Stair training, Aquatic Therapy, Dry Needling, Electrical stimulation, Cryotherapy, Moist heat, Taping, Vasopneumatic device, Ionotophoresis 4mg /ml Dexamethasone, Manual therapy, and Re-evaluation  PLAN FOR NEXT SESSION: Add to HEP as needed, work on UE strength, postural strength and incorporate core  Lesbia Ottaway April Ma L Ayven Glasco, PT 10/17/22 7:58 AM

## 2022-10-20 ENCOUNTER — Ambulatory Visit: Payer: PPO

## 2022-10-20 DIAGNOSIS — M6281 Muscle weakness (generalized): Secondary | ICD-10-CM

## 2022-10-20 DIAGNOSIS — R293 Abnormal posture: Secondary | ICD-10-CM

## 2022-10-20 DIAGNOSIS — M542 Cervicalgia: Secondary | ICD-10-CM

## 2022-10-20 DIAGNOSIS — R2689 Other abnormalities of gait and mobility: Secondary | ICD-10-CM

## 2022-10-20 NOTE — Therapy (Signed)
OUTPATIENT PHYSICAL THERAPY CERVICAL TREATMENT   Patient Name: Kevin Griffith MRN: [id number] DOB:1950-08-04, 72 y.o., male Today's Date: 10/20/2022  END OF SESSION:  PT End of Session - 10/20/22 0837     Visit Number 13    Date for PT Re-Evaluation 10/31/22    Authorization Type [employer]    PT Start Time 0755    PT Stop Time 0837    PT Time Calculation (min) 42 min    Activity Tolerance Patient tolerated treatment well    Behavior During Therapy WFL for tasks assessed/performed              Past Medical History:  Diagnosis Date   Asymptomatic microscopic hematuria    Back pain, chronic    Cervical stenosis of spine    Colon polyps    DDD (degenerative disc disease), cervical    DDD (degenerative disc disease), lumbar    Drug rash    ED (erectile dysfunction)    Hyperbilirubinemia    Hypertriglyceridemia    IFG (impaired fasting glucose)    Insomnia disorder    Lactose intolerance    Mood change    MRSA (methicillin resistant Staphylococcus aureus)    Palpitations    Paroxysmal A-fib (HCC)    Skin problem    Sleep apnea    no cpap   Sleep apnea, obstructive    Vitamin D deficiency    Past Surgical History:  Procedure Laterality Date   ANTERIOR CERVICAL DECOMP/DISCECTOMY FUSION N/A 07/02/2022   Procedure: Anterior Cervical Decompression Fusion  - Cervical three-Cervical four - Cervical four-Cervical five - Cervical five-Cervical six;  Surgeon: Tia Alert, MD;  Location: [facility] OR;  Service: Neurosurgery;  Laterality: N/A;   APPENDECTOMY  1970   BACK SURGERY  1998, 07-2001   CARPAL TUNNEL RELEASE Left 07/02/2022   Procedure: Left carpal tunnel release;  Surgeon: Tia Alert, MD;  Location: [facility] OR;  Service: Neurosurgery;  Laterality: Left;   HEMIDISCECECTOMY     SHOULDER SURGERY Right    Patient Active Problem List   Diagnosis Date Noted   S/P cervical spinal fusion 07/02/2022    PCP: Rodrigo Ran, MD  REFERRING PROVIDER: Tia Alert,  MD  REFERRING DIAG:  [phone] (ICD-10-CM) - Spinal stenosis, cervical region    THERAPY DIAG:  Cervicalgia  Abnormal posture  Muscle weakness (generalized)  Other abnormalities of gait and mobility  Rationale for Evaluation and Treatment: Rehabilitation  ONSET DATE: 07/02/22 ACDF C3-4, 4-5, 5-6 with left carpal tunnel release  SUBJECTIVE:  SUBJECTIVE STATEMENT: I was able to lift my Lt arm and turn off the light in bed.  I worked my legs at [employer] and I'm sore now.   Hand dominance: Right  PERTINENT HISTORY:  L4-L5 fusion 2020, R RTC repair  PAIN:  Are you having pain? Yes: NPRS scale: 0 in neck, 2 or 3 at worst/10 Pain location: L mid deltoid Pain description: dull aching pain Aggravating factors: pushing Relieving factors: rest/careful movements  PRECAUTIONS: Other: pt reports Dr. Yetta Barre has not mentioned any precautions  WEIGHT BEARING RESTRICTIONS: No  FALLS:  Has patient fallen in last 6 months? No  LIVING ENVIRONMENT: Lives with: lives with their spouse Lives in: House/apartment Has following equipment at home: Single point cane  OCCUPATION: Retired  PLOF: Independent  PATIENT GOALS: Strengthen L shoulder, get up/down from floor without pulling up on something, reach down to tie his shoes, improve flexibility  NEXT MD VISIT: 11/25/22  OBJECTIVE: (Measures in this section from initial evaluation unless otherwise noted)  DIAGNOSTIC FINDINGS:  Cervical x-ray 07/02/22 IMPRESSION: Intraoperative images during C3-C6 ACDF. Hardware is intact. No evidence of immediate complication.  PATIENT SURVEYS:  FOTO 46; predicted 58 10/20/22: FOTO 55 (removed due to probable D/C in 2 sessions)  SENSATION: L phalanges 1-3 N/T  POSTURE: rounded shoulders and forward head,  increased thoracic kyphosis  PALPATION: L thoracic paraspinal tenderness   CERVICAL ROM:   Active ROM A/PROM (deg) eval A/ROM  10/20/22  Flexion 28 35  Extension 20   Right lateral flexion 20 20  Left lateral flexion 20 35  Right rotation 35 55  Left rotation 30 45   (Blank rows = not tested)  UPPER EXTREMITY ROM:  Active ROM Right eval Left eval Lt  09/24/22 Lt 10/03/22 Lt 10/10/22  Shoulder flexion 118 105 130 130 in sup 120 in sitting  Shoulder extension 60 60     Shoulder abduction 142 110* 110 120 in sup 140 in sitting  Shoulder adduction       Shoulder internal rotation L4 L5*     Shoulder external rotation T3 C7*     Elbow flexion       Elbow extension       Wrist flexion       Wrist extension       Wrist ulnar deviation       Wrist radial deviation       Wrist pronation       Wrist supination        (Blank rows = not tested) * = concordant pain  UPPER EXTREMITY MMT:  MMT Right eval Right 09/24/22 Left eval Left  09/24/22  Shoulder flexion 5  4   Shoulder extension 5  5   Shoulder abduction 5  4   Shoulder adduction      Shoulder internal rotation 5  5   Shoulder external rotation 4  4   Middle trapezius   3   Lower trapezius   3-   Elbow flexion      Elbow extension      Wrist flexion 4  4   Wrist extension 4  4   Wrist ulnar deviation      Wrist radial deviation      Wrist pronation 5  5   Wrist supination 5  4   Grip strength 60, 57, 60 80# 35, 30, 35 60#   (Blank rows = not tested)  CERVICAL SPECIAL TESTS:  Neck flexor muscle endurance test: Negative  FUNCTIONAL TESTS:  Did not assess  TODAY'S TREATMENT:      DATE: 10/20/22 UBE L5 x 6 min (3 fwd, 3 bwd) Seated thoracic extension against ball x 10 Standing against pool noodle Shoulder ER green TB 2x10 Horizontal shoulder abd green TB 2x10 Snow angel 2x10 Seated Diagonals 2# 2x10 Middle delt raises 2# 2x10 D2 shoulder flexion 2x10 Cervical A/ROM 3 ways: 2x20 seconds- measures  taken afer  Standing Row 20# at cables 2x15 Shoulder ext 20# at cables 2x15 Lawnmower: 20# at counter top 2x15   10/17/22 UBE L5 x 6 min (3 fwd, 3 bwd) Seated thoracic extension against ball x 10 Standing against pool noodle Shoulder ER green TB 2x10 "W" green TB 2x10 Horizontal shoulder abd green TB 2x10 Snow angel 2x10 Seated Diagonals 2# 2x10 Middle delt raises no weight 2x10 D2 shoulder flexion 2x10 Standing Row 20# at cables x15 Shoulder ext 20# at cables 2x15 "Lawnmower" 10# at cables 2x10   10/13/22 UBE L1-L5 6 min (3 fwd, 3 bwd) Seated thoracic extension against ball x10 Seated diagonals 2# ball 2x10 Seated "V" 2# ball 2x10 Seated shoulder abd 1# 2x8 Seated front delt raise 1# 2x10 Seated middle delt raise 1# x8, x10 Standing "W" red TB 2x10 Standing horizontal abd red TB 2x10 Standing doorway pec stretch x20 sec Standing bodyblade flexion 3x10 sec Standing bodyblade horizontal abd/add 3x10 sec Standing bodyblade scaption 3x10 sec                                       PATIENT EDUCATION:  Education details: Exam findings, POC, initial HEP Person educated: Patient Education method: Explanation, Demonstration, and Handouts Education comprehension: verbalized understanding, returned demonstration, and needs further education  HOME EXERCISE PROGRAM: Access Code: [email or web address] URL: [email or web address] Date: 09/26/2022 Prepared by: Vernon Prey April Kirstie Peri  Exercises - Seated Cervical Retraction  - 1 x daily - 7 x weekly - 2 sets - 10 reps - Shoulder External Rotation and Scapular Retraction  - 1 x daily - 7 x weekly - 2 sets - 10 reps - Shoulder External Rotation in 45 Degrees Abduction  - 1 x daily - 7 x weekly - 2 sets - 10 reps - Standing Bilateral Low Shoulder Row with Anchored Resistance  - 1 x daily - 7 x weekly - 2 sets - 10 reps - Shoulder extension with resistance - Neutral  - 1 x daily - 7 x weekly - 2 sets - 10 reps - Supine Shoulder  Flexion with Dowel  - 1 x daily - 7 x weekly - 1 sets - 5 reps - 10 sec hold - Sidelying Shoulder Abduction Palm Forward  - 1 x daily - 7 x weekly - 2 sets - 10 reps - Latissimus Dorsi Stretch at Wall (Mirrored)  - 1 x daily - 7 x weekly - 2 sets - 30 sec hold  ASSESSMENT:  CLINICAL IMPRESSION: Pt is able to progress consistently with upper body and cervical exercise.  FOTO is improved from 48 to 55, indicating improved function.  Cervical A/ROM is improved in all directions except Rt sidebending.  Pt remains active with regular exercise at the gym and will continue to benefit from this after discharge.  PT provided supervision and cueing throughout session.  Patient will benefit from skilled PT to address the below impairments and improve overall function.   OBJECTIVE IMPAIRMENTS: Abnormal gait, decreased activity tolerance, decreased  balance, decreased endurance, decreased mobility, difficulty walking, decreased ROM, decreased strength, hypomobility, increased fascial restrictions, impaired flexibility, impaired sensation, impaired UE functional use, improper body mechanics, postural dysfunction, and pain.    GOALS: Goals reviewed with patient? Yes  SHORT TERM GOALS: Target date: 10/03/2022   Pt will be ind with initial HEP Baseline:  Goal status: MET  2.  Pt will have improved L shoulder abd to at least 120 deg for overhead tasks Baseline: 110 (09/24/22) 10/03/22: Supine ~130 deg (10/03/22), unable to obtain actively without increased pain Goal status: MET 10/10/22  LONG TERM GOALS: Target date: 10/31/2022   Pt will be ind with management and progression of HEP Baseline:  Goal status: IN PROGRESS  2.  Pt will have improved FOTO to >/=58 Baseline: 55 (10/20/22) Goal status: IN PROGRESS  3.  Pt will have improved grip strength to >/=50 lbs Baseline: 09/24/22 Goal status: MET  4.  Pt will have improved L = R shoulder AROM without pain for all reaching tasks Baseline:  Goal status: IN  PROGRESS  5.  Pt will be able to demonstrate floor to stand transfer ind per pt's personal goals for yard work Baseline: working on sit to stand Goal status: IN PROGRESS    PLAN:  PT FREQUENCY: 2x/week  PT DURATION: 8 weeks  PLANNED INTERVENTIONS: Therapeutic exercises, Therapeutic activity, Neuromuscular re-education, Balance training, Gait training, Patient/Family education, Self Care, Joint mobilization, Stair training, Aquatic Therapy, Dry Needling, Electrical stimulation, Cryotherapy, Moist heat, Taping, Vasopneumatic device, Ionotophoresis 4mg /ml Dexamethasone, Manual therapy, and Re-evaluation  PLAN FOR NEXT SESSION: Probable D/C at the end of POC.  2 more sessions scheduled.  Work on floor to stand transfers to address LTG #5 Lorrene Reid, PT 10/20/22 8:50 AM

## 2022-10-24 ENCOUNTER — Ambulatory Visit: Payer: PPO | Admitting: Physical Therapy

## 2022-10-24 ENCOUNTER — Encounter: Payer: Self-pay | Admitting: Physical Therapy

## 2022-10-24 DIAGNOSIS — R2689 Other abnormalities of gait and mobility: Secondary | ICD-10-CM

## 2022-10-24 DIAGNOSIS — R293 Abnormal posture: Secondary | ICD-10-CM

## 2022-10-24 DIAGNOSIS — M542 Cervicalgia: Secondary | ICD-10-CM

## 2022-10-24 DIAGNOSIS — M6281 Muscle weakness (generalized): Secondary | ICD-10-CM

## 2022-10-24 NOTE — Therapy (Signed)
OUTPATIENT PHYSICAL THERAPY CERVICAL TREATMENT   Patient Name: Kevin Griffith MRN: [id number] DOB:[birth year], 72 y.o., male Today's Date: 10/24/2022  END OF SESSION:  PT End of Session - 10/24/22 0807     Visit Number 14    Date for PT Re-Evaluation 10/31/22    Authorization Type [employer]    PT Start Time 0802    PT Stop Time 0845    PT Time Calculation (min) 43 min    Activity Tolerance Patient tolerated treatment well    Behavior During Therapy WFL for tasks assessed/performed              Past Medical History:  Diagnosis Date   Asymptomatic microscopic hematuria    Back pain, chronic    Cervical stenosis of spine    Colon polyps    DDD (degenerative disc disease), cervical    DDD (degenerative disc disease), lumbar    Drug rash    ED (erectile dysfunction)    Hyperbilirubinemia    Hypertriglyceridemia    IFG (impaired fasting glucose)    Insomnia disorder    Lactose intolerance    Mood change    MRSA (methicillin resistant Staphylococcus aureus)    Palpitations    Paroxysmal A-fib (HCC)    Skin problem    Sleep apnea    no cpap   Sleep apnea, obstructive    Vitamin D deficiency    Past Surgical History:  Procedure Laterality Date   ANTERIOR CERVICAL DECOMP/DISCECTOMY FUSION N/A 07/02/2022   Procedure: Anterior Cervical Decompression Fusion  - Cervical three-Cervical four - Cervical four-Cervical five - Cervical five-Cervical six;  Surgeon: Tia Alert, MD;  Location: [facility] OR;  Service: Neurosurgery;  Laterality: N/A;   APPENDECTOMY  1970   BACK SURGERY  1998, 07-2001   CARPAL TUNNEL RELEASE Left 07/02/2022   Procedure: Left carpal tunnel release;  Surgeon: Tia Alert, MD;  Location: [facility] OR;  Service: Neurosurgery;  Laterality: Left;   HEMIDISCECECTOMY     SHOULDER SURGERY Right    Patient Active Problem List   Diagnosis Date Noted   S/P cervical spinal fusion 07/02/2022    PCP: Rodrigo Ran, MD  REFERRING PROVIDER: Tia Alert,  MD  REFERRING DIAG:  [phone] (ICD-10-CM) - Spinal stenosis, cervical region    THERAPY DIAG:  Cervicalgia  Abnormal posture  Muscle weakness (generalized)  Other abnormalities of gait and mobility  Rationale for Evaluation and Treatment: Rehabilitation  ONSET DATE: 07/02/22 ACDF C3-4, 4-5, 5-6 with left carpal tunnel release  SUBJECTIVE:  SUBJECTIVE STATEMENT: Pt states his facet has not been too bothersome after stopping his lat stretches. Reports some lower body and top of back soreness doing lifting/walking at [employer]. Pt notes some difficulty doing buttons/fine motor due to decreased sensation. Hand dominance: Right  PERTINENT HISTORY:  L4-L5 fusion 2020, R RTC repair  PAIN:  Are you having pain? Yes: NPRS scale: 0 in neck, 2 or 3 at worst/10 Pain location: L mid deltoid Pain description: dull aching pain Aggravating factors: pushing Relieving factors: rest/careful movements  PRECAUTIONS: Other: pt reports Dr. Yetta Barre has not mentioned any precautions  WEIGHT BEARING RESTRICTIONS: No  FALLS:  Has patient fallen in last 6 months? No  LIVING ENVIRONMENT: Lives with: lives with their spouse Lives in: House/apartment Has following equipment at home: Single point cane  OCCUPATION: Retired  PLOF: Independent  PATIENT GOALS: Strengthen L shoulder, get up/down from floor without pulling up on something, reach down to tie his shoes, improve flexibility  NEXT MD VISIT: 11/25/22  OBJECTIVE: (Measures in this section from initial evaluation unless otherwise noted)  DIAGNOSTIC FINDINGS:  Cervical x-ray 07/02/22 IMPRESSION: Intraoperative images during C3-C6 ACDF. Hardware is intact. No evidence of immediate complication.  PATIENT SURVEYS:  FOTO 46; predicted 58 10/20/22: FOTO  55 (removed due to probable D/C in 2 sessions)  SENSATION: L phalanges 1-3 N/T  POSTURE: rounded shoulders and forward head, increased thoracic kyphosis  PALPATION: L thoracic paraspinal tenderness   CERVICAL ROM:   Active ROM A/PROM (deg) eval A/ROM  10/20/22  Flexion 28 35  Extension 20   Right lateral flexion 20 20  Left lateral flexion 20 35  Right rotation 35 55  Left rotation 30 45   (Blank rows = not tested)  UPPER EXTREMITY ROM:  Active ROM Right eval Left eval Lt  09/24/22 Lt 10/03/22 Lt 10/10/22 Lt 10/24/22  Shoulder flexion 118 105 130 130 in sup 120 in sitting 120 in sitting, 140 in supine  Shoulder extension 60 60      Shoulder abduction 142 110* 110 120 in sup 140 in sitting 150 in sitting  Shoulder adduction        Shoulder internal rotation L4 L5*    L4*  Shoulder external rotation T3 C7*    C7*  Elbow flexion        Elbow extension        Wrist flexion        Wrist extension        Wrist ulnar deviation        Wrist radial deviation        Wrist pronation        Wrist supination         (Blank rows = not tested) * = concordant pain  UPPER EXTREMITY MMT:  MMT Right eval Right 09/24/22 Left eval Left  09/24/22  Shoulder flexion 5  4   Shoulder extension 5  5   Shoulder abduction 5  4   Shoulder adduction      Shoulder internal rotation 5  5   Shoulder external rotation 4  4   Middle trapezius   3   Lower trapezius   3-   Elbow flexion      Elbow extension      Wrist flexion 4  4   Wrist extension 4  4   Wrist ulnar deviation      Wrist radial deviation      Wrist pronation 5  5   Wrist supination 5  4   Grip strength 60, 57, 60 80# 35, 30, 35 60#   (Blank rows = not tested)  CERVICAL SPECIAL TESTS:  Neck flexor muscle endurance test: Negative  FUNCTIONAL TESTS:  Did not assess  TODAY'S TREATMENT:      DATE: 10/24/22 Pulleys flexion, scaption, horizontal abd x2 min each Seated thoracic extension against ball x10 Supine end  range flexion 2# 2x10 Supine D2 flexion 2# 2x10 Supine shoulder ER abd 90 deg 2# 2x10 Seated lat pull down 40# 2x10 Seated knee to chest x30 sec Therapeutic activity: floor to stand transfer with UE assist Discussed how to progress floor to stand without UE assist (pt is limited due to decreased hip flexion ROM)  10/20/22 UBE L5 x 6 min (3 fwd, 3 bwd) Seated thoracic extension against ball x 10 Standing against pool noodle Shoulder ER green TB 2x10 Horizontal shoulder abd green TB 2x10 Snow angel 2x10 Seated Diagonals 2# 2x10 Middle delt raises 2# 2x10 D2 shoulder flexion 2x10 Cervical A/ROM 3 ways: 2x20 seconds- measures taken afer  Standing Row 20# at cables 2x15 Shoulder ext 20# at cables 2x15 Lawnmower: 20# at counter top 2x15   10/17/22 UBE L5 x 6 min (3 fwd, 3 bwd) Seated thoracic extension against ball x 10 Standing against pool noodle Shoulder ER green TB 2x10 "W" green TB 2x10 Horizontal shoulder abd green TB 2x10 Snow angel 2x10 Seated Diagonals 2# 2x10 Middle delt raises no weight 2x10 D2 shoulder flexion 2x10 Standing Row 20# at cables x15 Shoulder ext 20# at cables 2x15 "Lawnmower" 10# at cables 2x10   PATIENT EDUCATION:  Education details: Exam findings, POC, initial HEP Person educated: Patient Education method: Explanation, Demonstration, and Handouts Education comprehension: verbalized understanding, returned demonstration, and needs further education  HOME EXERCISE PROGRAM: Access Code: [email or web address] URL: [email or web address] Date: 09/26/2022 Prepared by: Vernon Prey April Kirstie Peri  Exercises - Seated Cervical Retraction  - 1 x daily - 7 x weekly - 2 sets - 10 reps - Shoulder External Rotation and Scapular Retraction  - 1 x daily - 7 x weekly - 2 sets - 10 reps - Shoulder External Rotation in 45 Degrees Abduction  - 1 x daily - 7 x weekly - 2 sets - 10 reps - Standing Bilateral Low Shoulder Row with Anchored Resistance  - 1 x daily -  7 x weekly - 2 sets - 10 reps - Shoulder extension with resistance - Neutral  - 1 x daily - 7 x weekly - 2 sets - 10 reps - Supine Shoulder Flexion with Dowel  - 1 x daily - 7 x weekly - 1 sets - 5 reps - 10 sec hold - Sidelying Shoulder Abduction Palm Forward  - 1 x daily - 7 x weekly - 2 sets - 10 reps - Latissimus Dorsi Stretch at Wall (Mirrored)  - 1 x daily - 7 x weekly - 2 sets - 30 sec hold  ASSESSMENT:  CLINICAL IMPRESSION: Rechecked pt's shoulder ROM -- abduction has continued to improve. Flexion appears most limited in sitting but pt has full range in supine. Session focused on improving end range flexion strength and mobility. Performed floor to stand transfers with UE assist; however, pt would like to perform without UE. Floor transfers limited due to decreased hip flexibility and strength. Deferred performing quadruped exercises today due to fatigue. Pt is nearing d/c status for his UEs.    OBJECTIVE IMPAIRMENTS: Abnormal gait, decreased activity tolerance, decreased balance, decreased endurance, decreased mobility, difficulty walking, decreased  ROM, decreased strength, hypomobility, increased fascial restrictions, impaired flexibility, impaired sensation, impaired UE functional use, improper body mechanics, postural dysfunction, and pain.    GOALS: Goals reviewed with patient? Yes  SHORT TERM GOALS: Target date: 10/03/2022   Pt will be ind with initial HEP Baseline:  Goal status: MET  2.  Pt will have improved L shoulder abd to at least 120 deg for overhead tasks Baseline: 110 (09/24/22) 10/03/22: Supine ~130 deg (10/03/22), unable to obtain actively without increased pain Goal status: MET 10/10/22  LONG TERM GOALS: Target date: 10/31/2022   Pt will be ind with management and progression of HEP Baseline:  Goal status: IN PROGRESS  2.  Pt will have improved FOTO to >/=58 Baseline: 55 (10/20/22) Goal status: IN PROGRESS  3.  Pt will have improved grip strength to >/=50  lbs Baseline: 09/24/22 Goal status: MET  4.  Pt will have improved L = R shoulder AROM without pain for all reaching tasks Baseline:  Goal status: MET 10/24/22  5.  Pt will be able to demonstrate floor to stand transfer ind per pt's personal goals for yard work Baseline: working on sit to stand Goal status: IN PROGRESS    PLAN:  PT FREQUENCY: 2x/week  PT DURATION: 8 weeks  PLANNED INTERVENTIONS: Therapeutic exercises, Therapeutic activity, Neuromuscular re-education, Balance training, Gait training, Patient/Family education, Self Care, Joint mobilization, Stair training, Aquatic Therapy, Dry Needling, Electrical stimulation, Cryotherapy, Moist heat, Taping, Vasopneumatic device, Ionotophoresis 4mg /ml Dexamethasone, Manual therapy, and Re-evaluation  PLAN FOR NEXT SESSION: Probable D/C at the end of POC.  1 more sessions scheduled.  Work on floor to stand transfers to address LTG #5  [facility] April Dell Ponto, PT 10/24/22 8:07 AM

## 2022-10-28 ENCOUNTER — Ambulatory Visit: Payer: PPO | Attending: Neurological Surgery | Admitting: Physical Therapy

## 2022-10-28 DIAGNOSIS — R293 Abnormal posture: Secondary | ICD-10-CM | POA: Insufficient documentation

## 2022-10-28 DIAGNOSIS — M6281 Muscle weakness (generalized): Secondary | ICD-10-CM | POA: Diagnosis not present

## 2022-10-28 DIAGNOSIS — M542 Cervicalgia: Secondary | ICD-10-CM | POA: Insufficient documentation

## 2022-10-28 NOTE — Therapy (Addendum)
OUTPATIENT PHYSICAL THERAPY CERVICAL TREATMENT   Patient Name: Kevin Griffith MRN: [id number] DOB:23-Dec-1950, 72 y.o., male Today's Date: 10/28/2022  END OF SESSION:  PT End of Session - 10/28/22 0749     Visit Number 15    Date for PT Re-Evaluation 10/31/22    Authorization Type [employer]    PT Start Time 0755    PT Stop Time 0840    PT Time Calculation (min) 45 min    Activity Tolerance Patient tolerated treatment well              Past Medical History:  Diagnosis Date   Asymptomatic microscopic hematuria    Back pain, chronic    Cervical stenosis of spine    Colon polyps    DDD (degenerative disc disease), cervical    DDD (degenerative disc disease), lumbar    Drug rash    ED (erectile dysfunction)    Hyperbilirubinemia    Hypertriglyceridemia    IFG (impaired fasting glucose)    Insomnia disorder    Lactose intolerance    Mood change    MRSA (methicillin resistant Staphylococcus aureus)    Palpitations    Paroxysmal A-fib (HCC)    Skin problem    Sleep apnea    no cpap   Sleep apnea, obstructive    Vitamin D deficiency    Past Surgical History:  Procedure Laterality Date   ANTERIOR CERVICAL DECOMP/DISCECTOMY FUSION N/A 07/02/2022   Procedure: Anterior Cervical Decompression Fusion  - Cervical three-Cervical four - Cervical four-Cervical five - Cervical five-Cervical six;  Surgeon: Tia Alert, MD;  Location: [facility] OR;  Service: Neurosurgery;  Laterality: N/A;   APPENDECTOMY  1970   BACK SURGERY  1998, 07-2001   CARPAL TUNNEL RELEASE Left 07/02/2022   Procedure: Left carpal tunnel release;  Surgeon: Tia Alert, MD;  Location: [facility] OR;  Service: Neurosurgery;  Laterality: Left;   HEMIDISCECECTOMY     SHOULDER SURGERY Right    Patient Active Problem List   Diagnosis Date Noted   S/P cervical spinal fusion 07/02/2022    PCP: Rodrigo Ran, MD  REFERRING PROVIDER: Tia Alert, MD  REFERRING DIAG:  [phone] (ICD-10-CM) - Spinal stenosis,  cervical region    THERAPY DIAG:  Cervicalgia  Abnormal posture  Muscle weakness (generalized)  Rationale for Evaluation and Treatment: Rehabilitation  ONSET DATE: 07/02/22 ACDF C3-4, 4-5, 5-6 with left carpal tunnel release  SUBJECTIVE:  SUBJECTIVE STATEMENT: I've got a plan to do upper body one day, lower body the next day and then core the next.  I did legs yesterday at the gym.  I see Dr. Yetta Barre later in the month.  2 weeks ago left hand numbness and tingling improved.  I do have some right tingling now.  But when I do wrist curls that goes away.  I was able to get up off the floor no problem last night using my arms to pull up.     Hand dominance: Right  PERTINENT HISTORY:  L4-L5 fusion 2020, R RTC repair  PAIN:  Are you having pain? Yes: NPRS scale: 0 in neck, 2 or 3 at worst/10 Pain location: L mid deltoid Pain description: dull aching pain Aggravating factors: pushing Relieving factors: rest/careful movements  PRECAUTIONS: Other: pt reports Dr. Yetta Barre has not mentioned any precautions  WEIGHT BEARING RESTRICTIONS: No  FALLS:  Has patient fallen in last 6 months? No  LIVING ENVIRONMENT: Lives with: lives with their spouse Lives in: House/apartment Has following equipment at home: Single point cane  OCCUPATION: Retired  PLOF: Independent  PATIENT GOALS: Strengthen L shoulder, get up/down from floor without pulling up on something, reach down to tie his shoes, improve flexibility  NEXT MD VISIT: 11/25/22  OBJECTIVE: (Measures in this section from initial evaluation unless otherwise noted)  DIAGNOSTIC FINDINGS:  Cervical x-ray 07/02/22 IMPRESSION: Intraoperative images during C3-C6 ACDF. Hardware is intact. No evidence of immediate complication.  PATIENT SURVEYS:   FOTO 46; predicted 58 10/20/22: FOTO 55 (removed due to probable D/C in 2 sessions)  SENSATION: L phalanges 1-3 N/T  POSTURE: rounded shoulders and forward head, increased thoracic kyphosis  PALPATION: L thoracic paraspinal tenderness   CERVICAL ROM:   Active ROM A/PROM (deg) eval A/ROM  10/20/22  Flexion 28 35  Extension 20   Right lateral flexion 20 20  Left lateral flexion 20 35  Right rotation 35 55  Left rotation 30 45   (Blank rows = not tested)  UPPER EXTREMITY ROM:  Active ROM Right eval Left eval Lt  09/24/22 Lt 10/03/22 Lt 10/10/22 Lt 10/24/22  Shoulder flexion 118 105 130 130 in sup 120 in sitting 120 in sitting, 140 in supine  Shoulder extension 60 60      Shoulder abduction 142 110* 110 120 in sup 140 in sitting 150 in sitting  Shoulder adduction        Shoulder internal rotation L4 L5*    L4*  Shoulder external rotation T3 C7*    C7*  Elbow flexion        Elbow extension        Wrist flexion        Wrist extension        Wrist ulnar deviation        Wrist radial deviation        Wrist pronation        Wrist supination         (Blank rows = not tested) * = concordant pain  UPPER EXTREMITY MMT:  MMT Right eval Right 09/24/22 Left eval Left  09/24/22 7/2  Shoulder flexion 5  4  L   4+   Shoulder extension 5  5    Shoulder abduction 5  4  L 4+  Shoulder adduction       Shoulder internal rotation 5  5    Shoulder external rotation 4  4  L 4+  Middle trapezius  3  4-  Lower trapezius   3-  4-  Elbow flexion       Elbow extension       Wrist flexion 4  4  4+  Wrist extension 4  4  4+  Wrist ulnar deviation       Wrist radial deviation       Wrist pronation 5  5    Wrist supination 5  4  4+  Grip strength 60, 57, 60 80# 35, 30, 35 60# R83#L53#   (Blank rows = not tested)  CERVICAL SPECIAL TESTS:  Neck flexor muscle endurance test: Negative  FUNCTIONAL TESTS:  Did not assess  TODAY'S TREATMENT:      DATE: 10/28/22 UBE L5 5  min MMT Grip strength Supine end range flexion 2# 7 reps  Supine D2 flexion 2# 7 reps Supine shoulder ER abd 90 deg 2# 2x10 Seated front raise 2# 5x (a little irritable) Seated overhead press 2# 10x (feels good less soreness) Standing push press 4# 10x (feels even better) Bent row at the counter 20# kettlebell 2x10 right/left    10/24/22 Pulleys flexion, scaption, horizontal abd x2 min each Seated thoracic extension against ball x10 Supine end range flexion 2# 2x10 Supine D2 flexion 2# 2x10 Supine shoulder ER abd 90 deg 2# 2x10 Seated lat pull down 40# 2x10 Seated knee to chest x30 sec Therapeutic activity: floor to stand transfer with UE assist Discussed how to progress floor to stand without UE assist (pt is limited due to decreased hip flexion ROM)  10/20/22 UBE L5 x 6 min (3 fwd, 3 bwd) Seated thoracic extension against ball x 10 Standing against pool noodle Shoulder ER green TB 2x10 Horizontal shoulder abd green TB 2x10 Snow angel 2x10 Seated Diagonals 2# 2x10 Middle delt raises 2# 2x10 D2 shoulder flexion 2x10 Cervical A/ROM 3 ways: 2x20 seconds- measures taken afer  Standing Row 20# at cables 2x15 Shoulder ext 20# at cables 2x15 Lawnmower: 20# at counter top 2x15    PATIENT EDUCATION:  Education details: Exam findings, POC, initial HEP Person educated: Patient Education method: Explanation, Demonstration, and Handouts Education comprehension: verbalized understanding, returned demonstration, and needs further education  HOME EXERCISE PROGRAM: Access Code: [email or web address] URL: [email or web address] Date: 09/26/2022 Prepared by: Vernon Prey April Kirstie Peri  Exercises - Seated Cervical Retraction  - 1 x daily - 7 x weekly - 2 sets - 10 reps - Shoulder External Rotation and Scapular Retraction  - 1 x daily - 7 x weekly - 2 sets - 10 reps - Shoulder External Rotation in 45 Degrees Abduction  - 1 x daily - 7 x weekly - 2 sets - 10 reps - Standing  Bilateral Low Shoulder Row with Anchored Resistance  - 1 x daily - 7 x weekly - 2 sets - 10 reps - Shoulder extension with resistance - Neutral  - 1 x daily - 7 x weekly - 2 sets - 10 reps - Supine Shoulder Flexion with Dowel  - 1 x daily - 7 x weekly - 1 sets - 5 reps - 10 sec hold - Sidelying Shoulder Abduction Palm Forward  - 1 x daily - 7 x weekly - 2 sets - 10 reps - Latissimus Dorsi Stretch at Wall (Mirrored)  - 1 x daily - 7 x weekly - 2 sets - 30 sec hold  ASSESSMENT:  CLINICAL IMPRESSION: The patient has met the majority of rehab goals, with noted improvements in symptom reduction, ROM, strength and functional mobility.  He  has a plan for HEP/gym program that he would like to work on for the next 3 weeks on his own.  He will then follow up with the doctor.  He requests to be put on hold until that time and then he will call to determine readiness for discharge.  OBJECTIVE IMPAIRMENTS: Abnormal gait, decreased activity tolerance, decreased balance, decreased endurance, decreased mobility, difficulty walking, decreased ROM, decreased strength, hypomobility, increased fascial restrictions, impaired flexibility, impaired sensation, impaired UE functional use, improper body mechanics, postural dysfunction, and pain.   GOALS: Goals reviewed with patient? Yes  SHORT TERM GOALS: Target date: 10/03/2022   Pt will be ind with initial HEP Baseline:  Goal status: MET  2.  Pt will have improved L shoulder abd to at least 120 deg for overhead tasks Baseline: 110 (09/24/22) 10/03/22: Supine ~130 deg (10/03/22), unable to obtain actively without increased pain Goal status: MET 10/10/22  LONG TERM GOALS: Target date: 10/31/2022   Pt will be ind with management and progression of HEP Baseline:  Goal status: IN PROGRESS  2.  Pt will have improved FOTO to >/=58 Baseline: 55 (10/20/22) Goal status: IN PROGRESS  3.  Pt will have improved grip strength to >/=50 lbs Baseline: 09/24/22 Goal status:  MET  4.  Pt will have improved L = R shoulder AROM without pain for all reaching tasks Baseline:  Goal status: MET 10/24/22  5.  Pt will be able to demonstrate floor to stand transfer ind per pt's personal goals for yard work Baseline: working on sit to stand Goal status: met per pt report 7/2   PLAN:  PT FREQUENCY: 2x/week  PT DURATION: 8 weeks  PLANNED INTERVENTIONS: Therapeutic exercises, Therapeutic activity, Neuromuscular re-education, Balance training, Gait training, Patient/Family education, Self Care, Joint mobilization, Stair training, Aquatic Therapy, Dry Needling, Electrical stimulation, Cryotherapy, Moist heat, Taping, Vasopneumatic device, Ionotophoresis 4mg /ml Dexamethasone, Manual therapy, and Re-evaluation  PLAN FOR NEXT SESSION: pt requests to be put on hold until appt with Dr. Yetta Barre on 7/30; pt will call after that to follow up or be discharged  Lavinia Sharps, PT 10/28/22 8:40 AM Phone: [phone] Fax: [phone]  PHYSICAL THERAPY DISCHARGE SUMMARY  Visits from Start of Care: 15  Current functional level related to goals / functional outcomes: See above for most current PT status.  Pt didn't return to PT.    Remaining deficits: See above.   Education / Equipment: HEP   Patient agrees to discharge. Patient goals were partially met. Patient is being discharged due to not returning since the last visit.  Lorrene Reid, PT 05/28/23 3:30 PM

## 2022-11-25 DIAGNOSIS — G5603 Carpal tunnel syndrome, bilateral upper limbs: Secondary | ICD-10-CM | POA: Diagnosis not present

## 2022-11-25 DIAGNOSIS — M4802 Spinal stenosis, cervical region: Secondary | ICD-10-CM | POA: Diagnosis not present

## 2022-11-25 DIAGNOSIS — Z6830 Body mass index (BMI) 30.0-30.9, adult: Secondary | ICD-10-CM | POA: Diagnosis not present

## 2022-11-27 HISTORY — PX: CARPAL TUNNEL RELEASE: SHX101

## 2022-12-09 DIAGNOSIS — L237 Allergic contact dermatitis due to plants, except food: Secondary | ICD-10-CM | POA: Diagnosis not present

## 2022-12-10 DIAGNOSIS — G5601 Carpal tunnel syndrome, right upper limb: Secondary | ICD-10-CM | POA: Diagnosis not present

## 2022-12-18 DIAGNOSIS — L03114 Cellulitis of left upper limb: Secondary | ICD-10-CM | POA: Diagnosis not present

## 2023-01-20 DIAGNOSIS — E559 Vitamin D deficiency, unspecified: Secondary | ICD-10-CM | POA: Diagnosis not present

## 2023-01-20 DIAGNOSIS — E7849 Other hyperlipidemia: Secondary | ICD-10-CM | POA: Diagnosis not present

## 2023-01-20 DIAGNOSIS — E781 Pure hyperglyceridemia: Secondary | ICD-10-CM | POA: Diagnosis not present

## 2023-01-20 DIAGNOSIS — R7301 Impaired fasting glucose: Secondary | ICD-10-CM | POA: Diagnosis not present

## 2023-01-20 DIAGNOSIS — Z125 Encounter for screening for malignant neoplasm of prostate: Secondary | ICD-10-CM | POA: Diagnosis not present

## 2023-01-20 DIAGNOSIS — R7989 Other specified abnormal findings of blood chemistry: Secondary | ICD-10-CM | POA: Diagnosis not present

## 2023-01-20 DIAGNOSIS — Z1389 Encounter for screening for other disorder: Secondary | ICD-10-CM | POA: Diagnosis not present

## 2023-01-20 DIAGNOSIS — E785 Hyperlipidemia, unspecified: Secondary | ICD-10-CM | POA: Diagnosis not present

## 2023-01-20 DIAGNOSIS — Z1212 Encounter for screening for malignant neoplasm of rectum: Secondary | ICD-10-CM | POA: Diagnosis not present

## 2023-01-21 DIAGNOSIS — R946 Abnormal results of thyroid function studies: Secondary | ICD-10-CM | POA: Diagnosis not present

## 2023-01-26 DIAGNOSIS — E7849 Other hyperlipidemia: Secondary | ICD-10-CM | POA: Diagnosis not present

## 2023-01-26 DIAGNOSIS — Z1212 Encounter for screening for malignant neoplasm of rectum: Secondary | ICD-10-CM | POA: Diagnosis not present

## 2023-01-27 DIAGNOSIS — E559 Vitamin D deficiency, unspecified: Secondary | ICD-10-CM | POA: Diagnosis not present

## 2023-01-27 DIAGNOSIS — R82998 Other abnormal findings in urine: Secondary | ICD-10-CM | POA: Diagnosis not present

## 2023-01-27 DIAGNOSIS — Z0000 Encounter for general adult medical examination without abnormal findings: Secondary | ICD-10-CM | POA: Diagnosis not present

## 2023-01-27 DIAGNOSIS — I2510 Atherosclerotic heart disease of native coronary artery without angina pectoris: Secondary | ICD-10-CM | POA: Diagnosis not present

## 2023-01-27 DIAGNOSIS — E785 Hyperlipidemia, unspecified: Secondary | ICD-10-CM | POA: Diagnosis not present

## 2023-01-27 DIAGNOSIS — I700 Atherosclerosis of aorta: Secondary | ICD-10-CM | POA: Diagnosis not present

## 2023-01-27 DIAGNOSIS — E038 Other specified hypothyroidism: Secondary | ICD-10-CM | POA: Diagnosis not present

## 2023-01-27 DIAGNOSIS — G4700 Insomnia, unspecified: Secondary | ICD-10-CM | POA: Diagnosis not present

## 2023-01-27 DIAGNOSIS — Z23 Encounter for immunization: Secondary | ICD-10-CM | POA: Diagnosis not present

## 2023-01-27 DIAGNOSIS — R7301 Impaired fasting glucose: Secondary | ICD-10-CM | POA: Diagnosis not present

## 2023-01-27 DIAGNOSIS — N529 Male erectile dysfunction, unspecified: Secondary | ICD-10-CM | POA: Diagnosis not present

## 2023-01-27 DIAGNOSIS — E781 Pure hyperglyceridemia: Secondary | ICD-10-CM | POA: Diagnosis not present

## 2023-04-29 HISTORY — PX: EYE SURGERY: SHX253

## 2023-09-11 DIAGNOSIS — H43813 Vitreous degeneration, bilateral: Secondary | ICD-10-CM | POA: Diagnosis not present

## 2023-09-11 DIAGNOSIS — H5203 Hypermetropia, bilateral: Secondary | ICD-10-CM | POA: Diagnosis not present

## 2023-09-11 DIAGNOSIS — H524 Presbyopia: Secondary | ICD-10-CM | POA: Diagnosis not present

## 2023-09-11 DIAGNOSIS — H2513 Age-related nuclear cataract, bilateral: Secondary | ICD-10-CM | POA: Diagnosis not present

## 2023-09-14 DIAGNOSIS — R7301 Impaired fasting glucose: Secondary | ICD-10-CM | POA: Diagnosis not present

## 2023-09-14 DIAGNOSIS — E038 Other specified hypothyroidism: Secondary | ICD-10-CM | POA: Diagnosis not present

## 2023-12-14 DIAGNOSIS — M25552 Pain in left hip: Secondary | ICD-10-CM | POA: Diagnosis not present

## 2024-01-05 DIAGNOSIS — M25652 Stiffness of left hip, not elsewhere classified: Secondary | ICD-10-CM | POA: Diagnosis not present

## 2024-01-05 DIAGNOSIS — M25552 Pain in left hip: Secondary | ICD-10-CM | POA: Diagnosis not present

## 2024-01-19 DIAGNOSIS — M25552 Pain in left hip: Secondary | ICD-10-CM | POA: Diagnosis not present

## 2024-01-19 DIAGNOSIS — M25652 Stiffness of left hip, not elsewhere classified: Secondary | ICD-10-CM | POA: Diagnosis not present

## 2024-01-28 DIAGNOSIS — M25652 Stiffness of left hip, not elsewhere classified: Secondary | ICD-10-CM | POA: Diagnosis not present

## 2024-01-28 DIAGNOSIS — M25552 Pain in left hip: Secondary | ICD-10-CM | POA: Diagnosis not present

## 2024-02-26 DIAGNOSIS — M1612 Unilateral primary osteoarthritis, left hip: Secondary | ICD-10-CM | POA: Diagnosis not present

## 2024-02-29 ENCOUNTER — Telehealth (HOSPITAL_BASED_OUTPATIENT_CLINIC_OR_DEPARTMENT_OTHER): Payer: Self-pay

## 2024-02-29 NOTE — Telephone Encounter (Signed)
 Patient is scheduled with Dr. Kate. A note has been added to appointment line that pre op clearance is needed.

## 2024-02-29 NOTE — Telephone Encounter (Signed)
   Name:  JARVIN OGREN  DOB:  Apr 26, 1951  MRN:  [id number]   Primary Cardiologist: None  Chart reviewed as part of pre-operative protocol coverage. Patient was contacted 02/29/2024 in reference to pre-operative risk assessment for pending surgery as outlined below.  JAMARE VANATTA was last seen on 05/14/2018 by Dr. Fernande.    Due to time since last visit, NEHEMIAS SAUCEDA will require a follow-up visit for further pre-operative risk assessment. **Pt would not be appropriate for [facility].   Pre-op covering staff: - Please schedule appointment and call patient to inform them. If patient already had an upcoming appointment within acceptable timeframe, please add pre-op clearance to the appointment notes so provider is aware. - Please contact requesting surgeon's office via preferred method (i.e, phone, fax) to inform them of need for appointment prior to surgery.  Rosaline EMERSON Bane, NP-C 02/29/2024, 2:43 PM [address] [place], [place] [address] Office [phone] Fax [phone]

## 2024-02-29 NOTE — Telephone Encounter (Signed)
   Pre-operative Risk Assessment    Patient Name: Kevin Griffith  DOB: 1951/04/03 MRN: [id number]   Date of last office visit: 05/14/18 with Dr. Fernande Date of next office visit: 03/04/24 with Dr. Kate   Request for Surgical Clearance   Procedure:  Left total hip arthroplasty  Date of Surgery:  Clearance 04/18/24                                 Surgeon:  Dr. Dempsey Moan Surgeon's Group or Practice Name:  [facility] Phone number:  [phone] Fax number:  [phone]   Type of Clearance Requested:   - Medical    Type of Anesthesia:  Choice   Additional requests/questions:    SignedAugustin JONETTA Daring   02/29/2024, 2:10 PM  '

## 2024-03-03 NOTE — Progress Notes (Unsigned)
 Cardiology Office Note:    Date:  03/04/2024   ID:  ZAYVEON RASCHKE, DOB 1950/11/27, MRN [id number]  PCP:  Shayne Oneil, MD  Cardiologist:  Lonni LITTIE Nanas, MD  Electrophysiologist:  Elspeth Sage, MD (Inactive)   Referring MD: Shayne Oneil, MD   Chief Complaint  Patient presents with   Coronary Artery Disease    History of Present Illness:    Kevin Griffith is a 73 y.o. male with a hx of rheumatic fever, OSA, hyperlipidemia, paroxysmal atrial fibrillation, CAD who is referred by Dr. Shayne for preop evaluation.  Previously followed with Dr. Sage.  Last seen 04/2019.  Had history of paroxysmal atrial fibrillation but declined anticoagulation.  Also noted to have PVCs.    Calcium score 11/2021 was 585 (73 percentile).  Denies any chest pain, dyspnea, lightheadedness, syncope,or palpitations.  He monitors his heart rhythm with Kardia mobile device, has not noted further A-fib.  Has noted some swelling in his legs.  He can walk up flight of stairs without stopping, denies exertional symptoms.   Past Medical History:  Diagnosis Date   Asymptomatic microscopic hematuria    Back pain, chronic    Cervical stenosis of spine    Colon polyps    DDD (degenerative disc disease), cervical    DDD (degenerative disc disease), lumbar    Drug rash    ED (erectile dysfunction)    Hyperbilirubinemia    Hypertriglyceridemia    IFG (impaired fasting glucose)    Insomnia disorder    Lactose intolerance    Mood change    MRSA (methicillin resistant Staphylococcus aureus)    Palpitations    Paroxysmal A-fib (HCC)    Skin problem    Sleep apnea    no cpap   Sleep apnea, obstructive    Vitamin D  deficiency     Past Surgical History:  Procedure Laterality Date   ANTERIOR CERVICAL DECOMP/DISCECTOMY FUSION N/A 07/02/2022   Procedure: Anterior Cervical Decompression Fusion  - Cervical three-Cervical four - Cervical four-Cervical five - Cervical five-Cervical six;  Surgeon: Joshua Alm RAMAN,  MD;  Location: [facility] OR;  Service: Neurosurgery;  Laterality: N/A;   APPENDECTOMY  1970   BACK SURGERY  1998, 07-2001   CARPAL TUNNEL RELEASE Left 07/02/2022   Procedure: Left carpal tunnel release;  Surgeon: Joshua Alm RAMAN, MD;  Location: [facility] OR;  Service: Neurosurgery;  Laterality: Left;   HEMIDISCECECTOMY     SHOULDER SURGERY Right     Current Medications: Current Meds  Medication Sig   Ascorbic Acid  (VITAMIN C ) 1000 MG tablet Take 3,000 mg by mouth daily.   aspirin EC 81 MG tablet Take 1 tablet (81 mg total) by mouth daily. Swallow whole.   B Complex Vitamins (VITAMIN-B COMPLEX PO) Take 1 tablet by mouth daily.   Cholecalciferol (VITAMIN D3) 10000 units capsule Take 10,000 Units by mouth daily.   Cyanocobalamin  (B-12 PO) Take 1 tablet by mouth daily.   Digestive Enzymes (MULTI-ENZYME PO) Take 2 capsules by mouth daily before supper.   MAGNESIUM  GLYCINATE PO Take 600 mg by mouth at bedtime.   Menaquinone-7 (VITAMIN K2  PO) Take 500 mcg by mouth daily.   methocarbamol  (ROBAXIN ) 500 MG tablet Take 1 tablet (500 mg total) by mouth every 6 (six) hours as needed for muscle spasms.   Multiple Minerals (MINERAL COMPLEX PO) Take 3 tablets by mouth daily.    Omega-3 Fatty Acids (OMEGA 3 PO) Take 2 capsules by mouth daily.   oxyCODONE  (OXY IR/ROXICODONE ) 5 MG  immediate release tablet Take 1-2 tablets (5-10 mg total) by mouth every 6 (six) hours as needed for moderate pain ((score 4 to 6)).   Probiotic Product (PROBIOTIC PO) Take 1 capsule by mouth daily.   rosuvastatin (CRESTOR) 10 MG tablet Take 1 tablet (10 mg total) by mouth daily.   sildenafil (REVATIO) 20 MG tablet Take 40-60 mg by mouth daily as needed (ED).   traMADol (ULTRAM) 50 MG tablet Take 50 mg by mouth every 6 (six) hours as needed for moderate pain.   Vitamin A  10000 units TABS Take 10,000 Units by mouth daily.    vitamin E  400 UNIT capsule Take 400 Units by mouth 3 (three) times a week.   Current Facility-Administered Medications for  the 03/04/24 encounter (Office Visit) with Kate Lonni CROME, MD  Medication   0.9 %  sodium chloride  infusion     Allergies:   Acetaminophen , Ibuprofen, Tizanidine, and Sulfa antibiotics   Social History   Socioeconomic History   Marital status: Married    Spouse name: Kate   Number of children: 2   Years of education: Not on file   Highest education level: Not on file  Occupational History   Occupation: designer, industrial/product     Comment: agricultural engineer  Tobacco Use   Smoking status: Never   Smokeless tobacco: Never  Vaping Use   Vaping status: Never Used  Substance and Sexual Activity   Alcohol  use: Yes    Alcohol /week: 3.0 standard drinks of alcohol     Types: 3 Glasses of wine per week    Comment: occ wine or beer 2-3X/week   Drug use: No   Sexual activity: Yes    Partners: Female  Other Topics Concern   Not on file  Social History Narrative   Not on file   Social Drivers of Health   Financial Resource Strain: Not on file  Food Insecurity: Not on file  Transportation Needs: Not on file  Physical Activity: Not on file  Stress: Not on file  Social Connections: Not on file     Family History: The patient's family history includes Cerebral aneurysm in his mother; Crohn's disease in his brother and brother; Diabetes Mellitus II in his sister; Heart attack (age of onset: 98) in his sister; Heart disease in his sister; Hypertension in his mother. There is no history of Colon cancer, Colon polyps, Esophageal cancer, Stomach cancer, or Rectal cancer.  ROS:   Please see the history of present illness.     All other systems reviewed and are negative.  EKGs/Labs/Other Studies Reviewed:    The following studies were reviewed today:   EKG:   03/04/2024: Normal sinus rhythm, nonspecific T wave flattening, rate 75  Recent Labs: No results found for requested labs within last 365 days.  Recent Lipid Panel No results found for: CHOL, TRIG, HDL,  CHOLHDL, VLDL, LDLCALC, LDLDIRECT  Physical Exam:    VS:  BP (!) 159/74 (BP Location: Left Arm, Patient Position: Sitting, Cuff Size: Normal)   Pulse 75   Ht 6' (1.829 m)   Wt 240 lb (108.9 kg)   SpO2 95%   BMI 32.55 kg/m     Wt Readings from Last 3 Encounters:  03/04/24 240 lb (108.9 kg)  07/02/22 216 lb 7.9 oz (98.2 kg)  06/27/22 216 lb 11.2 oz (98.3 kg)     GEN:  Well nourished, well developed in no acute distress HEENT: Normal NECK: No JVD; No carotid bruits LYMPHATICS: No lymphadenopathy CARDIAC: RRR,  no murmurs, rubs, gallops RESPIRATORY:  Clear to auscultation without rales, wheezing or rhonchi  ABDOMEN: Soft, non-tender, non-distended MUSCULOSKELETAL:  No edema; No deformity  SKIN: Warm and dry NEUROLOGIC:  Alert and oriented x 3 PSYCHIATRIC:  Normal affect   ASSESSMENT:    1. Pre-op evaluation   2. Paroxysmal atrial fibrillation (HCC)   3. Elevated coronary artery calcium score   4. Hyperlipidemia, unspecified hyperlipidemia type    PLAN:    Preop evaluation: Prior to hip surgery.  He denies anginal symptoms.  Calcium score 585 on 11/2021, almost all in LAD.  Functional testing recommended for calcium score above 400, recommend Lexiscan Myoview prior to surgery.  CAD: Calcium score 11/2021 was 585 (73 percentile).  Denies anginal symptoms - Start aspirin 81 mg daily - Start rosuvastatin 10 mg daily.  Check fasting lipid panel in 3 months - Lexiscan Myoview recommended as above  Paroxysmal atrial fibrillation: Had episode in 03/2017.  Has declined anticoagulation.  He monitors with Kardia mobile device, has not noted further A-fib  Hyperlipidemia: LDL 100 on 01/20/2023.  Calcium score 585 on 11/2021 as above.  Starting rosuvastatin 10 mg daily.  Check fasting lipid panel in 3 months  OSA: previously on CPAP but reports no longer using  RTC in 4 months  Informed Consent   Shared Decision Making/Informed Consent The risks [chest pain, shortness of  breath, cardiac arrhythmias, dizziness, blood pressure fluctuations, myocardial infarction, stroke/transient ischemic attack, nausea, vomiting, allergic reaction, radiation exposure, metallic taste sensation and life-threatening complications (estimated to be 1 in 10,000)], benefits (risk stratification, diagnosing coronary artery disease, treatment guidance) and alternatives of a nuclear stress test were discussed in detail with Mr. Culbertson and he agrees to proceed.       Medication Adjustments/Labs and Tests Ordered: Current medicines are reviewed at length with the patient today.  Concerns regarding medicines are outlined above.  Orders Placed This Encounter  Procedures   Lipid Profile   Cardiac Stress Test: Informed Consent Details: Physician/Practitioner Attestation; Transcribe to consent form and obtain patient signature   Myocardial Perfusion Imaging   EKG 12-Lead   Meds ordered this encounter  Medications   aspirin EC 81 MG tablet    Sig: Take 1 tablet (81 mg total) by mouth daily. Swallow whole.   rosuvastatin (CRESTOR) 10 MG tablet    Sig: Take 1 tablet (10 mg total) by mouth daily.    Dispense:  90 tablet    Refill:  3    Patient Instructions  Medication Instructions:  Your physician has recommended you make the following change in your medication:  START: Aspirin 81 mg once daily  START: Crestor 10 mg once daily  *If you need a refill on your cardiac medications before your next appointment, please call your pharmacy*  Lab Work: In 3 months: Lipids If you have labs (blood work) drawn today and your tests are completely normal, you will receive your results only by: MyChart Message (if you have MyChart) OR A paper copy in the mail If you have any lab test that is abnormal or we need to change your treatment, we will call you to review the results.  Testing/Procedures: Your physician has requested that you have a Lexiscan Myoview.  The test will take approximately 3  to 4 hours to complete; you may bring reading material.  If someone comes with you to your appointment, they will need to remain in the main lobby due to limited space in the testing area. **If you  are pregnant or breastfeeding, please notify the nuclear lab prior to your appointment**  How to prepare for your Myocardial Perfusion Test: Do not eat or drink 3 hours prior to your test, except you may have water. Do not consume products containing caffeine (regular or decaffeinated) 12 hours prior to your test. (ex: coffee, chocolate, sodas, tea). Do bring a list of your current medications with you.  If not listed below, you may take your medications as normal. Do wear comfortable clothes (no dresses or overalls) and walking shoes, tennis shoes preferred (No heels or open toe shoes are allowed). Do NOT wear cologne, perfume, aftershave, or lotions (deodorant is allowed). If these instructions are not followed, your test will have to be rescheduled.   Follow-Up: At [facility], you and your health needs are our priority.  As part of our continuing mission to provide you with exceptional heart care, our providers are all part of one team.  This team includes your primary Cardiologist (physician) and Advanced Practice Providers or APPs (Physician Assistants and Nurse Practitioners) who all work together to provide you with the care you need, when you need it.  Your next appointment:   4 month(s)  Provider:   Lonni LITTIE Nanas, MD       Signed, Lonni LITTIE Nanas, MD  03/04/2024 3:50 PM    [facility]

## 2024-03-04 ENCOUNTER — Encounter: Payer: Self-pay | Admitting: Cardiology

## 2024-03-04 ENCOUNTER — Ambulatory Visit: Attending: Cardiology | Admitting: Cardiology

## 2024-03-04 VITALS — BP 159/74 | HR 75 | Ht 72.0 in | Wt 240.0 lb

## 2024-03-04 DIAGNOSIS — I480 Paroxysmal atrial fibrillation: Secondary | ICD-10-CM

## 2024-03-04 DIAGNOSIS — M1612 Unilateral primary osteoarthritis, left hip: Secondary | ICD-10-CM | POA: Diagnosis not present

## 2024-03-04 DIAGNOSIS — E785 Hyperlipidemia, unspecified: Secondary | ICD-10-CM

## 2024-03-04 DIAGNOSIS — Z01818 Encounter for other preprocedural examination: Secondary | ICD-10-CM | POA: Diagnosis not present

## 2024-03-04 DIAGNOSIS — R931 Abnormal findings on diagnostic imaging of heart and coronary circulation: Secondary | ICD-10-CM | POA: Diagnosis not present

## 2024-03-04 DIAGNOSIS — M25552 Pain in left hip: Secondary | ICD-10-CM | POA: Diagnosis not present

## 2024-03-04 DIAGNOSIS — M160 Bilateral primary osteoarthritis of hip: Secondary | ICD-10-CM | POA: Diagnosis not present

## 2024-03-04 DIAGNOSIS — M1611 Unilateral primary osteoarthritis, right hip: Secondary | ICD-10-CM | POA: Diagnosis not present

## 2024-03-04 MED ORDER — ASPIRIN 81 MG PO TBEC: 81.0000 mg | DELAYED_RELEASE_TABLET | Freq: Every day | ORAL | Status: DC

## 2024-03-04 MED ORDER — ROSUVASTATIN CALCIUM 10 MG PO TABS: 10.0000 mg | ORAL_TABLET | Freq: Every day | ORAL | 3 refills | Status: AC

## 2024-03-04 NOTE — Patient Instructions (Addendum)
 Medication Instructions:  Your physician has recommended you make the following change in your medication:  START: Aspirin 81 mg once daily  START: Crestor 10 mg once daily  *If you need a refill on your cardiac medications before your next appointment, please call your pharmacy*  Lab Work: In 3 months: Lipids If you have labs (blood work) drawn today and your tests are completely normal, you will receive your results only by: MyChart Message (if you have MyChart) OR A paper copy in the mail If you have any lab test that is abnormal or we need to change your treatment, we will call you to review the results.  Testing/Procedures: Your physician has requested that you have a Lexiscan Myoview.  The test will take approximately 3 to 4 hours to complete; you may bring reading material.  If someone comes with you to your appointment, they will need to remain in the main lobby due to limited space in the testing area. **If you are pregnant or breastfeeding, please notify the nuclear lab prior to your appointment**  How to prepare for your Myocardial Perfusion Test: Do not eat or drink 3 hours prior to your test, except you may have water. Do not consume products containing caffeine (regular or decaffeinated) 12 hours prior to your test. (ex: coffee, chocolate, sodas, tea). Do bring a list of your current medications with you.  If not listed below, you may take your medications as normal. Do wear comfortable clothes (no dresses or overalls) and walking shoes, tennis shoes preferred (No heels or open toe shoes are allowed). Do NOT wear cologne, perfume, aftershave, or lotions (deodorant is allowed). If these instructions are not followed, your test will have to be rescheduled.   Follow-Up: At [facility], you and your health needs are our priority.  As part of our continuing mission to provide you with exceptional heart care, our providers are all part of one team.  This team includes  your primary Cardiologist (physician) and Advanced Practice Providers or APPs (Physician Assistants and Nurse Practitioners) who all work together to provide you with the care you need, when you need it.  Your next appointment:   4 month(s)  Provider:   Lonni LITTIE Nanas, MD

## 2024-03-07 ENCOUNTER — Other Ambulatory Visit: Payer: Self-pay | Admitting: Cardiology

## 2024-03-07 DIAGNOSIS — R931 Abnormal findings on diagnostic imaging of heart and coronary circulation: Secondary | ICD-10-CM

## 2024-03-07 DIAGNOSIS — Z01818 Encounter for other preprocedural examination: Secondary | ICD-10-CM

## 2024-03-09 ENCOUNTER — Ambulatory Visit (HOSPITAL_COMMUNITY)
Admission: RE | Admit: 2024-03-09 | Discharge: 2024-03-09 | Disposition: A | Discharge status: Home / Self Care | Source: Ambulatory Visit | Attending: Internal Medicine | Admitting: Internal Medicine

## 2024-03-09 DIAGNOSIS — I700 Atherosclerosis of aorta: Secondary | ICD-10-CM | POA: Insufficient documentation

## 2024-03-09 DIAGNOSIS — Z01818 Encounter for other preprocedural examination: Secondary | ICD-10-CM | POA: Diagnosis not present

## 2024-03-09 DIAGNOSIS — I2510 Atherosclerotic heart disease of native coronary artery without angina pectoris: Secondary | ICD-10-CM | POA: Insufficient documentation

## 2024-03-09 DIAGNOSIS — R931 Abnormal findings on diagnostic imaging of heart and coronary circulation: Secondary | ICD-10-CM | POA: Diagnosis not present

## 2024-03-09 DIAGNOSIS — Z8249 Family history of ischemic heart disease and other diseases of the circulatory system: Secondary | ICD-10-CM | POA: Diagnosis not present

## 2024-03-09 LAB — MYOCARDIAL PERFUSION IMAGING
LV dias vol: 149 mL (ref 62–150)
LV sys vol: 81 mL (ref 4.2–5.8)
Nuc Stress EF: 46 %
Peak HR: 90 {beats}/min
Rest HR: 67 {beats}/min
Rest Nuclear Isotope Dose: 9.8 mCi
SDS: 0
SRS: 4
SSS: 1
ST Depression (mm): 0 mm
Stress Nuclear Isotope Dose: 31.5 mCi
TID: 1.13

## 2024-03-09 MED ORDER — REGADENOSON 0.4 MG/5ML IV SOLN
INTRAVENOUS | Status: AC
  Filled 2024-03-09: qty 5

## 2024-03-09 MED ORDER — TECHNETIUM TC 99M TETROFOSMIN IV KIT
31.5000 | PACK | Freq: Once | INTRAVENOUS | Status: AC | PRN
  Administered 2024-03-09: 31.5 via INTRAVENOUS

## 2024-03-09 MED ORDER — REGADENOSON 0.4 MG/5ML IV SOLN
0.4000 mg | Freq: Once | INTRAVENOUS | Status: AC
  Administered 2024-03-09: 0.4 mg via INTRAVENOUS

## 2024-03-09 MED ORDER — TECHNETIUM TC 99M TETROFOSMIN IV KIT
9.8000 | PACK | Freq: Once | INTRAVENOUS | Status: AC | PRN
  Administered 2024-03-09: 9.8 via INTRAVENOUS

## 2024-03-10 ENCOUNTER — Ambulatory Visit: Payer: Self-pay | Admitting: Cardiology

## 2024-03-14 ENCOUNTER — Encounter: Payer: Self-pay | Admitting: Cardiology

## 2024-03-14 DIAGNOSIS — I480 Paroxysmal atrial fibrillation: Secondary | ICD-10-CM

## 2024-03-14 DIAGNOSIS — R931 Abnormal findings on diagnostic imaging of heart and coronary circulation: Secondary | ICD-10-CM

## 2024-03-14 DIAGNOSIS — Z01818 Encounter for other preprocedural examination: Secondary | ICD-10-CM

## 2024-03-18 ENCOUNTER — Ambulatory Visit (HOSPITAL_COMMUNITY)
Admission: RE | Admit: 2024-03-18 | Discharge: 2024-03-18 | Disposition: A | Discharge status: Home / Self Care | Source: Ambulatory Visit | Attending: Cardiology | Admitting: Cardiology

## 2024-03-18 DIAGNOSIS — Z01818 Encounter for other preprocedural examination: Secondary | ICD-10-CM | POA: Diagnosis not present

## 2024-03-18 DIAGNOSIS — I480 Paroxysmal atrial fibrillation: Secondary | ICD-10-CM

## 2024-03-18 DIAGNOSIS — Z01810 Encounter for preprocedural cardiovascular examination: Secondary | ICD-10-CM | POA: Diagnosis not present

## 2024-03-18 DIAGNOSIS — R931 Abnormal findings on diagnostic imaging of heart and coronary circulation: Secondary | ICD-10-CM | POA: Diagnosis not present

## 2024-03-18 LAB — ECHOCARDIOGRAM COMPLETE
Area-P 1/2: 6.54 cm2
S' Lateral: 3.2 cm

## 2024-03-21 ENCOUNTER — Ambulatory Visit: Payer: Self-pay | Admitting: Cardiology

## 2024-03-22 NOTE — H&P (Cosign Needed Addendum)
 TOTAL HIP ADMISSION H&P  Patient is admitted for left total hip arthroplasty.  Subjective:  Chief Complaint: Left hip pain  HPI: Kevin Griffith, 73 y.o. male, has a history of pain and functional disability in the left hip due to arthritis and patient has failed non-surgical conservative treatments for greater than 12 weeks to include NSAID's and/or analgesics, use of assistive devices, weight reduction as appropriate, and activity modification. Onset of symptoms was gradual, starting several years ago with gradually worsening course since that time. The patient noted no past surgery on the left hip. Patient currently rates pain in the left hip at 8 out of 10 with activity. Patient has night pain, worsening of pain with activity and weight bearing, trendelenberg gait, pain that interfers with activities of daily living, and pain with passive range of motion. Patient has evidence of Radiographs of the pelvis and left hip demonstrate severe end-stage arthritis with complete bone-on-bone contact and massive osteophyte formation. The right hip shows arthritic changes with bone-on-bone contact, though less severe than the left. by imaging studies. This condition presents safety issues increasing the risk of falls. There is no current active infection.  Patient Active Problem List   Diagnosis Date Noted   S/P cervical spinal fusion 07/02/2022    Past Medical History:  Diagnosis Date   Asymptomatic microscopic hematuria    Back pain, chronic    Cervical stenosis of spine    Colon polyps    DDD (degenerative disc disease), cervical    DDD (degenerative disc disease), lumbar    Drug rash    ED (erectile dysfunction)    Hyperbilirubinemia    Hypertriglyceridemia    IFG (impaired fasting glucose)    Insomnia disorder    Lactose intolerance    Mood change    MRSA (methicillin resistant Staphylococcus aureus)    Palpitations    Paroxysmal A-fib (HCC)    Skin problem    Sleep apnea    no cpap    Sleep apnea, obstructive    Vitamin D  deficiency     Past Surgical History:  Procedure Laterality Date   ANTERIOR CERVICAL DECOMP/DISCECTOMY FUSION N/A 07/02/2022   Procedure: Anterior Cervical Decompression Fusion  - Cervical three-Cervical four - Cervical four-Cervical five - Cervical five-Cervical six;  Surgeon: Joshua Alm RAMAN, MD;  Location: [facility] OR;  Service: Neurosurgery;  Laterality: N/A;   APPENDECTOMY  1970   BACK SURGERY  1998, 07-2001   CARPAL TUNNEL RELEASE Left 07/02/2022   Procedure: Left carpal tunnel release;  Surgeon: Joshua Alm RAMAN, MD;  Location: [facility] OR;  Service: Neurosurgery;  Laterality: Left;   HEMIDISCECECTOMY     SHOULDER SURGERY Right     Prior to Admission medications   Medication Sig Start Date End Date Taking? Authorizing Provider  Ascorbic Acid  (VITAMIN C ) 1000 MG tablet Take 3,000 mg by mouth daily.    [provider]  aspirin  EC 81 MG tablet Take 1 tablet (81 mg total) by mouth daily. Swallow whole. 03/04/24   Kate Lonni CROME, MD  B Complex Vitamins (VITAMIN-B COMPLEX PO) Take 1 tablet by mouth daily.    [provider]  Cholecalciferol (VITAMIN D3) 10000 units capsule Take 10,000 Units by mouth daily.    [provider]  Cyanocobalamin  (B-12 PO) Take 1 tablet by mouth daily.    [provider]  Digestive Enzymes (MULTI-ENZYME PO) Take 2 capsules by mouth daily before supper.    [provider]  MAGNESIUM  GLYCINATE PO Take 600 mg  by mouth at bedtime.    [provider]  Menaquinone-7 (VITAMIN K2  PO) Take 500 mcg by mouth daily.    [provider]  methocarbamol  (ROBAXIN ) 500 MG tablet Take 1 tablet (500 mg total) by mouth every 6 (six) hours as needed for muscle spasms. 07/03/22   Joshua Alm Hamilton, MD  Multiple Minerals (MINERAL COMPLEX PO) Take 3 tablets by mouth daily.     [provider]  Omega-3 Fatty Acids (OMEGA 3 PO) Take 2 capsules by mouth daily.    [provider]   oxyCODONE  (OXY IR/ROXICODONE ) 5 MG immediate release tablet Take 1-2 tablets (5-10 mg total) by mouth every 6 (six) hours as needed for moderate pain ((score 4 to 6)). 07/03/22   Joshua Alm Hamilton, MD  Probiotic Product (PROBIOTIC PO) Take 1 capsule by mouth daily.    [provider]  rosuvastatin  (CRESTOR ) 10 MG tablet Take 1 tablet (10 mg total) by mouth daily. 03/04/24 06/02/24  Kate Lonni CROME, MD  sildenafil (REVATIO) 20 MG tablet Take 40-60 mg by mouth daily as needed (ED).    [provider]  traMADol (ULTRAM) 50 MG tablet Take 50 mg by mouth every 6 (six) hours as needed for moderate pain. 06/13/22   [provider]  Vitamin A  10000 units TABS Take 10,000 Units by mouth daily.     [provider]  vitamin E  400 UNIT capsule Take 400 Units by mouth 3 (three) times a week.    [provider]    Allergies  Allergen Reactions   Acetaminophen  Other (See Comments)    Increases blood pressure    Ibuprofen Other (See Comments)    Muscle cramping    Tizanidine     Urinary retention issues   Sulfa Antibiotics Rash    Social History   Socioeconomic History   Marital status: Married    Spouse name: Kate   Number of children: 2   Years of education: Not on file   Highest education level: Not on file  Occupational History   Occupation: designer, industrial/product     Comment: agricultural engineer  Tobacco Use   Smoking status: Never   Smokeless tobacco: Never  Vaping Use   Vaping status: Never Used  Substance and Sexual Activity   Alcohol  use: Yes    Alcohol /week: 3.0 standard drinks of alcohol     Types: 3 Glasses of wine per week    Comment: occ wine or beer 2-3X/week   Drug use: No   Sexual activity: Yes    Partners: Female  Other Topics Concern   Not on file  Social History Narrative   Not on file   Social Drivers of Health   Financial Resource Strain: Not on file  Food Insecurity: Not on file  Transportation Needs: Not on  file  Physical Activity: Not on file  Stress: Not on file  Social Connections: Not on file  Intimate Partner Violence: Not on file    Tobacco Use: Low Risk  (03/04/2024)   Patient History    Smoking Tobacco Use: Never    Smokeless Tobacco Use: Never    Passive Exposure: Not on file   Social History   Substance and Sexual Activity  Alcohol  Use Yes   Alcohol /week: 3.0 standard drinks of alcohol    Types: 3 Glasses of wine per week   Comment: occ wine or beer 2-3X/week    Family History  Problem Relation Age of Onset   Cerebral aneurysm Mother  Hypertension Mother    Heart attack Sister 90       oldest sister   Crohn's disease Brother    Diabetes Mellitus II Sister    Heart disease Sister    Crohn's disease Brother    Colon cancer Neg Hx    Colon polyps Neg Hx    Esophageal cancer Neg Hx    Stomach cancer Neg Hx    Rectal cancer Neg Hx     Review of Systems  Constitutional:  Negative for chills and fever.  Respiratory:  Negative for cough.   Cardiovascular:  Negative for chest pain.  Gastrointestinal:  Negative for abdominal pain.  Genitourinary:  Negative for dysuria.  Musculoskeletal:  Positive for joint pain.     Objective:  Physical Exam: Well-developed male alert and oriented in no apparent distress  Left hip: Flexion to 90 degrees, no internal or external rotation, no abduction.  Right hip: Flexion to 100 degrees, internal rotation to 10 degrees, external rotation to 20 degrees, abduction to 20 degrees without pain or range of motion limitation.  Gait: Significant antalgic gait pattern with cane use.  Imaging Review Radiographs of the pelvis and left hip demonstrate severe end-stage arthritis with complete bone-on-bone contact and massive osteophyte formation. The right hip shows arthritic changes with bone-on-bone contact, though less severe than the left.  Assessment/Plan:  End stage arthritis, left hip  The patient history, physical examination,  clinical judgement of the provider and imaging studies are consistent with end stage degenerative joint disease of the left hip and total hip arthroplasty is deemed medically necessary. The treatment options including medical management, injection therapy, arthroscopy and arthroplasty were discussed at length. The risks and benefits of total hip arthroplasty were presented and reviewed. The risks due to aseptic loosening, infection, stiffness, dislocation/subluxation, thromboembolic complications and other imponderables were discussed. The patient acknowledged the explanation, agreed to proceed with the plan and consent was signed. Patient is being admitted for inpatient treatment for surgery, pain control, PT, OT, prophylactic antibiotics, VTE prophylaxis, progressive ambulation and ADLs and discharge planning.The patient is planning to be discharged home.   Patient's anticipated LOS is less than 2 midnights, meeting these requirements: - Lives within 1 hour of care - Has a competent adult at home to recover with post-op recover - NO history of  - Chronic pain requiring opiods  - Diabetes  - Coronary Artery Disease  - Heart failure  - Heart attack  - Stroke  - DVT/VTE  - Renal failure  - Anemia  - Advanced Liver disease  Therapy Plans: HEP Disposition: home with wife  Planned DVT Prophylaxis: 81mg  Aspirin  BID  DME Needed: none PCP: Kevin Neth, MD (clearance pending* - Office visit 12/08) Cardiology: Lonni Nanas, MD (clearance pending - echo on 11/21) TXA: IV Allergies: ibuprofen (irregular heart rate), Sulfa drugs (rash), Tizanidine (urinary urgency)  Metal Allergies: none Anesthesia Concerns: hx of afib after shoulder never block BMI: 33.5  Last HgbA1c: not diabetic  [facility]: [facility] [address]  Pain regimen: Hydrocodone, Tramadol   Other: - clearance form given to patient   - Patient was instructed on what medications to stop prior to surgery. - Follow-up  visit in 2 weeks with Dr. Melodi - Begin physical therapy following surgery - Pre-operative lab work as pre-surgical testing - Prescriptions will be provided in hospital at time of discharge  Waddell Sor, PA-C Orthopedic Surgery [facility] [place]

## 2024-03-28 DIAGNOSIS — E038 Other specified hypothyroidism: Secondary | ICD-10-CM | POA: Diagnosis not present

## 2024-03-28 DIAGNOSIS — Z1212 Encounter for screening for malignant neoplasm of rectum: Secondary | ICD-10-CM | POA: Diagnosis not present

## 2024-03-28 DIAGNOSIS — E7849 Other hyperlipidemia: Secondary | ICD-10-CM | POA: Diagnosis not present

## 2024-04-03 DIAGNOSIS — E7849 Other hyperlipidemia: Secondary | ICD-10-CM | POA: Diagnosis not present

## 2024-04-04 DIAGNOSIS — R82998 Other abnormal findings in urine: Secondary | ICD-10-CM | POA: Diagnosis not present

## 2024-04-07 NOTE — Progress Notes (Signed)
 COVID Vaccine received:  []  No [x]  Yes Date of any COVID positive Test in last 90 days:  12-28-23 only  PCP - Oneil Neth, MD Clearance scanned to [facility] Cardiologist - Lonni Nanas, MD  EP- Carolan Sage, MD)   Chest x-ray - 07-30-2018  2v  Epic EKG -  03-04-2024  Epic Stress Test - 03-09-2024  Lexiscan   Epic ECHO - 03-18-2024  Epic Cardiac Cath -  CT Coronary Calcium  score: 585 on 12-16-2021  Epic  Pacemaker / ICD device [x]  No []  Yes   Spinal Cord Stimulator:[x]  No []  Yes       History of Sleep Apnea? [x]  No []  Yes   CPAP used?- [x]  No []  Yes    Medication on DOS: Tramadol   Patient has: [x]  NO Hx DM   []  Pre-DM   []  DM1  []   DM2 Does the patient monitor blood sugar?   []  N/A   [x]  No []  Yes  Last A1c was:  5.2  on 03-28-2024      Blood Thinner / Instructions: Aspirin  Instructions:  ASA 81 mg   Activity level: Able to walk up 2 flights of stairs without becoming significantly short of breath or having chest pain?  []  No   [x]    Yes  Patient can perform ADLs without assistance. []  No   [x]   Yes  Patient brought recent blood work from his PCP that was drawn on 04-05-24. Anesthesia review: A.fib, OSA- no CPAP, ACDF 07-02-2022  (C3-4,C4-5, C5-6)  Patient denies any S&S of respiratory illness or Covid - no shortness of breath, fever, cough or chest pain at PAT appointment.  Patient verbalized understanding and agreement to the Pre-Surgical Instructions that were given to them at this PAT appointment. Patient was also educated of the need to review these PAT instructions again prior to his surgery.I reviewed the appropriate phone numbers to call if they have any and questions or concerns.

## 2024-04-07 NOTE — Patient Instructions (Signed)
 SURGICAL WAITING ROOM VISITATION Patients having surgery or a procedure may have no more than 2 support people in the waiting area - these visitors may rotate in the visitor waiting room.   If the patient needs to stay at the hospital during part of their recovery, the visitor guidelines for inpatient rooms apply.  PRE-OP VISITATION  Pre-op nurse will coordinate an appropriate time for 1 support person to accompany the patient in pre-op.  This support person may not rotate.  This visitor will be contacted when the time is appropriate for the visitor to come back in the pre-op area.  Please refer to the [facility] website for the visitor guidelines for Inpatients (after your surgery is over and you are in a regular room).  Temporary Visitor Restrictions  Children ages 47 and under will not be able to visit patients in [facility] under most circumstances. Visitation is not restricted outside of hospitals unless noted otherwise in the [facility] and Location Specific Visitation Guidelines at :      [email or web address] Visitors with respiratory illnesses are discouraged from visiting and should remain at home.  You are not required to quarantine at this time prior to your surgery. However, you must do this: Hand Hygiene often Do NOT share personal items Notify your provider if you are in close contact with someone who has COVID or you develop fever 100.4 or greater, new onset of sneezing, cough, sore throat, shortness of breath or body aches.  If you test positive for Covid or have been in contact with anyone that has tested positive in the last 10 days please notify you surgeon.    Your procedure is scheduled on:  Monday  04-18-2024  Report to [facility] Main Entrance: [address] entrance where the [employer] is available.   Report to admitting at: 07:15    AM  Call this number if you have any questions or problems the morning of surgery [phone]  Do not eat food  after Midnight the night prior to your surgery/procedure.  After Midnight you may have the following liquids until  06:45 AM  DAY OF SURGERY  Clear Liquid Diet Water Black Coffee (sugar ok, NO MILK/CREAM OR CREAMERS)  Tea (sugar ok, NO MILK/CREAM OR CREAMERS) regular and decaf                             Plain Jell-O  with no fruit (NO RED)                                           Fruit ices (not with fruit pulp, NO RED)                                     Popsicles (NO RED)                                                                  Juice: NO CITRUS JUICES: only apple, WHITE grape, WHITE cranberry Sports drinks like Gatorade or Powerade (NO RED)  The day of surgery:  Drink ONE (1) Pre-Surgery Clear Ensure at  06:45  AM the morning of surgery. Drink in one sitting. Do not sip.  This drink was given to you during your hospital pre-op appointment visit. Nothing else to drink after completing the Pre-Surgery Clear Ensure : No candy, chewing gum or throat lozenges.    FOLLOW ANY ADDITIONAL PRE OP INSTRUCTIONS YOU RECEIVED FROM YOUR SURGEON'S OFFICE!!!   Oral Hygiene is also important to reduce your risk of infection.        Remember - BRUSH YOUR TEETH THE MORNING OF SURGERY WITH YOUR REGULAR TOOTHPASTE  Do NOT smoke after Midnight the night before surgery.  STOP TAKING all Vitamins, Herbs and supplements 1 week before your surgery.   Take ONLY these medicines the morning of surgery with A SIP OF WATER: Tramadol.                    You may not have any metal on your body including jewelry, and body piercing  Do not wear  lotions, powders, cologne, or deodorant  Men may shave face and neck.  Contacts, Hearing Aids, dentures or bridgework may not be worn into surgery. DENTURES WILL BE REMOVED PRIOR TO SURGERY PLEASE DO NOT APPLY Poly grip OR ADHESIVES!!!  You may bring a small overnight bag with you on the day of surgery, only pack items that are not  valuable. [facility] IS NOT RESPONSIBLE   FOR VALUABLES THAT ARE LOST OR STOLEN.   Do not bring your home medications to the hospital. The Pharmacy will dispense medications listed on your medication list to you during your admission in the Hospital.  Please read over the following fact sheets you were given: IF YOU HAVE QUESTIONS ABOUT YOUR PRE-OP INSTRUCTIONS, PLEASE CALL [phone].      Pre-operative 4 CHG Bath Instructions   You can play a key role in reducing the risk of infection after surgery. Your skin needs to be as free of germs as possible. You can reduce the number of germs on your skin by washing with CHG (chlorhexidine  gluconate) soap before surgery. CHG is an antiseptic soap that kills germs and continues to kill germs even after washing.   DO NOT use if you have an allergy to chlorhexidine /CHG or antibacterial soaps. If your skin becomes reddened or irritated, stop using the CHG and notify one of our RNs at [phone]  Please shower with the CHG soap starting 4 days before surgery using the following schedule: Thursday  04-14-2024     Do NOT use CHG soap                                                                                                                      the morning of your  surgery.         Please keep in mind the following:  DO NOT shave, including legs and underarms, starting the day of your first shower.   You may shave your face at any point before/day of surgery.  Place clean sheets on your bed the day you start using CHG soap. Use a clean washcloth (not used since being washed) for each shower. DO NOT sleep with pets once you start using the CHG.  CHG Shower Instructions:  If you choose to wash your hair and private area, wash first with your normal shampoo/soap.  After you use shampoo/soap, rinse your hair and body  thoroughly to remove shampoo/soap residue.  Turn the water OFF and apply about 3 tablespoons (45 ml) of CHG soap to a CLEAN washcloth.  Apply CHG soap ONLY FROM YOUR NECK DOWN TO YOUR TOES (washing for 3-5 minutes)  DO NOT use CHG soap on face, private areas, open wounds, or sores.  Pay special attention to the area where your surgery is being performed.  If you are having back surgery, having someone wash your back for you may be helpful. Wait 2 minutes after CHG soap is applied, then you may rinse off the CHG soap.  Pat dry with a clean towel  Put on clean clothes/pajamas   If you choose to wear lotion, please use ONLY the CHG-compatible lotions on the back of this paper.     Additional instructions for the day of surgery: DO NOT APPLY any CHG Soap,  lotions, deodorants, cologne, or perfumes on the day of surgery  Put on clean/comfortable clothes.  Brush your teeth.  Ask your nurse before applying any prescription medications to the skin.   CHG Compatible Lotions   Aveeno Moisturizing lotion  Cetaphil Moisturizing Cream  Cetaphil Moisturizing Lotion  Clairol Herbal Essence Moisturizing Lotion, Dry Skin  Clairol Herbal Essence Moisturizing Lotion, Extra Dry Skin  Clairol Herbal Essence Moisturizing Lotion, Normal Skin  Curel Age Defying Therapeutic Moisturizing Lotion with Alpha Hydroxy  Curel Extreme Care Body Lotion  Curel Soothing Hands Moisturizing Hand Lotion  Curel Therapeutic Moisturizing Cream, Fragrance-Free  Curel Therapeutic Moisturizing Lotion, Fragrance-Free  Curel Therapeutic Moisturizing Lotion, Original Formula  Eucerin Daily Replenishing Lotion  Eucerin Dry Skin Therapy Plus Alpha Hydroxy Crme  Eucerin Dry Skin Therapy Plus Alpha Hydroxy Lotion  Eucerin Original Crme  Eucerin Original Lotion  Eucerin Plus Crme Eucerin Plus Lotion  Eucerin TriLipid Replenishing Lotion  Keri Anti-Bacterial Hand Lotion  Keri Deep Conditioning Original Lotion Dry Skin Formula  Softly Scented  Keri Deep Conditioning Original Lotion, Fragrance Free Sensitive Skin Formula  Keri Lotion Fast Absorbing Fragrance Free Sensitive Skin Formula  Keri Lotion Fast Absorbing Softly Scented Dry Skin Formula  Keri Original Lotion  Keri Skin Renewal Lotion Keri Silky Smooth Lotion  Keri Silky Smooth Sensitive Skin Lotion  Nivea Body Creamy Conditioning Oil  Nivea Body Extra Enriched Lotion  Nivea Body Original Lotion  Nivea Body Sheer Moisturizing Lotion Nivea Crme  Nivea Skin Firming Lotion  NutraDerm 30 Skin Lotion  NutraDerm Skin Lotion  NutraDerm Therapeutic Skin Cream  NutraDerm Therapeutic Skin Lotion  ProShield Protective Hand Cream  Provon moisturizing lotion   FAILURE TO FOLLOW THESE INSTRUCTIONS MAY RESULT IN THE CANCELLATION OF YOUR SURGERY  PATIENT SIGNATURE_________________________________  NURSE SIGNATURE__________________________________  ________________________________________________________________________         Kevin Griffith    An incentive spirometer is a tool that can help keep your lungs clear and active. This tool  measures how well you are filling your lungs with each breath. Taking long deep breaths may help reverse or decrease the chance of developing breathing (pulmonary) problems (especially infection) following: A long period of time when you are unable to move or be active. BEFORE THE PROCEDURE  If the spirometer includes an indicator to show your best effort, your nurse or respiratory therapist will set it to a desired goal. If possible, sit up straight or lean slightly forward. Try not to slouch. Hold the incentive spirometer in an upright position. INSTRUCTIONS FOR USE  Sit on the edge of your bed if possible, or sit up as far as you can in bed or on a chair. Hold the incentive spirometer in an upright position. Breathe out normally. Place the mouthpiece in your mouth and seal your lips tightly around it. Breathe  in slowly and as deeply as possible, raising the piston or the ball toward the top of the column. Hold your breath for 3-5 seconds or for as long as possible. Allow the piston or ball to fall to the bottom of the column. Remove the mouthpiece from your mouth and breathe out normally. Rest for a few seconds and repeat Steps 1 through 7 at least 10 times every 1-2 hours when you are awake. Take your time and take a few normal breaths between deep breaths. The spirometer may include an indicator to show your best effort. Use the indicator as a goal to work toward during each repetition. After each set of 10 deep breaths, practice coughing to be sure your lungs are clear. If you have an incision (the cut made at the time of surgery), support your incision when coughing by placing a pillow or rolled up towels firmly against it. Once you are able to get out of bed, walk around indoors and cough well. You may stop using the incentive spirometer when instructed by your caregiver.  RISKS AND COMPLICATIONS Take your time so you do not get dizzy or light-headed. If you are in pain, you may need to take or ask for pain medication before doing incentive spirometry. It is harder to take a deep breath if you are having pain. AFTER USE Rest and breathe slowly and easily. It can be helpful to keep track of a log of your progress. Your caregiver can provide you with a simple table to help with this. If you are using the spirometer at home, follow these instructions: SEEK MEDICAL CARE IF:  You are having difficultly using the spirometer. You have trouble using the spirometer as often as instructed. Your pain medication is not giving enough relief while using the spirometer. You develop fever of 100.5 F (38.1 C) or higher.                                                                                                    SEEK IMMEDIATE MEDICAL CARE IF:  You cough up bloody sputum that had not been present  before. You develop fever of 102 F (38.9 C) or greater. You develop worsening pain at or near the incision  site. MAKE SURE YOU:  Understand these instructions. Will watch your condition. Will get help right away if you are not doing well or get worse. Document Released: 08/25/2006 Document Revised: 07/07/2011 Document Reviewed: 10/26/2006 [facility] Patient Information 2014 [place], [place].       WHAT IS A BLOOD TRANSFUSION? Blood Transfusion Information  A transfusion is the replacement of blood or some of its parts. Blood is made up of multiple cells which provide different functions. Red blood cells carry oxygen and are used for blood loss replacement. White blood cells fight against infection. Platelets control bleeding. Plasma helps clot blood. Other blood products are available for specialized needs, such as hemophilia or other clotting disorders. BEFORE THE TRANSFUSION  Who gives blood for transfusions?  Healthy volunteers who are fully evaluated to make sure their blood is safe. This is blood bank blood. Transfusion therapy is the safest it has ever been in the practice of medicine. Before blood is taken from a donor, a complete history is taken to make sure that person has no history of diseases nor engages in risky social behavior (examples are intravenous drug use or sexual activity with multiple partners). The donor's travel history is screened to minimize risk of transmitting infections, such as malaria. The donated blood is tested for signs of infectious diseases, such as HIV and hepatitis. The blood is then tested to be sure it is compatible with you in order to minimize the chance of a transfusion reaction. If you or a relative donates blood, this is often done in anticipation of surgery and is not appropriate for emergency situations. It takes many days to process the donated blood. RISKS AND COMPLICATIONS Although transfusion therapy is very safe and saves many lives,  the main dangers of transfusion include:  Getting an infectious disease. Developing a transfusion reaction. This is an allergic reaction to something in the blood you were given. Every precaution is taken to prevent this. The decision to have a blood transfusion has been considered carefully by your caregiver before blood is given. Blood is not given unless the benefits outweigh the risks. AFTER THE TRANSFUSION Right after receiving a blood transfusion, you will usually feel much better and more energetic. This is especially true if your red blood cells have gotten low (anemic). The transfusion raises the level of the red blood cells which carry oxygen, and this usually causes an energy increase. The nurse administering the transfusion will monitor you carefully for complications. HOME CARE INSTRUCTIONS  No special instructions are needed after a transfusion. You may find your energy is better. Speak with your caregiver about any limitations on activity for underlying diseases you may have. SEEK MEDICAL CARE IF:  Your condition is not improving after your transfusion. You develop redness or irritation at the intravenous (IV) site. SEEK IMMEDIATE MEDICAL CARE IF:  Any of the following symptoms occur over the next 12 hours: Shaking chills. You have a temperature by mouth above 102 F (38.9 C), not controlled by medicine. Chest, back, or muscle pain. People around you feel you are not acting correctly or are confused. Shortness of breath or difficulty breathing. Dizziness and fainting. You get a rash or develop hives. You have a decrease in urine output. Your urine turns a dark color or changes to pink, red, or brown. Any of the following symptoms occur over the next 10 days: You have a temperature by mouth above 102 F (38.9 C), not controlled by medicine. Shortness of breath. Weakness after normal activity.  The white part of the eye turns yellow (jaundice). You have a decrease in the  amount of urine or are urinating less often. Your urine turns a dark color or changes to pink, red, or brown. Document Released: 04/11/2000 Document Revised: 07/07/2011 Document Reviewed: 11/29/2007 [facility] Patient Information 2014 [employer], [place].  _______________________________________________________________________         If you would like to see a video about joint replacement:   [email or web address]

## 2024-04-08 ENCOUNTER — Other Ambulatory Visit: Payer: Self-pay

## 2024-04-08 ENCOUNTER — Encounter (HOSPITAL_COMMUNITY): Payer: Self-pay

## 2024-04-08 ENCOUNTER — Encounter (HOSPITAL_COMMUNITY)
Admission: RE | Admit: 2024-04-08 | Discharge: 2024-04-08 | Disposition: A | Source: Ambulatory Visit | Attending: Orthopedic Surgery

## 2024-04-08 VITALS — BP 146/80 | HR 84 | Temp 98.1°F | Resp 16 | Ht 72.0 in | Wt 240.0 lb

## 2024-04-08 DIAGNOSIS — I2510 Atherosclerotic heart disease of native coronary artery without angina pectoris: Secondary | ICD-10-CM

## 2024-04-08 DIAGNOSIS — Z01818 Encounter for other preprocedural examination: Secondary | ICD-10-CM

## 2024-04-08 DIAGNOSIS — Z8614 Personal history of Methicillin resistant Staphylococcus aureus infection: Secondary | ICD-10-CM

## 2024-04-08 DIAGNOSIS — M1612 Unilateral primary osteoarthritis, left hip: Secondary | ICD-10-CM

## 2024-04-08 HISTORY — DX: Unspecified osteoarthritis, unspecified site: M19.90

## 2024-04-08 HISTORY — DX: Cardiac arrhythmia, unspecified: I49.9

## 2024-04-08 LAB — SURGICAL PCR SCREEN
MRSA, PCR: NEGATIVE
Staphylococcus aureus: NEGATIVE

## 2024-04-12 NOTE — Anesthesia Preprocedure Evaluation (Signed)
 Anesthesia Evaluation    Airway        Dental   Pulmonary           Cardiovascular      Neuro/Psych    GI/Hepatic   Endo/Other    Renal/GU      Musculoskeletal   Abdominal   Peds  Hematology   Anesthesia Other Findings   Reproductive/Obstetrics                              Anesthesia Physical Anesthesia Plan  ASA:   Anesthesia Plan:    Post-op Pain Management:    Induction:   PONV Risk Score and Plan:   Airway Management Planned:   Additional Equipment:   Intra-op Plan:   Post-operative Plan:   Informed Consent:   Plan Discussed with:   Anesthesia Plan Comments: (PAT note by Kevin Hope, PA-C: 73 year old male follows with cardiology for history of OSA not on CPAP, HLD, elevated coronary artery calcium  score (585, 73rd percentile), PACs and PVCs as well as an episode of perioperative atrial fibrillation that occurred during/after shoulder surgery in 2018.  Echo 02/2018 showed normal biventricular function, normal valves.  He was seen by Dr. Kate on 03/04/2024 for preop evaluation.  Per note, Preop evaluation: Prior to hip surgery.  He denies anginal symptoms.  Calcium  score 585 on 11/2021, almost all in LAD.  Functional testing recommended for calcium  score above 400, recommend Lexiscan  Myoview  prior to surgery.  Nuclear stress test 03/09/2024 showed normal perfusion but was intermediate risk due to depressed EF 46%.  Echo 03/18/2024 showed LVEF 55 to 60%, hypokinetic septum, grade 1 DD, normal RV, mild dilatation of ascending aorta 42 mm.  Dr. Kate commented on results stating, Heart pumping function is normal.  Aorta is dilated, will monitor.  Otherwise no significant abnormalities. No further workup recommended prior to his surgery next month.   Clearance from PCP Dr. Mathews dated 04/04/2024 states patient cleared to proceed at low risk.  CMP and CBC 03/28/2024 in Care  Everywhere reviewed, WNL.  EKG 03/04/2024: NSR.  Rate 75.  Minimal voltage criteria for LVH, may be normal variant.  Nonspecific T wave abnormality.   TTE 03/18/2024:  1. Mild septal hypokinesis. Left ventricular ejection fraction, by  estimation, is 55 to 60%. Left ventricular ejection fraction by 3D volume  is 56 %. The left ventricle has normal function. The left ventricle  demonstrates regional wall motion  abnormalities (see scoring diagram/findings for description). There is  mild concentric left ventricular hypertrophy. Left ventricular diastolic  parameters are consistent with Grade I diastolic dysfunction (impaired  relaxation). The average left  ventricular global longitudinal strain is -20.7 %. The global longitudinal  strain is normal.   2. Right ventricular systolic function is normal. The right ventricular  size is normal. There is normal pulmonary artery systolic pressure.   3. The mitral valve is normal in structure. Trivial mitral valve  regurgitation. No evidence of mitral stenosis.   4. The aortic valve is tricuspid. Aortic valve regurgitation is not  visualized. No aortic stenosis is present.   5. Aortic dilatation noted. There is mild dilatation of the ascending  aorta, measuring 42 mm.   6. The inferior vena cava is normal in size with greater than 50%  respiratory variability, suggesting right atrial pressure of 3 mmHg.   Nuclear stress 03/09/2024:    The study is normal. The study is intermediate risk due to  depressed ejection fraction.   No ST deviation was noted.   Left ventricular function is abnormal. Nuclear stress EF: 46%. The left ventricular ejection fraction is mildly decreased (45-54%). End diastolic cavity size is mildly enlarged. End systolic cavity size is normal.   CT images were obtained for attenuation correction and were examined for the presence of coronary calcium  when appropriate.   Prior study not available for comparison.      )          Anesthesia Quick Evaluation

## 2024-04-12 NOTE — Progress Notes (Signed)
 Anesthesia Chart Review:  73 year old male follows with cardiology for history of OSA not on CPAP, HLD, elevated coronary artery calcium  score (585, 73rd percentile), PACs and PVCs as well as an episode of perioperative atrial fibrillation that occurred during/after shoulder surgery in 2018.  Echo 02/2018 showed normal biventricular function, normal valves.  He was seen by Dr. Kate on 03/04/2024 for preop evaluation.  Per note, Preop evaluation: Prior to hip surgery.  He denies anginal symptoms.  Calcium  score 585 on 11/2021, almost all in LAD.  Functional testing recommended for calcium  score above 400, recommend Lexiscan  Myoview  prior to surgery.  Nuclear stress test 03/09/2024 showed normal perfusion but was intermediate risk due to depressed EF 46%.  Echo 03/18/2024 showed LVEF 55 to 60%, hypokinetic septum, grade 1 DD, normal RV, mild dilatation of ascending aorta 42 mm.  Dr. Kate commented on results stating, Heart pumping function is normal.  Aorta is dilated, will monitor.  Otherwise no significant abnormalities. No further workup recommended prior to his surgery next month.   Clearance from PCP Dr. Mathews dated 04/04/2024 states patient cleared to proceed at low risk.  CMP and CBC 03/28/2024 in Care Everywhere reviewed, WNL.  EKG 03/04/2024: NSR.  Rate 75.  Minimal voltage criteria for LVH, may be normal variant.  Nonspecific T wave abnormality.   TTE 03/18/2024:  1. Mild septal hypokinesis. Left ventricular ejection fraction, by  estimation, is 55 to 60%. Left ventricular ejection fraction by 3D volume  is 56 %. The left ventricle has normal function. The left ventricle  demonstrates regional wall motion  abnormalities (see scoring diagram/findings for description). There is  mild concentric left ventricular hypertrophy. Left ventricular diastolic  parameters are consistent with Grade I diastolic dysfunction (impaired  relaxation). The average left  ventricular global longitudinal  strain is -20.7 %. The global longitudinal  strain is normal.   2. Right ventricular systolic function is normal. The right ventricular  size is normal. There is normal pulmonary artery systolic pressure.   3. The mitral valve is normal in structure. Trivial mitral valve  regurgitation. No evidence of mitral stenosis.   4. The aortic valve is tricuspid. Aortic valve regurgitation is not  visualized. No aortic stenosis is present.   5. Aortic dilatation noted. There is mild dilatation of the ascending  aorta, measuring 42 mm.   6. The inferior vena cava is normal in size with greater than 50%  respiratory variability, suggesting right atrial pressure of 3 mmHg.   Nuclear stress 03/09/2024:    The study is normal. The study is intermediate risk due to depressed ejection fraction.   No ST deviation was noted.   Left ventricular function is abnormal. Nuclear stress EF: 46%. The left ventricular ejection fraction is mildly decreased (45-54%). End diastolic cavity size is mildly enlarged. End systolic cavity size is normal.   CT images were obtained for attenuation correction and were examined for the presence of coronary calcium  when appropriate.   Prior study not available for comparison.      Lynwood Geofm RIGGERS [facility] Stay [facility]/Anesthesiology Phone [phone] 04/12/2024 10:49 AM

## 2024-04-13 ENCOUNTER — Other Ambulatory Visit (HOSPITAL_BASED_OUTPATIENT_CLINIC_OR_DEPARTMENT_OTHER)

## 2024-04-18 ENCOUNTER — Encounter (HOSPITAL_COMMUNITY): Payer: Self-pay | Admitting: Medical

## 2024-04-18 ENCOUNTER — Encounter (HOSPITAL_COMMUNITY): Admission: RE | Disposition: A | Payer: Self-pay | Source: Ambulatory Visit | Attending: Orthopedic Surgery

## 2024-04-18 ENCOUNTER — Ambulatory Visit (HOSPITAL_COMMUNITY)

## 2024-04-18 ENCOUNTER — Ambulatory Visit (HOSPITAL_COMMUNITY): Admitting: Anesthesiology

## 2024-04-18 ENCOUNTER — Ambulatory Visit (HOSPITAL_COMMUNITY)
Admission: RE | Admit: 2024-04-18 | Discharge: 2024-04-18 | Disposition: A | Discharge status: Home / Self Care | Source: Ambulatory Visit | Attending: Orthopedic Surgery | Admitting: Orthopedic Surgery

## 2024-04-18 ENCOUNTER — Other Ambulatory Visit: Payer: Self-pay

## 2024-04-18 ENCOUNTER — Encounter (HOSPITAL_COMMUNITY): Payer: Self-pay | Admitting: Orthopedic Surgery

## 2024-04-18 DIAGNOSIS — M1612 Unilateral primary osteoarthritis, left hip: Secondary | ICD-10-CM | POA: Diagnosis not present

## 2024-04-18 DIAGNOSIS — M160 Bilateral primary osteoarthritis of hip: Secondary | ICD-10-CM | POA: Diagnosis present

## 2024-04-18 DIAGNOSIS — I480 Paroxysmal atrial fibrillation: Secondary | ICD-10-CM | POA: Diagnosis not present

## 2024-04-18 DIAGNOSIS — G4733 Obstructive sleep apnea (adult) (pediatric): Secondary | ICD-10-CM | POA: Insufficient documentation

## 2024-04-18 DIAGNOSIS — M169 Osteoarthritis of hip, unspecified: Secondary | ICD-10-CM | POA: Diagnosis present

## 2024-04-18 HISTORY — PX: TOTAL HIP ARTHROPLASTY: SHX124

## 2024-04-18 LAB — TYPE AND SCREEN
ABO/RH(D): A POS
Antibody Screen: NEGATIVE

## 2024-04-18 SURGERY — ARTHROPLASTY, HIP, TOTAL, ANTERIOR APPROACH
Anesthesia: Spinal | Site: Hip | Laterality: Left

## 2024-04-18 MED ORDER — METOCLOPRAMIDE HCL 5 MG/ML IJ SOLN: 5.0000 mg | Freq: Three times a day (TID) | INTRAMUSCULAR | Status: DC | PRN

## 2024-04-18 MED ORDER — PHENYLEPHRINE HCL-NACL 20-0.9 MG/250ML-% IV SOLN
INTRAVENOUS | Status: AC
  Filled 2024-04-18: qty 250

## 2024-04-18 MED ORDER — CEFAZOLIN SODIUM-DEXTROSE 2-4 GM/100ML-% IV SOLN
2.0000 g | INTRAVENOUS | Status: AC
  Administered 2024-04-18: 2 g via INTRAVENOUS
  Filled 2024-04-18: qty 100

## 2024-04-18 MED ORDER — WATER FOR IRRIGATION, STERILE IR SOLN
Status: DC | PRN
  Administered 2024-04-18: 2000 mL

## 2024-04-18 MED ORDER — ASPIRIN 81 MG PO TBEC: 81.0000 mg | DELAYED_RELEASE_TABLET | Freq: Two times a day (BID) | ORAL | 0 refills | Status: AC

## 2024-04-18 MED ORDER — LACTATED RINGERS IV SOLN: INTRAVENOUS | Status: DC

## 2024-04-18 MED ORDER — OXYCODONE HCL 5 MG PO TABS: 5.0000 mg | ORAL_TABLET | ORAL | 0 refills | Status: AC | PRN

## 2024-04-18 MED ORDER — LACTATED RINGERS IV BOLUS
250.0000 mL | Freq: Once | INTRAVENOUS | Status: AC
  Administered 2024-04-18: 250 mL via INTRAVENOUS

## 2024-04-18 MED ORDER — 0.9 % SODIUM CHLORIDE (POUR BTL) OPTIME
TOPICAL | Status: DC | PRN
  Administered 2024-04-18: 1000 mL

## 2024-04-18 MED ORDER — ONDANSETRON HCL 4 MG/2ML IJ SOLN: 4.0000 mg | Freq: Four times a day (QID) | INTRAMUSCULAR | Status: DC | PRN

## 2024-04-18 MED ORDER — CEFAZOLIN SODIUM-DEXTROSE 2-4 GM/100ML-% IV SOLN: 2.0000 g | Freq: Four times a day (QID) | INTRAVENOUS | Status: DC

## 2024-04-18 MED ORDER — OXYCODONE HCL 5 MG/5ML PO SOLN: 5.0000 mg | Freq: Once | ORAL | Status: DC | PRN

## 2024-04-18 MED ORDER — HYDROMORPHONE HCL 1 MG/ML IJ SOLN: 0.5000 mg | INTRAMUSCULAR | Status: DC | PRN

## 2024-04-18 MED ORDER — ACETAMINOPHEN 10 MG/ML IV SOLN: 1000.0000 mg | Freq: Once | INTRAVENOUS | Status: DC | PRN

## 2024-04-18 MED ORDER — LACTATED RINGERS IV BOLUS
500.0000 mL | Freq: Once | INTRAVENOUS | Status: AC
  Administered 2024-04-18: 500 mL via INTRAVENOUS

## 2024-04-18 MED ORDER — TRAMADOL HCL 50 MG PO TABS: 50.0000 mg | ORAL_TABLET | Freq: Four times a day (QID) | ORAL | 0 refills | Status: AC | PRN

## 2024-04-18 MED ORDER — METHOCARBAMOL 500 MG PO TABS
500.0000 mg | ORAL_TABLET | Freq: Four times a day (QID) | ORAL | Status: DC | PRN
  Administered 2024-04-18: 500 mg via ORAL

## 2024-04-18 MED ORDER — FENTANYL CITRATE (PF) 100 MCG/2ML IJ SOLN
INTRAMUSCULAR | Status: DC | PRN
  Administered 2024-04-18: 100 ug via INTRAVENOUS

## 2024-04-18 MED ORDER — DROPERIDOL 2.5 MG/ML IJ SOLN: 0.6250 mg | Freq: Once | INTRAMUSCULAR | Status: DC | PRN

## 2024-04-18 MED ORDER — DEXAMETHASONE SOD PHOSPHATE PF 10 MG/ML IJ SOLN
8.0000 mg | Freq: Once | INTRAMUSCULAR | Status: AC
  Administered 2024-04-18: 8 mg via INTRAVENOUS

## 2024-04-18 MED ORDER — ONDANSETRON HCL 4 MG/2ML IJ SOLN
INTRAMUSCULAR | Status: DC | PRN
  Administered 2024-04-18: 4 mg via INTRAVENOUS

## 2024-04-18 MED ORDER — ONDANSETRON HCL 4 MG PO TABS: 4.0000 mg | ORAL_TABLET | Freq: Four times a day (QID) | ORAL | Status: DC | PRN

## 2024-04-18 MED ORDER — OXYCODONE HCL 5 MG PO TABS
5.0000 mg | ORAL_TABLET | ORAL | Status: DC | PRN
  Administered 2024-04-18: 10 mg via ORAL

## 2024-04-18 MED ORDER — BUPIVACAINE-EPINEPHRINE (PF) 0.25% -1:200000 IJ SOLN
INTRAMUSCULAR | Status: AC
  Filled 2024-04-18: qty 30

## 2024-04-18 MED ORDER — METHOCARBAMOL 500 MG PO TABS: 500.0000 mg | ORAL_TABLET | Freq: Four times a day (QID) | ORAL | 0 refills | Status: AC | PRN

## 2024-04-18 MED ORDER — TRAMADOL HCL 50 MG PO TABS: 50.0000 mg | ORAL_TABLET | Freq: Four times a day (QID) | ORAL | Status: DC | PRN

## 2024-04-18 MED ORDER — ONDANSETRON HCL 4 MG PO TABS: 4.0000 mg | ORAL_TABLET | Freq: Three times a day (TID) | ORAL | 0 refills | Status: AC | PRN

## 2024-04-18 MED ORDER — ACETAMINOPHEN 10 MG/ML IV SOLN
1000.0000 mg | Freq: Four times a day (QID) | INTRAVENOUS | Status: DC
  Administered 2024-04-18: 1000 mg via INTRAVENOUS
  Filled 2024-04-18: qty 100

## 2024-04-18 MED ORDER — METHOCARBAMOL 500 MG PO TABS
ORAL_TABLET | ORAL | Status: AC
  Filled 2024-04-18: qty 1

## 2024-04-18 MED ORDER — ORAL CARE MOUTH RINSE: 15.0000 mL | Freq: Once | OROMUCOSAL | Status: AC

## 2024-04-18 MED ORDER — FENTANYL CITRATE (PF) 50 MCG/ML IJ SOSY: 25.0000 ug | PREFILLED_SYRINGE | INTRAMUSCULAR | Status: DC | PRN

## 2024-04-18 MED ORDER — BUPIVACAINE-EPINEPHRINE (PF) 0.25% -1:200000 IJ SOLN
INTRAMUSCULAR | Status: DC | PRN
  Administered 2024-04-18: 30 mL

## 2024-04-18 MED ORDER — FENTANYL CITRATE (PF) 100 MCG/2ML IJ SOLN
INTRAMUSCULAR | Status: AC
  Filled 2024-04-18: qty 2

## 2024-04-18 MED ORDER — OXYCODONE HCL 5 MG PO TABS
ORAL_TABLET | ORAL | Status: AC
  Filled 2024-04-18: qty 2

## 2024-04-18 MED ORDER — MEPIVACAINE HCL (PF) 2 % IJ SOLN
INTRAMUSCULAR | Status: DC | PRN
  Administered 2024-04-18: 3 mL via INTRATHECAL

## 2024-04-18 MED ORDER — METOCLOPRAMIDE HCL 5 MG PO TABS: 5.0000 mg | ORAL_TABLET | Freq: Three times a day (TID) | ORAL | Status: DC | PRN

## 2024-04-18 MED ORDER — TRANEXAMIC ACID-NACL 1000-0.7 MG/100ML-% IV SOLN
1000.0000 mg | INTRAVENOUS | Status: AC
  Administered 2024-04-18: 1000 mg via INTRAVENOUS
  Filled 2024-04-18: qty 100

## 2024-04-18 MED ORDER — PHENYLEPHRINE HCL (PRESSORS) 10 MG/ML IV SOLN
INTRAVENOUS | Status: DC | PRN
  Administered 2024-04-18: 160 ug via INTRAVENOUS

## 2024-04-18 MED ORDER — METHOCARBAMOL 1000 MG/10ML IJ SOLN: 500.0000 mg | Freq: Four times a day (QID) | INTRAMUSCULAR | Status: DC | PRN

## 2024-04-18 MED ORDER — PROPOFOL 500 MG/50ML IV EMUL
INTRAVENOUS | Status: DC | PRN
  Administered 2024-04-18: 75 ug/kg/min via INTRAVENOUS

## 2024-04-18 MED ORDER — CHLORHEXIDINE GLUCONATE 0.12 % MT SOLN
15.0000 mL | Freq: Once | OROMUCOSAL | Status: AC
  Administered 2024-04-18: 15 mL via OROMUCOSAL

## 2024-04-18 SURGICAL SUPPLY — 35 items
BAG COUNTER SPONGE SURGICOUNT (BAG) IMPLANT
BAG ZIPLOCK 12X15 (MISCELLANEOUS) IMPLANT
BLADE SAG 18X100X1.27 (BLADE) ×1 IMPLANT
COVER PERINEAL POST (MISCELLANEOUS) ×1 IMPLANT
COVER SURGICAL LIGHT HANDLE (MISCELLANEOUS) ×1 IMPLANT
CUP ACET PINNACLE SECTR 56MM (Hips) IMPLANT
DERMABOND ADVANCED .7 DNX12 (GAUZE/BANDAGES/DRESSINGS) ×1 IMPLANT
DRAPE FOOT SWITCH (DRAPES) ×1 IMPLANT
DRAPE STERI IOBAN 125X83 (DRAPES) ×1 IMPLANT
DRAPE U-SHAPE 47X51 STRL (DRAPES) ×2 IMPLANT
DRSG AQUACEL AG ADV 3.5X10 (GAUZE/BANDAGES/DRESSINGS) ×1 IMPLANT
DURAPREP 26ML APPLICATOR (WOUND CARE) ×1 IMPLANT
ELECT REM PT RETURN 15FT ADLT (MISCELLANEOUS) ×1 IMPLANT
GLOVE BIO SURGEON STRL SZ 6.5 (GLOVE) IMPLANT
GLOVE BIO SURGEON STRL SZ7 (GLOVE) IMPLANT
GLOVE BIO SURGEON STRL SZ8 (GLOVE) ×1 IMPLANT
GLOVE BIOGEL PI IND STRL 7.0 (GLOVE) IMPLANT
GLOVE BIOGEL PI IND STRL 8 (GLOVE) ×1 IMPLANT
GOWN STRL REUS W/ TWL LRG LVL3 (GOWN DISPOSABLE) ×2 IMPLANT
HEAD CERAMIC 36 PLUS 8.5 12 14 (Hips) IMPLANT
HOLDER FOLEY CATH W/STRAP (MISCELLANEOUS) ×1 IMPLANT
KIT TURNOVER KIT A (KITS) ×1 IMPLANT
MANIFOLD NEPTUNE II (INSTRUMENTS) ×1 IMPLANT
PACK ANTERIOR HIP CUSTOM (KITS) ×1 IMPLANT
PENCIL SMOKE EVACUATOR COATED (MISCELLANEOUS) ×1 IMPLANT
PINNACLE ALTRX PLUS 4 N 36X56 (Hips) IMPLANT
SPIKE FLUID TRANSFER (MISCELLANEOUS) ×1 IMPLANT
STEM FEMORAL SZ 6MM STD ACTIS (Stem) IMPLANT
SUT ETHIBOND NAB CT1 #1 30IN (SUTURE) ×1 IMPLANT
SUT MNCRL AB 4-0 PS2 18 (SUTURE) ×1 IMPLANT
SUT VIC AB 2-0 CT1 TAPERPNT 27 (SUTURE) ×2 IMPLANT
SUTURE STRATFX 0 PDS 27 VIOLET (SUTURE) ×1 IMPLANT
TOWEL GREEN STERILE FF (TOWEL DISPOSABLE) ×1 IMPLANT
TRAY FOLEY MTR SLVR 16FR STAT (SET/KITS/TRAYS/PACK) ×1 IMPLANT
TUBE SUCTION HIGH CAP CLEAR NV (SUCTIONS) ×1 IMPLANT

## 2024-04-18 NOTE — Care Plan (Signed)
 Ortho Bundle Case Management Note  Patient Details  Name: IZMAEL DUROSS MRN: [id number] Date of Birth: 08/31/1950                  L THA on 04/18/24.  DCP: Home with wife.  DME: No needs. Has RW and cane.  PT: [facility]   DME Arranged:  N/A DME Agency:       Additional Comments: Please contact me with any questions of if this plan should need to change.    Lyle Pepper, CCM Case Manager for Dr Melodi Gaba [phone] ext [phone] 04/18/2024, 10:13 AM

## 2024-04-18 NOTE — Anesthesia Postprocedure Evaluation (Signed)
"   Anesthesia Post Note  Patient: Kevin Griffith  Procedure(s) Performed: ARTHROPLASTY, HIP, TOTAL, ANTERIOR APPROACH (Left: Hip)     Patient location during evaluation: PACU Anesthesia Type: Spinal Level of consciousness: oriented and awake and alert Pain management: pain level controlled Vital Signs Assessment: post-procedure vital signs reviewed and stable Respiratory status: spontaneous breathing, respiratory function stable and patient connected to nasal cannula oxygen Cardiovascular status: blood pressure returned to baseline and stable Postop Assessment: no headache, no backache and no apparent nausea or vomiting Anesthetic complications: no   No notable events documented.  Last Vitals:  Vitals:   04/18/24 1530 04/18/24 1546  BP: (!) 140/80 (!) 147/98  Pulse: 80 77  Resp: 19 20  Temp: 36.4 C 36.4 C  SpO2: 96% 97%    Last Pain:  Vitals:   04/18/24 1546  TempSrc: Oral  PainSc: 0-No pain                 Rome Ade      "

## 2024-04-18 NOTE — Op Note (Signed)
 "     OPERATIVE REPORT- TOTAL HIP ARTHROPLASTY   PREOPERATIVE DIAGNOSIS: Osteoarthritis of the Left hip.   POSTOPERATIVE DIAGNOSIS: Osteoarthritis of the Left  hip.   PROCEDURE: Left total hip arthroplasty, anterior approach.   SURGEON: Dempsey Moan, MD   ASSISTANT: Waddell Sor, PA-C  ANESTHESIA:  Spinal  ESTIMATED BLOOD LOSS:-250 mL    DRAINS: None  COMPLICATIONS: None   CONDITION: PACU - hemodynamically stable.   BRIEF CLINICAL NOTE: Kevin Griffith is a 73 y.o. male who has advanced end-  stage arthritis of their Left  hip with progressively worsening pain and  dysfunction.The patient has failed nonoperative management and presents for  total hip arthroplasty.   PROCEDURE IN DETAIL: After successful administration of spinal  anesthetic, the traction boots for the [facility] bed were placed on both  feet and the patient was placed onto the [facility] bed, boots placed into the leg  holders. The Left hip was then isolated from the perineum with plastic  drapes and prepped and draped in the usual sterile fashion. ASIS and  greater trochanter were marked and a oblique incision was made, starting  at about 1 cm lateral and 2 cm distal to the ASIS and coursing towards  the anterior cortex of the femur. The skin was cut with a 10 blade  through subcutaneous tissue to the level of the fascia overlying the  tensor fascia lata muscle. The fascia was then incised in line with the  incision at the junction of the anterior third and posterior 2/3rd. The  muscle was teased off the fascia and then the interval between the TFL  and the rectus was developed. The Hohmann retractor was then placed at  the top of the femoral neck over the capsule. The vessels overlying the  capsule were cauterized and the fat on top of the capsule was removed.  A Hohmann retractor was then placed anterior underneath the rectus  femoris to give exposure to the entire anterior capsule. A T-shaped  capsulotomy was  performed. The edges were tagged and the femoral head  was identified.       Osteophytes are removed off the superior acetabulum.  The femoral neck was then cut in situ with an oscillating saw. Traction  was then applied to the left lower extremity utilizing the [facility]  traction. The femoral head was then removed. Retractors were placed  around the acetabulum and then circumferential removal of the labrum was  performed. Osteophytes were also removed. Reaming starts at 49 mm to  medialize and  Increased in 2 mm increments to 55 mm. We reamed in  approximately 40 degrees of abduction, 20 degrees anteversion. A 56 mm  pinnacle acetabular shell was then impacted in anatomic position under  fluoroscopic guidance with excellent purchase. We did not need to place  any additional dome screws. A 36 mm neutral + 4 Altrx liner was then  placed into the acetabular shell.       The femoral lift was then placed along the lateral aspect of the femur  just distal to the vastus ridge. The leg was  externally rotated and capsule  was stripped off the inferior aspect of the femoral neck down to the  level of the lesser trochanter, this was done with electrocautery. The femur was lifted after this was performed. The  leg was then placed in an extended and adducted position essentially delivering the femur. We also removed the capsule superiorly and the piriformis from the piriformis fossa  to gain excellent exposure of the  proximal femur. Rongeur was used to remove some cancellous bone to get  into the lateral portion of the proximal femur for placement of the  initial starter reamer. The starter broaches was placed  the starter broach  and was shown to go down the center of the canal. Broaching  with the Actis system was then performed starting at size 0  coursing  Up to size 6. A size 6 had excellent torsional and rotational  and axial stability. The trial standard offset neck was then placed  with a 36 + 8.5  trial head. The hip was then reduced. We confirmed that  the stem was in the canal both on AP and lateral x-rays. It also has excellent sizing. The hip was reduced with outstanding stability through full extension and full external rotation.. AP pelvis was taken and the leg lengths were measured and found to be equal. Hip was then dislocated again and the femoral head and neck removed. The  femoral broach was removed. Size 6 Actis stem with a standard offset  neck was then impacted into the femur following native anteversion. Has  excellent purchase in the canal. Excellent torsional and rotational and  axial stability. It is confirmed to be in the canal on AP and lateral  fluoroscopic views. The 36 + 8.5 ceramic head was placed and the hip  reduced with outstanding stability. Again AP pelvis was taken and it  confirmed that the leg lengths were equal. The wound was then copiously  irrigated with saline solution and the capsule reattached and repaired  with Ethibond suture. 30 ml of .25% Bupivicaine was  injected into the capsule and into the edge of the tensor fascia lata as well as subcutaneous tissue. The fascia overlying the tensor fascia lata was then closed with a running #1 V-Loc. Subcu was closed with interrupted 2-0 Vicryl and subcuticular running 4-0 Monocryl. Incision was cleaned  and dried. Steri-Strips and a bulky sterile dressing applied. The patient was awakened and transported to  recovery in stable condition.        Please note that a surgical assistant was a medical necessity for this procedure to perform it in a safe and expeditious manner. Assistant was necessary to provide appropriate retraction of vital neurovascular structures and to prevent femoral fracture and allow for anatomic placement of the prosthesis.  Dempsey Moan, M.D.    "

## 2024-04-18 NOTE — Anesthesia Procedure Notes (Signed)
 Spinal  Patient location during procedure: OR Start time: 04/18/2024 12:18 PM End time: 04/18/2024 12:21 PM Reason for block: surgical anesthesia  Staffing Performed: anesthesiologist  Authorized by: Boone Fess, MD   Performed by: Boone Fess, MD  Preanesthetic Checklist Completed: patient identified, IV checked, site marked, risks and benefits discussed, surgical consent, monitors and equipment checked, pre-op evaluation and timeout performed Spinal Block Patient position: sitting Prep: ChloraPrep and site prepped and draped Patient monitoring: heart rate, continuous pulse ox, blood pressure and cardiac monitor Approach: midline Location: L3-4 Injection technique: single-shot Needle Needle type: Quincke  Needle gauge: 22 G Needle length: 9 cm Assessment Sensory level: T10 Events: CSF return  Additional Notes Meticulous sterile technique used throughout (CHG prep, sterile gloves, sterile drape). Negative paresthesia. Negative blood return. Positive free-flowing CSF. Expiration date of kit checked and confirmed. Patient tolerated procedure well, without complications.

## 2024-04-18 NOTE — Transfer of Care (Signed)
 Immediate Anesthesia Transfer of Care Note  Patient: Kevin Griffith  Procedure(s) Performed: ARTHROPLASTY, HIP, TOTAL, ANTERIOR APPROACH (Left: Hip)  Patient Location: PACU  Anesthesia Type:Spinal  Level of Consciousness: sedated, patient cooperative, and responds to stimulation  Airway & Oxygen Therapy: Patient Spontanous Breathing and Patient connected to face mask oxygen  Post-op Assessment: Report given to RN and Post -op Vital signs reviewed and stable  Post vital signs: Reviewed and stable  Last Vitals:  Vitals Value Taken Time  BP 123/74 04/18/24 14:30  Temp    Pulse 85 04/18/24 14:32  Resp 20 04/18/24 14:32  SpO2 97 % 04/18/24 14:32  Vitals shown include unfiled device data.  Last Pain:  Vitals:   04/18/24 1029  TempSrc: Oral  PainSc: 3          Complications: No notable events documented.

## 2024-04-18 NOTE — Interval H&P Note (Signed)
 History and Physical Interval Note:  04/18/2024 9:45 AM  Kevin Griffith  has presented today for surgery, with the diagnosis of Left hip osteoarthritis.  The various methods of treatment have been discussed with the patient and family. After consideration of risks, benefits and other options for treatment, the patient has consented to  Procedures: ARTHROPLASTY, HIP, TOTAL, ANTERIOR APPROACH (Left) as a surgical intervention.  The patient's history has been reviewed, patient examined, no change in status, stable for surgery.  I have reviewed the patient's chart and labs.  Questions were answered to the patient's satisfaction.     Kevin Griffith

## 2024-04-18 NOTE — Discharge Instructions (Addendum)
Ollen Gross, MD Total Joint Specialist [facility] [address], [address] [place], [place] [address] [phone]  ANTERIOR APPROACH TOTAL HIP REPLACEMENT POSTOPERATIVE DIRECTIONS     Hip Rehabilitation, Guidelines Following Surgery  The results of a hip operation are greatly improved after range of motion and muscle strengthening exercises. Follow all safety measures which are given to protect your hip. If any of these exercises cause increased pain or swelling in your joint, decrease the amount until you are comfortable again. Then slowly increase the exercises. Call your caregiver if you have problems or questions.   BLOOD CLOT PREVENTION Take an 81 mg Aspirin two times a day for three weeks following surgery. Then resume one 81 mg Aspirin once a day. You may resume your vitamins/supplements upon discharge from the hospital. Do not take any NSAIDs (Advil, Aleve, Ibuprofen, Meloxicam, etc.) until you are 3 weeks out from surgery  HOME CARE INSTRUCTIONS  Remove items at home which could result in a fall. This includes throw rugs or furniture in walking pathways.  ICE to the affected hip as frequently as 20-30 minutes an hour and then as needed for pain and swelling. Continue to use ice on the hip for pain and swelling from surgery. You may notice swelling that will progress down to the foot and ankle. This is normal after surgery. Elevate the leg when you are not up walking on it.   Continue to use the breathing machine which will help keep your temperature down.  It is common for your temperature to cycle up and down following surgery, especially at night when you are not up moving around and exerting yourself.  The breathing machine keeps your lungs expanded and your temperature down.  DIET You may resume your previous home diet once your are discharged from the hospital.  DRESSING / WOUND CARE / SHOWERING You have an adhesive waterproof bandage over the  incision. Leave this in place until your first follow-up appointment. Once you remove this you will not need to place another bandage.  You may begin showering 3 days following surgery, but do not submerge the incision under water.  ACTIVITY For the first 3-5 days, it is important to rest and keep the operative leg elevated. You should, as a general rule, rest for 50 minutes and walk/stretch for 10 minutes per hour. After 5 days, you may slowly increase activity as tolerated.  Perform the exercises you were provided twice a day for about 15-20 minutes each session. Begin these 2 days following surgery. Walk with your walker as instructed. Use the walker until you are comfortable transitioning to a cane. Walk with the cane in the opposite hand of the operative leg. You may discontinue the cane once you are comfortable and walking steadily. Avoid periods of inactivity such as sitting longer than an hour when not asleep. This helps prevent blood clots.  Do not drive a car for 6 weeks or until released by your surgeon.  Do not drive while taking narcotics.  TED HOSE STOCKINGS Wear the elastic stockings on both legs for three weeks following surgery during the day. You may remove them at night while sleeping.  WEIGHT BEARING Weight bearing as tolerated with assist device (walker, cane, etc) as directed, use it as long as suggested by your surgeon or therapist, typically at least 4-6 weeks.  POSTOPERATIVE CONSTIPATION PROTOCOL Constipation - defined medically as fewer than three stools per week and severe constipation as less than one stool per week.  less than one stool per week.  One of the most common issues patients have following surgery is constipation.  Even if you have a regular bowel pattern at home, your normal regimen is likely to be disrupted due to multiple reasons following surgery.  Combination of anesthesia, postoperative narcotics, change in appetite and fluid intake all can  affect your bowels.  In order to avoid complications following surgery, here are some recommendations in order to help you during your recovery period.  Colace (docusate) - Pick up an over-the-counter form of Colace or another stool softener and take twice a day as long as you are requiring postoperative pain medications.  Take with a full glass of water daily.  If you experience loose stools or diarrhea, hold the colace until you stool forms back up.  If your symptoms do not get better within 1 week or if they get worse, check with your doctor. Dulcolax (bisacodyl) - Pick up over-the-counter and take as directed by the product packaging as needed to assist with the movement of your bowels.  Take with a full glass of water.  Use this product as needed if not relieved by Colace only.  MiraLax (polyethylene glycol) - Pick up over-the-counter to have on hand.  MiraLax is a solution that will increase the amount of water in your bowels to assist with bowel movements.  Take as directed and can mix with a glass of water, juice, soda, coffee, or tea.  Take if you go more than two days without a movement.Do not use MiraLax more than once per day. Call your doctor if you are still constipated or irregular after using this medication for 7 days in a row.  If you continue to have problems with postoperative constipation, please contact the office for further assistance and recommendations.  If you experience "the worst abdominal pain ever" or develop nausea or vomiting, please contact the office immediatly for further recommendations for treatment.  ITCHING  If you experience itching with your medications, try taking only a single pain pill, or even half a pain pill at a time.  You can also use Benadryl over the counter for itching or also to help with sleep.   MEDICATIONS See your medication summary on the "After Visit Summary" that the nursing staff will review with you prior to discharge.  You may have some home  medications which will be placed on hold until you complete the course of blood thinner medication.  It is important for you to complete the blood thinner medication as prescribed by your surgeon.  Continue your approved medications as instructed at time of discharge.  PRECAUTIONS If you experience chest pain or shortness of breath - call 911 immediately for transfer to the hospital emergency department.  If you develop a fever greater that 101 F, purulent drainage from wound, increased redness or drainage from wound, foul odor from the wound/dressing, or calf pain - CONTACT YOUR SURGEON.                                                   FOLLOW-UP APPOINTMENTS Make sure you keep all of your appointments after your operation with your surgeon and caregivers. You should call the office at the above phone number and make an appointment for approximately two weeks after the date of your surgery or on the   date instructed by your surgeon outlined in the "After Visit Summary".  RANGE OF MOTION AND STRENGTHENING EXERCISES  These exercises are designed to help you keep full movement of your hip joint. Follow your caregiver's or physical therapist's instructions. Perform all exercises about fifteen times, three times per day or as directed. Exercise both hips, even if you have had only one joint replacement. These exercises can be done on a training (exercise) mat, on the floor, on a table or on a bed. Use whatever works the best and is most comfortable for you. Use music or television while you are exercising so that the exercises are a pleasant break in your day. This will make your life better with the exercises acting as a break in routine you can look forward to.  Lying on your back, slowly slide your foot toward your buttocks, raising your knee up off the floor. Then slowly slide your foot back down until your leg is straight again.  Lying on your back spread your legs as far apart as you can without causing  discomfort.  Lying on your side, raise your upper leg and foot straight up from the floor as far as is comfortable. Slowly lower the leg and repeat.  Lying on your back, tighten up the muscle in the front of your thigh (quadriceps muscles). You can do this by keeping your leg straight and trying to raise your heel off the floor. This helps strengthen the largest muscle supporting your knee.  Lying on your back, tighten up the muscles of your buttocks both with the legs straight and with the knee bent at a comfortable angle while keeping your heel on the floor.   POST-OPERATIVE OPIOID TAPER INSTRUCTIONS: It is important to wean off of your opioid medication as soon as possible. If you do not need pain medication after your surgery it is ok to stop day one. Opioids include: Codeine, Hydrocodone(Norco, Vicodin), Oxycodone(Percocet, oxycontin) and hydromorphone amongst others.  Long term and even short term use of opiods can cause: Increased pain response Dependence Constipation Depression Respiratory depression And more.  Withdrawal symptoms can include Flu like symptoms Nausea, vomiting And more Techniques to manage these symptoms Hydrate well Eat regular healthy meals Stay active Use relaxation techniques(deep breathing, meditating, yoga) Do Not substitute Alcohol to help with tapering If you have been on opioids for less than two weeks and do not have pain than it is ok to stop all together.  Plan to wean off of opioids This plan should start within one week post op of your joint replacement. Maintain the same interval or time between taking each dose and first decrease the dose.  Cut the total daily intake of opioids by one tablet each day Next start to increase the time between doses. The last dose that should be eliminated is the evening dose.   IF YOU ARE TRANSFERRED TO A SKILLED REHAB FACILITY If the patient is transferred to a skilled rehab facility following release from the  hospital, a list of the current medications will be sent to the facility for the patient to continue.  When discharged from the skilled rehab facility, please have the facility set up the patient's Home Health Physical Therapy prior to being released. Also, the skilled facility will be responsible for providing the patient with their medications at time of release from the facility to include their pain medication, the muscle relaxants, and their blood thinner medication. If the patient is still at the rehab facility   up appointment, the skilled rehab facility will also need to assist the patient in arranging follow up appointment in our office and any transportation needs.  MAKE SURE YOU:  Understand these instructions.  Get help right away if you are not doing well or get worse.    DENTAL ANTIBIOTICS:  In most cases prophylactic antibiotics for Dental procdeures after total joint surgery are not necessary.  Exceptions are as follows:  1. History of prior total joint infection  2. Severely immunocompromised (Organ Transplant, cancer chemotherapy, Rheumatoid biologic meds such as Humera)  3. Poorly controlled diabetes (A1C &gt; 8.0, blood glucose over 200)  If you have one of these conditions, contact your surgeon for an antibiotic prescription, prior to your dental procedure.    Pick up stool softner and laxative for home use following surgery while on pain medications. Do not submerge incision under water. Please use good hand washing techniques while changing dressing each day. May shower starting three days after surgery. Please use a clean towel to pat the incision dry following showers. Continue to use ice for pain and swelling after surgery. Do not use any lotions or creams on the incision until instructed by your surgeon.  

## 2024-04-19 ENCOUNTER — Encounter (HOSPITAL_COMMUNITY): Payer: Self-pay | Admitting: Orthopedic Surgery

## 2024-07-05 ENCOUNTER — Ambulatory Visit: Admitting: Cardiology
